# Patient Record
Sex: Female | Born: 1945 | Race: White | Hispanic: No | Marital: Married | Smoking: Never smoker
Health system: Southern US, Community
[De-identification: ages and names within clinical notes are randomized; demographics above are authoritative.]

## PROBLEM LIST (undated history)

## (undated) DIAGNOSIS — G96191 Perineural cyst: Secondary | ICD-10-CM

## (undated) DIAGNOSIS — I10 Essential (primary) hypertension: Secondary | ICD-10-CM

## (undated) DIAGNOSIS — M1990 Unspecified osteoarthritis, unspecified site: Secondary | ICD-10-CM

## (undated) DIAGNOSIS — E119 Type 2 diabetes mellitus without complications: Secondary | ICD-10-CM

## (undated) DIAGNOSIS — M659 Synovitis and tenosynovitis, unspecified: Secondary | ICD-10-CM

## (undated) DIAGNOSIS — K219 Gastro-esophageal reflux disease without esophagitis: Secondary | ICD-10-CM

## (undated) DIAGNOSIS — E0590 Thyrotoxicosis, unspecified without thyrotoxic crisis or storm: Secondary | ICD-10-CM

## (undated) DIAGNOSIS — Z96641 Presence of right artificial hip joint: Secondary | ICD-10-CM

## (undated) DIAGNOSIS — E538 Deficiency of other specified B group vitamins: Secondary | ICD-10-CM

## (undated) DIAGNOSIS — M25551 Pain in right hip: Secondary | ICD-10-CM

## (undated) DIAGNOSIS — M47816 Spondylosis without myelopathy or radiculopathy, lumbar region: Secondary | ICD-10-CM

## (undated) DIAGNOSIS — G548 Other nerve root and plexus disorders: Secondary | ICD-10-CM

## (undated) DIAGNOSIS — G8929 Other chronic pain: Secondary | ICD-10-CM

## (undated) DIAGNOSIS — M65959 Unspecified synovitis and tenosynovitis, unspecified thigh: Secondary | ICD-10-CM

## (undated) DIAGNOSIS — K5900 Constipation, unspecified: Secondary | ICD-10-CM

## (undated) HISTORY — PX: ELBOW SURGERY: SHX618

## (undated) HISTORY — PX: TOTAL HIP ARTHROPLASTY: SHX124

## (undated) HISTORY — PX: OTHER SURGICAL HISTORY: SHX169

## (undated) HISTORY — PX: VAGINAL HYSTERECTOMY: SUR661

## (undated) HISTORY — PX: HIP SURGERY: SHX245

---

## 1998-10-25 ENCOUNTER — Other Ambulatory Visit: Admission: RE | Admit: 1998-10-25 | Discharge: 1998-10-25 | Payer: Self-pay | Admitting: Obstetrics and Gynecology

## 1999-11-02 ENCOUNTER — Other Ambulatory Visit: Admission: RE | Admit: 1999-11-02 | Discharge: 1999-11-02 | Payer: Self-pay | Admitting: Obstetrics and Gynecology

## 2000-10-24 ENCOUNTER — Other Ambulatory Visit: Admission: RE | Admit: 2000-10-24 | Discharge: 2000-10-24 | Payer: Self-pay | Admitting: Obstetrics and Gynecology

## 2002-01-15 ENCOUNTER — Other Ambulatory Visit: Admission: RE | Admit: 2002-01-15 | Discharge: 2002-01-15 | Payer: Self-pay | Admitting: Obstetrics and Gynecology

## 2003-02-08 ENCOUNTER — Other Ambulatory Visit: Admission: RE | Admit: 2003-02-08 | Discharge: 2003-02-08 | Payer: Self-pay | Admitting: Obstetrics and Gynecology

## 2003-10-25 ENCOUNTER — Ambulatory Visit (HOSPITAL_BASED_OUTPATIENT_CLINIC_OR_DEPARTMENT_OTHER): Admission: RE | Admit: 2003-10-25 | Discharge: 2003-10-25 | Payer: Self-pay | Admitting: Orthopedic Surgery

## 2003-10-25 ENCOUNTER — Ambulatory Visit (HOSPITAL_COMMUNITY): Admission: RE | Admit: 2003-10-25 | Discharge: 2003-10-25 | Payer: Self-pay | Admitting: Orthopedic Surgery

## 2004-05-23 ENCOUNTER — Other Ambulatory Visit: Admission: RE | Admit: 2004-05-23 | Discharge: 2004-05-23 | Payer: Self-pay | Admitting: Obstetrics and Gynecology

## 2010-03-03 ENCOUNTER — Emergency Department (HOSPITAL_BASED_OUTPATIENT_CLINIC_OR_DEPARTMENT_OTHER): Admission: EM | Admit: 2010-03-03 | Discharge: 2010-03-04 | Payer: Self-pay | Admitting: Emergency Medicine

## 2010-03-03 ENCOUNTER — Ambulatory Visit: Payer: Self-pay | Admitting: Diagnostic Radiology

## 2011-05-04 NOTE — Op Note (Signed)
Shelby Robinson, Shelby Robinson                           ACCOUNT NO.:  [id number]   MEDICAL RECORD NO.:  [id number]                   PATIENT TYPE:  AMB   LOCATION:  DSC                                  FACILITY:  [facility]   PHYSICIAN:  Robert A. Thurston Hole, M.D.              DATE OF BIRTH:  01-20-1946   DATE OF ADMISSION:  10/25/2003  DATE OF DISCHARGE:                                 OPERATIVE REPORT   PREOPERATIVE DIAGNOSIS:  Right elbow lateral epicondylitis with partial tear  ECRV/ECRL tendon.   POSTOPERATIVE DIAGNOSIS:  Right elbow lateral epicondylitis with partial  tear ECRV/ECRL tendon.   PROCEDURE:  Right elbow EUA followed by lateral epicondylar release,  debridement, and repair with partial epicondylectomy.   SURGEON:  Elana Alm. Thurston Hole, M.D.   ASSISTANT:  Julien Girt, P.A.   ANESTHESIA:  General.   OPERATIVE TIME:  45 minutes.   COMPLICATIONS:  None.   INDICATIONS FOR PROCEDURE:  The patient is a 65 year old woman who has had  significant right elbow pain for the past six to eight months, increasing in  nature, with signs and symptoms and MRI documenting partial tear of the  lateral epicondylar tendons with tendonitis who has failed conservative care  and is now to undergo lateral epicondylar release, debridement, and repair.   DESCRIPTION OF PROCEDURE:  The patient is brought to the operating room on  October 25, 2003, and placed on the operating table in the supine position.  After an adequate level of general anesthesia was obtained, her right elbow  was examined under anesthesia.  She had full range of motion and her elbow  was stable to ligamentous examination.  Her right arm was prepped using  sterile Duraprep and draped using sterile technique.  Her arm was  exsanguinated and a tourniquet elevated to 250 mmHg.  Initially through a 3  cm curvilinear incision based over the lateral epicondyle, initial exposure  was made.  The underlying subcutaneous tissues were  incised in line with the  skin incision.  The fascia over the lateral epicondyle was incised and  retracted.  Lateral epicondylar tendons, ECRV and ECRL tendons were  carefully dissected off the lateral epicondyle with partial tearing noted as  the undersurface was visualized and this was thoroughly debrided back to  healthy tissue.  Radial nerve carefully protected.  The radial head  capitellum joint was not entered.  After debridement was carried out, there  was found to be a small spur along the lateral epicondyle and this was  resected.  Multiple drill holes were then placed in the lateral epicondyle  and then sequentially using 2-0 Fiberwire, the lateral epicondylar tendons  were reattached to the lateral epicondyle approximately 3 to 4 mm distal to  their initial attachment site through these drill holes and the bone and  tied down.  This significantly reduced the tension on the tendons and  performed a satisfactory repair.  After this was done, the wound was  irrigated and the fascia was closed over this with 2-0 Vicryl, subcutaneous  tissues closed with 2-0 Vicryl, subcuticular layer closed with 3-0 Prolene.  Steri-Strips were applied.  The wound injected with 0.25% Marcaine with  epinephrine.  Sterile dressings and a long arm splint applied.  The  tourniquet was released and the patient awakened and taken to the recovery  room in stable condition.   FOLLOW UP:  The patient will be followed as an outpatient on Percocet and  Naprosyn.  See her back in the office in a week for sutures out and follow-  up.                                                Robert A. Thurston Hole, M.D.    RAW/MEDQ  D:  10/25/2003  T:  10/25/2003  Job:  [id number]

## 2013-04-20 ENCOUNTER — Emergency Department (HOSPITAL_BASED_OUTPATIENT_CLINIC_OR_DEPARTMENT_OTHER)
Admission: EM | Admit: 2013-04-20 | Discharge: 2013-04-20 | Disposition: A | Discharge status: Home / Self Care | Payer: Medicare Other | Attending: Emergency Medicine | Admitting: Emergency Medicine

## 2013-04-20 ENCOUNTER — Encounter (HOSPITAL_BASED_OUTPATIENT_CLINIC_OR_DEPARTMENT_OTHER): Payer: Self-pay

## 2013-04-20 DIAGNOSIS — R350 Frequency of micturition: Secondary | ICD-10-CM | POA: Insufficient documentation

## 2013-04-20 DIAGNOSIS — N3090 Cystitis, unspecified without hematuria: Secondary | ICD-10-CM | POA: Insufficient documentation

## 2013-04-20 DIAGNOSIS — E119 Type 2 diabetes mellitus without complications: Secondary | ICD-10-CM | POA: Insufficient documentation

## 2013-04-20 DIAGNOSIS — Z79899 Other long term (current) drug therapy: Secondary | ICD-10-CM | POA: Insufficient documentation

## 2013-04-20 DIAGNOSIS — R3915 Urgency of urination: Secondary | ICD-10-CM | POA: Insufficient documentation

## 2013-04-20 DIAGNOSIS — N3091 Cystitis, unspecified with hematuria: Secondary | ICD-10-CM

## 2013-04-20 DIAGNOSIS — Z87440 Personal history of urinary (tract) infections: Secondary | ICD-10-CM | POA: Insufficient documentation

## 2013-04-20 DIAGNOSIS — I10 Essential (primary) hypertension: Secondary | ICD-10-CM | POA: Insufficient documentation

## 2013-04-20 HISTORY — DX: Essential (primary) hypertension: I10

## 2013-04-20 HISTORY — DX: Type 2 diabetes mellitus without complications: E11.9

## 2013-04-20 LAB — URINALYSIS, ROUTINE W REFLEX MICROSCOPIC
Ketones, ur: NEGATIVE mg/dL
Protein, ur: 30 mg/dL — AB
pH: 6.5 (ref 5.0–8.0)

## 2013-04-20 LAB — URINE MICROSCOPIC-ADD ON

## 2013-04-20 MED ORDER — CIPROFLOXACIN HCL 500 MG PO TABS: 500.0000 mg | ORAL_TABLET | Freq: Two times a day (BID) | ORAL | Status: DC

## 2013-04-20 MED ORDER — PHENAZOPYRIDINE HCL 100 MG PO TABS
200.0000 mg | ORAL_TABLET | Freq: Once | ORAL | Status: AC
  Administered 2013-04-20: 100 mg via ORAL
  Filled 2013-04-20: qty 1

## 2013-04-20 MED ORDER — PHENAZOPYRIDINE HCL 200 MG PO TABS: 200.0000 mg | ORAL_TABLET | Freq: Three times a day (TID) | ORAL | Status: DC

## 2013-04-20 MED ORDER — CIPROFLOXACIN HCL 500 MG PO TABS
500.0000 mg | ORAL_TABLET | Freq: Once | ORAL | Status: AC
  Administered 2013-04-20: 500 mg via ORAL
  Filled 2013-04-20: qty 1

## 2013-04-20 NOTE — ED Provider Notes (Signed)
History     CSN: [id number]  Arrival date & time 04/20/13  0325   First MD Initiated Contact with Patient 04/20/13 0401      Chief Complaint  Patient presents with  . Dysuria    (Consider location/radiation/quality/duration/timing/severity/associated sxs/prior treatment) HPI This is a 67 year old female with urinary tract infection symptoms that began this morning about midnight. Specifically she is having urinary urgency, urinary frequency and burning with urination. The symptoms are moderate in severity. She has not taken anything to treat the symptoms. She denies fever, chills, nausea or vomiting. She has had hematuria and back pain. She has had urinary tract infections in the past but never with this amount of hematuria.  Past Medical History  Diagnosis Date  . Diabetes mellitus without complication   . UTI (lower urinary tract infection)   . Hypertension     Past Surgical History  Procedure Laterality Date  . Hip surgery      No family history on file.  History  Substance Use Topics  . Smoking status: Never Smoker   . Smokeless tobacco: Not on file  . Alcohol Use: Not on file    OB History   Grav Para Term Preterm Abortions TAB SAB Ect Mult Living                  Review of Systems  All other systems reviewed and are negative.    Allergies  Flagyl; Codeine; and Other  Home Medications   Current Outpatient Rx  Name  Route  Sig  Dispense  Refill  . amLODipine (NORVASC) 2.5 MG tablet   Oral   Take 2.5 mg by mouth daily.         Marland Kitchen atorvastatin (LIPITOR) 10 MG tablet   Oral   Take 10 mg by mouth daily.         . famotidine (PEPCID) 40 MG tablet   Oral   Take 40 mg by mouth daily.         . hydrochlorothiazide (HYDRODIURIL) 25 MG tablet   Oral   Take 25 mg by mouth daily.         . lansoprazole (PREVACID) 30 MG capsule   Oral   Take 30 mg by mouth daily.         . metFORMIN (GLUMETZA) 500 MG (MOD) 24 hr tablet   Oral   Take 500 mg  by mouth 2 (two) times daily with a meal.         . metoprolol (TOPROL-XL) 200 MG 24 hr tablet   Oral   Take 200 mg by mouth daily.         Marland Kitchen rOPINIRole (REQUIP) 0.25 MG tablet   Oral   Take 0.25 mg by mouth 3 (three) times daily.         . traMADol (ULTRAM) 50 MG tablet   Oral   Take 50 mg by mouth every 6 (six) hours as needed for pain.           BP 121/86  Pulse 79  Temp(Src) 97.9 F (36.6 C) (Oral)  SpO2 95%  Physical Exam General: Well-developed, well-nourished female in no acute distress; appearance consistent with age of record HENT: normocephalic, atraumatic Eyes: pupils equal round and reactive to light; extraocular muscles intact Neck: supple Heart: regular rate and rhythm Lungs: clear to auscultation bilaterally Abdomen: soft; nondistended; mild suprapubic tenderness; bowel sounds present GU: Mild CVA tenderness bilaterally Extremities: No deformity; full range of motion; pulses normal  Neurologic: Awake, alert and oriented; motor function intact in all extremities and symmetric; no facial droop Skin: Warm and dry Psychiatric: Normal mood and affect    ED Course  Procedures (including critical care time)     MDM   Nursing notes and vitals signs, including pulse oximetry, reviewed.  Summary of this visit's results, reviewed by myself:  Labs:  Results for orders placed during the hospital encounter of 04/20/13 (from the past 24 hour(s))  URINALYSIS, ROUTINE W REFLEX MICROSCOPIC     Status: Abnormal   Collection Time    04/20/13  3:35 AM      Result Value Range   Color, Urine YELLOW  YELLOW   APPearance CLOUDY (*) CLEAR   Specific Gravity, Urine 1.004 (*) 1.005 - 1.030   pH 6.5  5.0 - 8.0   Glucose, UA NEGATIVE  NEGATIVE mg/dL   Hgb urine dipstick LARGE (*) NEGATIVE   Bilirubin Urine NEGATIVE  NEGATIVE   Ketones, ur NEGATIVE  NEGATIVE mg/dL   Protein, ur 30 (*) NEGATIVE mg/dL   Urobilinogen, UA 0.2  0.0 - 1.0 mg/dL   Nitrite NEGATIVE   NEGATIVE   Leukocytes, UA LARGE (*) NEGATIVE  URINE MICROSCOPIC-ADD ON     Status: Abnormal   Collection Time    04/20/13  3:35 AM      Result Value Range   Squamous Epithelial / LPF FEW (*) RARE   WBC, UA TOO NUMEROUS TO COUNT  <3 WBC/hpf   RBC / HPF TOO NUMEROUS TO COUNT  <3 RBC/hpf   Bacteria, UA MANY (*) RARE             Hanley Seamen, MD 04/20/13 [phone]

## 2013-04-20 NOTE — ED Notes (Signed)
Patient states that she developed urinary urgency, pressure and hematuria at midnight tonight. Has not felt well the past 2 days but bladder pain did not start until today, hx of same. Also complains of lower backpain

## 2013-04-20 NOTE — ED Notes (Signed)
MD at bedside. 

## 2013-04-22 LAB — URINE CULTURE: Colony Count: >=100000

## 2013-04-23 NOTE — ED Notes (Signed)
Post ED Visit - Positive Culture Follow-up  Culture report reviewed by antimicrobial stewardship pharmacist: []  Wes Dulaney, Pharm.D., BCPS [x]  Celedonio Miyamoto, Pharm.D., BCPS []  Georgina Pillion, Pharm.D., BCPS []  [place], [address].D., BCPS, AAHIVP []  Estella Husk, Pharm.D., BCPS, AAHIV  Positive urine culture Treated with cipro- organism sensitive to the same and no further patient follow-up is required at this time.  Larena Sox 04/23/2013, 6:36 PM

## 2015-02-16 ENCOUNTER — Encounter (HOSPITAL_BASED_OUTPATIENT_CLINIC_OR_DEPARTMENT_OTHER): Payer: Self-pay | Admitting: *Deleted

## 2015-02-16 ENCOUNTER — Emergency Department (HOSPITAL_BASED_OUTPATIENT_CLINIC_OR_DEPARTMENT_OTHER): Payer: Medicare Other

## 2015-02-16 ENCOUNTER — Emergency Department (HOSPITAL_BASED_OUTPATIENT_CLINIC_OR_DEPARTMENT_OTHER)
Admission: EM | Admit: 2015-02-16 | Discharge: 2015-02-16 | Disposition: A | Discharge status: Home / Self Care | Payer: Medicare Other | Attending: Emergency Medicine | Admitting: Emergency Medicine

## 2015-02-16 DIAGNOSIS — D72829 Elevated white blood cell count, unspecified: Secondary | ICD-10-CM | POA: Diagnosis not present

## 2015-02-16 DIAGNOSIS — E119 Type 2 diabetes mellitus without complications: Secondary | ICD-10-CM | POA: Diagnosis not present

## 2015-02-16 DIAGNOSIS — Z79899 Other long term (current) drug therapy: Secondary | ICD-10-CM | POA: Insufficient documentation

## 2015-02-16 DIAGNOSIS — K5900 Constipation, unspecified: Secondary | ICD-10-CM | POA: Diagnosis not present

## 2015-02-16 DIAGNOSIS — R1030 Lower abdominal pain, unspecified: Secondary | ICD-10-CM

## 2015-02-16 DIAGNOSIS — Z87440 Personal history of urinary (tract) infections: Secondary | ICD-10-CM | POA: Insufficient documentation

## 2015-02-16 DIAGNOSIS — I10 Essential (primary) hypertension: Secondary | ICD-10-CM | POA: Diagnosis not present

## 2015-02-16 DIAGNOSIS — R339 Retention of urine, unspecified: Secondary | ICD-10-CM | POA: Diagnosis present

## 2015-02-16 LAB — CBC WITH DIFFERENTIAL/PLATELET
BASOS ABS: 0.1 10*3/uL (ref 0.0–0.1)
BASOS PCT: 0 % (ref 0–1)
EOS ABS: 0 10*3/uL (ref 0.0–0.7)
EOS PCT: 0 % (ref 0–5)
HCT: 43.2 % (ref 36.0–46.0)
Hemoglobin: 14.4 g/dL (ref 12.0–15.0)
LYMPHS ABS: 2.9 10*3/uL (ref 0.7–4.0)
Lymphocytes Relative: 13 % (ref 12–46)
MCH: 29.4 pg (ref 26.0–34.0)
MCHC: 33.3 g/dL (ref 30.0–36.0)
MCV: 88.2 fL (ref 78.0–100.0)
Monocytes Absolute: 1.2 10*3/uL — ABNORMAL HIGH (ref 0.1–1.0)
Monocytes Relative: 6 % (ref 3–12)
NEUTROS PCT: 81 % — AB (ref 43–77)
Neutro Abs: 17.8 10*3/uL — ABNORMAL HIGH (ref 1.7–7.7)
PLATELETS: 374 10*3/uL (ref 150–400)
RBC: 4.9 MIL/uL (ref 3.87–5.11)
RDW: 13.6 % (ref 11.5–15.5)
WBC: 22 10*3/uL — ABNORMAL HIGH (ref 4.0–10.5)

## 2015-02-16 LAB — URINALYSIS, ROUTINE W REFLEX MICROSCOPIC
Bilirubin Urine: NEGATIVE
GLUCOSE, UA: NEGATIVE mg/dL
Hgb urine dipstick: NEGATIVE
KETONES UR: NEGATIVE mg/dL
LEUKOCYTES UA: NEGATIVE
Nitrite: NEGATIVE
PROTEIN: NEGATIVE mg/dL
Specific Gravity, Urine: 1.014 (ref 1.005–1.030)
Urobilinogen, UA: 1 mg/dL (ref 0.0–1.0)
pH: 6.5 (ref 5.0–8.0)

## 2015-02-16 LAB — COMPREHENSIVE METABOLIC PANEL
ALT: 27 U/L (ref 0–35)
AST: 35 U/L (ref 0–37)
Albumin: 5 g/dL (ref 3.5–5.2)
Alkaline Phosphatase: 103 U/L (ref 39–117)
Anion gap: 10 (ref 5–15)
BILIRUBIN TOTAL: 0.7 mg/dL (ref 0.3–1.2)
BUN: 13 mg/dL (ref 6–23)
CALCIUM: 9.4 mg/dL (ref 8.4–10.5)
CO2: 24 mmol/L (ref 19–32)
Chloride: 102 mmol/L (ref 96–112)
Creatinine, Ser: 0.78 mg/dL (ref 0.50–1.10)
GFR calc Af Amer: >90 mL/min (ref >90)
GFR calc non Af Amer: 83 mL/min — ABNORMAL LOW (ref >90)
GLUCOSE: 137 mg/dL — AB (ref 70–99)
Potassium: 2.9 mmol/L — ABNORMAL LOW (ref 3.5–5.1)
SODIUM: 136 mmol/L (ref 135–145)
TOTAL PROTEIN: 7.7 g/dL (ref 6.0–8.3)

## 2015-02-16 LAB — LIPASE, BLOOD: Lipase: 40 U/L (ref 11–59)

## 2015-02-16 MED ORDER — PHENAZOPYRIDINE HCL 100 MG PO TABS
95.0000 mg | ORAL_TABLET | Freq: Once | ORAL | Status: AC
  Administered 2015-02-16: 100 mg via ORAL
  Filled 2015-02-16: qty 1

## 2015-02-16 MED ORDER — IOHEXOL 300 MG/ML  SOLN
50.0000 mL | Freq: Once | INTRAMUSCULAR | Status: AC | PRN
  Administered 2015-02-16: 50 mL via ORAL

## 2015-02-16 MED ORDER — IOHEXOL 300 MG/ML  SOLN
100.0000 mL | Freq: Once | INTRAMUSCULAR | Status: AC | PRN
  Administered 2015-02-16: 100 mL via INTRAVENOUS

## 2015-02-16 MED ORDER — POTASSIUM CHLORIDE CRYS ER 20 MEQ PO TBCR: 20.0000 meq | EXTENDED_RELEASE_TABLET | Freq: Every day | ORAL | Status: DC

## 2015-02-16 MED ORDER — TRAMADOL HCL 50 MG PO TABS
100.0000 mg | ORAL_TABLET | Freq: Once | ORAL | Status: AC
  Administered 2015-02-16: 100 mg via ORAL
  Filled 2015-02-16: qty 2

## 2015-02-16 NOTE — ED Provider Notes (Signed)
CSN: [id number]     Arrival date & time 02/16/15  1230 History   First MD Initiated Contact with Patient 02/16/15 1240     Chief Complaint  Patient presents with  . unable to void      (Consider location/radiation/quality/duration/timing/severity/associated sxs/prior Treatment) HPI  The patient reports that at 7 AM she was only able to dribble a small amount of urine. She continued to have increased sensation of suprapubic pressure and urgency to urinate. She was unable to go anymore and the pressure and pain increased X potentially over the subsequent hours. Patient denies that she developed any vomiting or diarrhea. She did not have associated fever. She's had problems with constipation in the past. She tried an enema to see if that were relieved all of the pressure and pain that she was having however it did not help. She has had a problem of urinary retention a couple of times since having a mesh placed in 2006. She reports her urethra is slightly kinked and twice before she's had to have a catheter to relieve the urine. Past Medical History  Diagnosis Date  . Diabetes mellitus without complication   . UTI (lower urinary tract infection)   . Hypertension    Past Surgical History  Procedure Laterality Date  . Hip surgery     History reviewed. No pertinent family history. History  Substance Use Topics  . Smoking status: Never Smoker   . Smokeless tobacco: Not on file  . Alcohol Use: Not on file   OB History    No data available     Review of Systems 10 Systems reviewed and are negative for acute change except as noted in the HPI.    Allergies  Flagyl; Ace inhibitors; Angiotensin receptor blockers; Codeine; Nalbuphine hcl; Nickel; and Other  Home Medications   Prior to Admission medications   Medication Sig Start Date End Date Taking? Authorizing Provider  amLODipine (NORVASC) 2.5 MG tablet Take 2.5 mg by mouth daily.    Historical Provider, MD  atorvastatin (LIPITOR) 10  MG tablet Take 10 mg by mouth daily.    Historical Provider, MD  ciprofloxacin (CIPRO) 500 MG tablet Take 1 tablet (500 mg total) by mouth 2 (two) times daily. One po bid x 7 days 04/20/13   Carlisle Beers Molpus, MD  famotidine (PEPCID) 40 MG tablet Take 40 mg by mouth daily.    Historical Provider, MD  hydrochlorothiazide (HYDRODIURIL) 25 MG tablet Take 25 mg by mouth daily.    Historical Provider, MD  lansoprazole (PREVACID) 30 MG capsule Take 30 mg by mouth daily.    Historical Provider, MD  metFORMIN (GLUMETZA) 500 MG (MOD) 24 hr tablet Take 500 mg by mouth 2 (two) times daily with a meal.    Historical Provider, MD  metoprolol (TOPROL-XL) 200 MG 24 hr tablet Take 200 mg by mouth daily.    Historical Provider, MD  phenazopyridine (PYRIDIUM) 200 MG tablet Take 1 tablet (200 mg total) by mouth 3 (three) times daily. 04/20/13   John L Molpus, MD  rOPINIRole (REQUIP) 0.25 MG tablet Take 0.25 mg by mouth 3 (three) times daily.    Historical Provider, MD  traMADol (ULTRAM) 50 MG tablet Take 50 mg by mouth every 6 (six) hours as needed for pain.    Historical Provider, MD   BP 148/86 mmHg  Pulse 89  Temp(Src) 97.9 F (36.6 C) (Oral)  Resp 18  Ht  (1.626 m)  Wt 162 lb (73.483 kg)  BMI 27.79 kg/m2  SpO2 100% Physical Exam  Constitutional: She is oriented to person, place, and time. She appears well-developed and well-nourished.  The patient is alert and nontoxic but she appears to be in pain.  HENT:  Head: Normocephalic and atraumatic.  Eyes: EOM are normal. Pupils are equal, round, and reactive to light.  Neck: Neck supple.  Cardiovascular: Normal rate, regular rhythm, normal heart sounds and intact distal pulses.   Pulmonary/Chest: Effort normal and breath sounds normal.  Abdominal: Soft. Bowel sounds are normal. She exhibits no distension. There is tenderness.  Suprapubic pain to palpation. No guarding.  Musculoskeletal: Normal range of motion. She exhibits no edema.  Neurological: She is  alert and oriented to person, place, and time. She has normal strength. Coordination normal. GCS eye subscore is 4. GCS verbal subscore is 5. GCS motor subscore is 6.  Skin: Skin is warm, dry and intact.  Psychiatric: She has a normal mood and affect.    ED Course  Procedures (including critical care time) Labs Review Labs Reviewed  COMPREHENSIVE METABOLIC PANEL - Abnormal; Notable for the following:    Potassium 2.9 (*)    Glucose, Bld 137 (*)    GFR calc non Af Amer 83 (*)    All other components within normal limits  CBC WITH DIFFERENTIAL/PLATELET - Abnormal; Notable for the following:    WBC 22.0 (*)    Neutrophils Relative % 81 (*)    Neutro Abs 17.8 (*)    Monocytes Absolute 1.2 (*)    All other components within normal limits  URINALYSIS, ROUTINE W REFLEX MICROSCOPIC  LIPASE, BLOOD    Imaging Review No results found.   EKG Interpretation None     Foley catheter placement put out 250 mL of urine. MDM   Final diagnoses:  Lower abdominal pain  Leukocytosis  Constipation, unspecified constipation type   Patient does have elevated white blood cell count. She has put out a lot of stool. Reexamination shows her to feel improved after bowel movement however to palpation she does have some residual left lower quadrant pain. At this point CT scan will be obtained to further assess for other possible etiologies. Dr. Criss AlvineGoldston will follow-up on CT results and final disposition for patient depending upon results and patient condition.    Arby BarretteMarcy Jenaye Rickert, MD 02/16/15 [phone]

## 2015-02-16 NOTE — ED Notes (Signed)
Pt amb to room 4 with slow, steady gait. Pt reports last void was "a little dribble" at 7am, and has been unable to void since then. Pt reports chronic constipation, gave herself an enema this morning also, with no results per pt. Pt reports history of difficulty voiding since mesh placement in 2006. Denies any fevers, n/v or other c/o.

## 2015-02-16 NOTE — Discharge Instructions (Signed)
Abdominal Pain °Many things can cause abdominal pain. Usually, abdominal pain is not caused by a disease and will improve without treatment. It can often be observed and treated at home. Your health care provider will do a physical exam and possibly order blood tests and X-rays to help determine the seriousness of your pain. However, in many cases, more time must pass before a clear cause of the pain can be found. Before that point, your health care provider may not know if you need more testing or further treatment. °HOME CARE INSTRUCTIONS  °Monitor your abdominal pain for any changes. The following actions may help to alleviate any discomfort you are experiencing: °· Only take over-the-counter or prescription medicines as directed by your health care provider. °· Do not take laxatives unless directed to do so by your health care provider. °· Try a clear liquid diet (broth, tea, or water) as directed by your health care provider. Slowly move to a bland diet as tolerated. °SEEK MEDICAL CARE IF: °· You have unexplained abdominal pain. °· You have abdominal pain associated with nausea or diarrhea. °· You have pain when you urinate or have a bowel movement. °· You experience abdominal pain that wakes you in the night. °· You have abdominal pain that is worsened or improved by eating food. °· You have abdominal pain that is worsened with eating fatty foods. °· You have a fever. °SEEK IMMEDIATE MEDICAL CARE IF:  °· Your pain does not go away within 2 hours. °· You keep throwing up (vomiting). °· Your pain is felt only in portions of the abdomen, such as the right side or the left lower portion of the abdomen. °· You pass bloody or black tarry stools. °MAKE SURE YOU: °· Understand these instructions.   °· Will watch your condition.   °· Will get help right away if you are not doing well or get worse.   °Document Released: 09/12/2005 Document Revised: 12/08/2013 Document Reviewed: 08/12/2013 °Exit[employer]® Patient Information  ©2015 [employer]. This information is not intended to replace advice given to you by your health care provider. Make sure you discuss any questions you have with your health care provider. ° °Hypokalemia °Hypokalemia means that the amount of potassium in the blood is lower than normal. Potassium is a chemical, called an electrolyte, that helps regulate the amount of fluid in the body. It also stimulates muscle contraction and helps nerves function properly. Most of the body's potassium is inside of cells, and only a very small amount is in the blood. Because the amount in the blood is so small, minor changes can be life-threatening. °CAUSES °· Antibiotics. °· Diarrhea or vomiting. °· Using laxatives too much, which can cause diarrhea. °· Chronic kidney disease. °· Water pills (diuretics). °· Eating disorders (bulimia). °· Low magnesium level. °· Sweating a lot. °SIGNS AND SYMPTOMS °· Weakness. °· Constipation. °· Fatigue. °· Muscle cramps. °· Mental confusion. °· Skipped heartbeats or irregular heartbeat (palpitations). °· Tingling or numbness. °DIAGNOSIS  °Your health care provider can diagnose hypokalemia with blood tests. In addition to checking your potassium level, your health care provider may also check other lab tests. °TREATMENT °Hypokalemia can be treated with potassium supplements taken by mouth or adjustments in your current medicines. If your potassium level is very low, you may need to get potassium through a vein (IV) and be monitored in the hospital. A diet high in potassium is also helpful. Foods high in potassium are: °· Nuts, such as peanuts and pistachios. °· Seeds, such as sunflower   seeds and pumpkin seeds. °· Peas, lentils, and lima beans. °· Whole grain and bran cereals and breads. °· Fresh fruit and vegetables, such as apricots, avocado, bananas, cantaloupe, kiwi, oranges, tomatoes, asparagus, and potatoes. °· Orange and tomato juices. °· Red meats. °· Fruit yogurt. °HOME CARE  INSTRUCTIONS °· Take all medicines as prescribed by your health care provider. °· Maintain a healthy diet by including nutritious food, such as fruits, vegetables, nuts, whole grains, and lean meats. °· If you are taking a laxative, be sure to follow the directions on the label. °SEEK MEDICAL CARE IF: °· Your weakness gets worse. °· You feel your heart pounding or racing. °· You are vomiting or having diarrhea. °· You are diabetic and having trouble keeping your blood glucose in the normal range. °SEEK IMMEDIATE MEDICAL CARE IF: °· You have chest pain, shortness of breath, or dizziness. °· You are vomiting or having diarrhea for more than 2 days. °· You faint. °MAKE SURE YOU:  °· Understand these instructions. °· Will watch your condition. °· Will get help right away if you are not doing well or get worse. °Document Released: 12/03/2005 Document Revised: 09/23/2013 Document Reviewed: 06/05/2013 °ExitCare® Patient Information ©2015 [employer]. This information is not intended to replace advice given to you by your health care provider. Make sure you discuss any questions you have with your health care provider. ° °

## 2015-02-16 NOTE — ED Provider Notes (Signed)
Patient CT scan shows no acute abdomen is. Patient states she knew about the renal cyst seen on CT. Discussed treatment for hypokalemia as well as return precautions. This point there is no emergent indication for further testing or admission. Given she feels much better after having large amounts of bowel movements and is likely related to that. Will discharge home.  Audree CamelScott T Sejal Cofield, MD 02/16/15 Rickey [id number]

## 2016-08-30 ENCOUNTER — Telehealth: Payer: Self-pay | Admitting: Cardiology

## 2016-08-30 NOTE — Telephone Encounter (Signed)
[facility] appointment with Dr Antoine PocheHochrein on 10/01/16.  [facility] Dr Hochrein's schedule on 10/01/16. lp

## 2016-09-14 ENCOUNTER — Encounter: Payer: Self-pay | Admitting: Cardiology

## 2016-09-30 NOTE — Progress Notes (Signed)
Cardiology Office Note   Date:  10/01/2016   ID:  Shelby SimaGloria Delman, DOB 05/05/1946, MRN [id number]  PCP:  Angelica ChessmanAGUIAR,RAFAELA M., MD  Cardiologist:   Rollene RotundaJames Trinetta Alemu, MD  Referring:  Dr. Riley NearingAguiar  Chief Complaint  Patient presents with  . Aortic Regurgitation  . Mitral Regurgitation      History of Present Illness: Shelby Robinson is a 70 y.o. female who presents for evaluation of valve disease. She reports mitral regurgitation and aortic insufficiency on echo couple of years ago prior to hip surgery although I cannot find these results even looking through the chart in Care Everywhere.  She also reports having a previous stress test. She denies any cardiovascular symptoms. Since having both hips replaced she's been able to be more active although she can't really exercise very much but she does get some dyspnea when she tries to use that she is at the gym. However, she can do yard work. With this she denies any cardiovascular symptoms. The patient denies any new symptoms such as chest discomfort, neck or arm discomfort. There has been no new shortness of breath, PND or orthopnea. There have been no reported palpitations, presyncope or syncope.   Past Medical History:  Diagnosis Date  . Diabetes mellitus without complication (HCC)   . Hypertension     Past Surgical History:  Procedure Laterality Date  . cataract    . ELBOW SURGERY Right   . HIP SURGERY     Bilateral THR  . VAGINAL HYSTERECTOMY       Current Outpatient Prescriptions  Medication Sig Dispense Refill  . 5-Hydroxytryptophan (5-HTP) 100 MG CAPS Take 1 capsule by mouth daily.    Marland Kitchen. aspirin 81 MG chewable tablet Chew 1 tablet by mouth daily.    Marland Kitchen. atorvastatin (LIPITOR) 10 MG tablet Take 10 mg by mouth daily.    . Biotin 2500 MCG CAPS Take 1 capsule by mouth daily.    . Cholecalciferol (D3-1000) 1000 units capsule Take 1,000 Units by mouth daily.    . Coenzyme Q10 (CO Q 10) 100 MG CAPS Take 1 capsule by mouth daily.    .  Cyanocobalamin (B-12) 2500 MCG TABS Take 1 tablet by mouth daily.    . cyclobenzaprine (FLEXERIL) 10 MG tablet Take 1 tablet by mouth daily as needed.    . famotidine (PEPCID) 40 MG tablet Take 40 mg by mouth daily.    . folic acid (FOLVITE) 400 MCG tablet Take 400 mcg by mouth daily.    . Magnesium 250 MG TABS Take 1 tablet by mouth daily.    . meloxicam (MOBIC) 7.5 MG tablet Take 1 tablet by mouth daily as needed.    . metFORMIN (GLUMETZA) 500 MG (MOD) 24 hr tablet Take 500 mg by mouth 2 (two) times daily with a meal.    . metoprolol (TOPROL-XL) 200 MG 24 hr tablet Take 200 mg by mouth daily.    . ranitidine (ZANTAC) 300 MG tablet Take 1 tablet by mouth daily.    Marland Kitchen. rOPINIRole (REQUIP) 0.25 MG tablet Take 0.25 mg by mouth 3 (three) times daily.    Marland Kitchen. amLODipine (NORVASC) 2.5 MG tablet Take 1 tablet (2.5 mg total) by mouth daily. 90 tablet 3   No current facility-administered medications for this visit.     Allergies:   Flagyl [metronidazole]; Ace inhibitors; Angiotensin receptor blockers; Codeine; Nalbuphine hcl; Nickel; and Other    Social History:  The patient  reports that she has never smoked. She has never  used smokeless tobacco. She reports that she does not drink alcohol or use drugs.   Family History:  The patient's family history includes Alzheimer's disease in her mother; CAD (age of onset: 73) in her father; Coarctation of the aorta in her daughter; Stroke in her father.    ROS:  Please see the history of present illness.   Otherwise, review of systems are positive for none.   All other systems are reviewed and negative.    PHYSICAL EXAM: VS:  BP (!) 152/98   Pulse 70   Ht 5\' 4"  (1.626 m)   Wt 160 lb (72.6 kg)   BMI 27.46 kg/m  , BMI Body mass index is 27.46 kg/m. GENERAL:  Well appearing HEENT:  Pupils equal round and reactive, fundi not visualized, oral mucosa unremarkable NECK:  No jugular venous distention, waveform within normal limits, carotid upstroke brisk and  symmetric, no bruits, no thyromegaly LYMPHATICS:  No cervical, inguinal adenopathy LUNGS:  Clear to auscultation bilaterally BACK:  No CVA tenderness CHEST:  Unremarkable HEART:  PMI not displaced or sustained,S1 and S2 within normal limits, no S3, no S4, no clicks, no rubs, no murmurs ABD:  Flat, positive bowel sounds normal in frequency in pitch, no bruits, no rebound, no guarding, no midline pulsatile mass, no hepatomegaly, no splenomegaly EXT:  2 plus pulses throughout, no edema, no cyanosis no clubbing SKIN:  No rashes no nodules NEURO:  Cranial nerves II through XII grossly intact, motor grossly intact throughout PSYCH:  Cognitively intact, oriented to person place and time    EKG:  EKG is ordered today. The ekg ordered today demonstrates sinus rhythm, rate 70, axis within normal limits, intervals within normal limits, poor anterior R wave progression, nonspecific T-wave flattening.   Recent Labs: No results found for requested labs within last 8760 hours.    Lipid Panel No results found for: CHOL, TRIG, HDL, CHOLHDL, VLDL, LDLCALC, LDLDIRECT    Wt Readings from Last 3 Encounters:  10/01/16 160 lb (72.6 kg)  02/16/15 162 lb (73.5 kg)      Other studies Reviewed: Additional studies/ records that were reviewed today include: Office records. Review of the above records demonstrates:  Please see elsewhere in the note.     ASSESSMENT AND PLAN:  MR/AI:  I suspect these are mild. I don't appreciate any murmurs. I will check an echocardiogram. Further evaluation will be based on this study.  HTN:  Her blood pressure is elevated. She was on Norvasc in the past and not sure why this was stopped. She reports having systolic readings in the 170s at home. She's going to keep her blood pressure diary but immediately start amlodipine 2.5 mg daily.  RESTLESS LEGS:   She does report being bothered by this and giving her the name for possible referral.   Current medicines are  reviewed at length with the patient today.  The patient does not have concerns regarding medicines.  The following changes have been made:  no change  Labs/ tests ordered today include:   Orders Placed This Encounter  Procedures  . ECHOCARDIOGRAM COMPLETE     Disposition:   FU with 4 months.     Signed, Rollene Rotunda, MD  10/01/2016 12:52 PM    [facility]

## 2016-10-01 ENCOUNTER — Ambulatory Visit (INDEPENDENT_AMBULATORY_CARE_PROVIDER_SITE_OTHER): Payer: Medicare Other | Admitting: Cardiology

## 2016-10-01 ENCOUNTER — Encounter: Payer: Self-pay | Admitting: Cardiology

## 2016-10-01 VITALS — BP 152/98 | HR 70 | Ht 64.0 in | Wt 160.0 lb

## 2016-10-01 DIAGNOSIS — I351 Nonrheumatic aortic (valve) insufficiency: Secondary | ICD-10-CM | POA: Insufficient documentation

## 2016-10-01 DIAGNOSIS — I340 Nonrheumatic mitral (valve) insufficiency: Secondary | ICD-10-CM | POA: Diagnosis not present

## 2016-10-01 MED ORDER — AMLODIPINE BESYLATE 2.5 MG PO TABS: 2.5000 mg | ORAL_TABLET | Freq: Every day | ORAL | 3 refills | Status: DC

## 2016-10-01 NOTE — Patient Instructions (Addendum)
Medication Instructions:  START- Amlodipine 2.5 mg daily   Labwork: None Ordered  Testing/Procedures: Your physician has requested that you have an echocardiogram this is done at [facility] [address] [facility]. Echocardiography is a painless test that uses sound waves to create images of your heart. It provides your doctor with information about the size and shape of your heart and how well your heart's chambers and valves are working. This procedure takes approximately one hour. There are no restrictions for this procedure.  Follow-Up: Your physician recommends that you schedule a follow-up appointment in: 4 Months   Any Other Special Instructions Will Be Listed Below (If Applicable).  Dohmeier Porfirio Mylararmen MD  Address: [address], [place], [place] [address]  Hours:  Open today  7AM-5PM                       Phone: [phone]  If you need a refill on your cardiac medications before your next appointment, please call your pharmacy.

## 2016-10-09 NOTE — Addendum Note (Signed)
Addended by: Lorain ChildesPRESSLEY, Thais Silberstein A on: 10/09/2016 05:06 PM   Modules accepted: Orders

## 2016-10-18 ENCOUNTER — Ambulatory Visit (HOSPITAL_COMMUNITY): Payer: Medicare Other | Attending: Cardiology

## 2016-10-18 ENCOUNTER — Other Ambulatory Visit: Payer: Self-pay

## 2016-10-18 DIAGNOSIS — I340 Nonrheumatic mitral (valve) insufficiency: Secondary | ICD-10-CM

## 2016-10-18 DIAGNOSIS — E119 Type 2 diabetes mellitus without complications: Secondary | ICD-10-CM | POA: Insufficient documentation

## 2016-10-18 DIAGNOSIS — I351 Nonrheumatic aortic (valve) insufficiency: Secondary | ICD-10-CM

## 2016-10-18 DIAGNOSIS — I10 Essential (primary) hypertension: Secondary | ICD-10-CM | POA: Insufficient documentation

## 2016-11-05 ENCOUNTER — Other Ambulatory Visit (HOSPITAL_COMMUNITY): Payer: Self-pay | Admitting: Orthopedic Surgery

## 2016-11-05 DIAGNOSIS — M25551 Pain in right hip: Secondary | ICD-10-CM

## 2016-11-15 ENCOUNTER — Encounter: Payer: Self-pay | Admitting: Cardiology

## 2016-11-21 ENCOUNTER — Encounter: Payer: Self-pay | Admitting: Cardiology

## 2016-11-21 ENCOUNTER — Encounter (HOSPITAL_COMMUNITY)
Admission: RE | Admit: 2016-11-21 | Discharge: 2016-11-21 | Disposition: A | Discharge status: Home / Self Care | Payer: Medicare Other | Source: Ambulatory Visit | Attending: Orthopedic Surgery | Admitting: Orthopedic Surgery

## 2016-11-21 DIAGNOSIS — M25551 Pain in right hip: Secondary | ICD-10-CM | POA: Diagnosis not present

## 2016-11-21 MED ORDER — TECHNETIUM TC 99M MEDRONATE IV KIT
20.6000 | PACK | Freq: Once | INTRAVENOUS | Status: AC | PRN
  Administered 2016-11-21: 20.6 via INTRAVENOUS

## 2017-01-31 NOTE — Progress Notes (Signed)
Cardiology Office Note   Date:  02/03/2017   ID:  Shelby SimaGloria Harland, DOB 1946/12/02, MRN [id number]  PCP:  Angelica ChessmanAGUIAR,RAFAELA M., MD  Cardiologist:   Rollene RotundaJames Ayonna Speranza, MD  Referring:  Dr. Riley NearingAguiar  Chief Complaint  Patient presents with  . Aortic Regurgitation      History of Present Illness: Shelby Robinson is a 71 y.o. female who presents for evaluation of valve disease. She had a reported history of mitral regurgitation and aortic insufficiency on echo prior to my first appt with her.  I ordered an echo in Nov which showed mild AI and NL LV EF.  She returns for follow up. She has done well from a cardiac standpoint.  However, she has hip and back pain.  She is somewhat limited by this.  The patient denies any new symptoms such as chest discomfort, neck or arm discomfort. There has been no new shortness of breath, PND or orthopnea. There have been no reported palpitations, presyncope or syncope.   Past Medical History:  Diagnosis Date  . Diabetes mellitus without complication (HCC)   . Hypertension     Past Surgical History:  Procedure Laterality Date  . cataract    . ELBOW SURGERY Right   . HIP SURGERY     Bilateral THR  . VAGINAL HYSTERECTOMY       Current Outpatient Prescriptions  Medication Sig Dispense Refill  . 5-Hydroxytryptophan (5-HTP) 100 MG CAPS Take 1 capsule by mouth daily.    Marland Kitchen. amLODipine (NORVASC) 2.5 MG tablet Take 2.5 mg by mouth daily.    Marland Kitchen. aspirin 81 MG chewable tablet Chew 1 tablet by mouth daily.    Marland Kitchen. atorvastatin (LIPITOR) 10 MG tablet Take 10 mg by mouth daily.    . Biotin 2500 MCG CAPS Take 1 capsule by mouth daily.    . Coenzyme Q10 (CO Q 10) 100 MG CAPS Take 1 capsule by mouth daily.    . Cyanocobalamin (B-12) 2500 MCG TABS Take 1 tablet by mouth daily.    . cyclobenzaprine (FLEXERIL) 10 MG tablet Take 1 tablet by mouth daily as needed.    . folic acid (FOLVITE) 400 MCG tablet Take 400 mcg by mouth daily.    . Magnesium 250 MG TABS Take 1 tablet by  mouth daily.    . meloxicam (MOBIC) 7.5 MG tablet Take 1 tablet by mouth daily as needed.    . metFORMIN (GLUMETZA) 500 MG (MOD) 24 hr tablet Take by mouth. Take 2 tabs in the morning and 1 tab in the evening    . metoprolol (TOPROL-XL) 200 MG 24 hr tablet Take 200 mg by mouth daily.    . ranitidine (ZANTAC) 300 MG tablet Take 1 tablet by mouth daily.    Marland Kitchen. rOPINIRole (REQUIP) 0.25 MG tablet Take 0.25 mg by mouth 3 (three) times daily.     No current facility-administered medications for this visit.     Allergies:   Angiotensin i, human  [angiotensin]; Flagyl [metronidazole]; Ace inhibitors; Angiotensin receptor blockers; Cottonseed oil; Nalbuphine; Nalbuphine hcl; Nickel; Other; and Codeine   ROS:  Please see the history of present illness.   Otherwise, review of systems are positive for headache, hip pain.   All other systems are reviewed and negative.    PHYSICAL EXAM: VS:  BP (!) 145/90   Pulse 80   Ht 5\' 4"  (1.626 m)   Wt 154 lb (69.9 kg)   BMI 26.43 kg/m  , BMI Body mass index is 26.43 kg/m.  GENERAL:  Well appearing NECK:  No jugular venous distention, waveform within normal limits, carotid upstroke brisk and symmetric, no bruits, no thyromegaly LUNGS:  Clear to auscultation bilaterally BACK:  No CVA tenderness CHEST:  Unremarkable HEART:  PMI not displaced or sustained,S1 and S2 within normal limits, no S3, no S4, no clicks, no rubs, no murmurs ABD:  Flat, positive bowel sounds normal in frequency in pitch, positive midline bruits, no rebound, no guarding, no midline pulsatile mass, no hepatomegaly, no splenomegaly EXT:  2 plus pulses throughout, no edema, no cyanosis no clubbing SKIN:  No rashes no nodules   EKG:  EKG is ordered today. Sinus rhythm, rate 80, axis within normal limits, intervals within normal limits, poor anterior R wave progression. This is unchanged from previous EKGs.   Lipid Panel No results found for: CHOL, TRIG, HDL, CHOLHDL, VLDL, LDLCALC,  LDLDIRECT    Wt Readings from Last 3 Encounters:  02/01/17 154 lb (69.9 kg)  10/01/16 160 lb (72.6 kg)  02/16/15 162 lb (73.5 kg)      Other studies Reviewed: Additional studies/ records that were reviewed today include: None Review of the above records demonstrates:     ASSESSMENT AND PLAN:  AI:  This was mild on echo.  No further imaging or change in therapy is indicated.   HTN:  At the last visit I started Norvasc.  Her BP is improved and OK at home.  No change in therapy is indicated.   RESTLESS LEGS:   At the last visit I gave her an MD name for referral.    BRUIT:  She has an abdominal bruit and will get an abdominal ultrasound.     Current medicines are reviewed at length with the patient today.  The patient does not have concerns regarding medicines.  The following changes have been made:  None  Labs/ tests ordered today include:    Abdominal ultrasound.     Disposition:   FU with as needed.    Signed, Rollene Rotunda, MD  02/03/2017 8:21 PM    [facility]

## 2017-02-01 ENCOUNTER — Ambulatory Visit (INDEPENDENT_AMBULATORY_CARE_PROVIDER_SITE_OTHER): Payer: Medicare Other | Admitting: Cardiology

## 2017-02-01 ENCOUNTER — Encounter: Payer: Self-pay | Admitting: Cardiology

## 2017-02-01 VITALS — BP 145/90 | HR 80 | Ht 64.0 in | Wt 154.0 lb

## 2017-02-01 DIAGNOSIS — R0989 Other specified symptoms and signs involving the circulatory and respiratory systems: Secondary | ICD-10-CM | POA: Diagnosis not present

## 2017-02-01 DIAGNOSIS — I714 Abdominal aortic aneurysm, without rupture, unspecified: Secondary | ICD-10-CM

## 2017-02-01 DIAGNOSIS — I351 Nonrheumatic aortic (valve) insufficiency: Secondary | ICD-10-CM

## 2017-02-01 NOTE — Patient Instructions (Addendum)
Medication Instructions:  Continue current medications  Labwork: None Ordered  Testing/Procedures: Your physician has requested that you have an abdominal aorta duplex. During this test, an ultrasound is used to evaluate the aorta. Allow 30 minutes for this exam. Do not eat after midnight the day before and avoid carbonated beverages   Follow-Up: Your physician recommends that you schedule a follow-up appointment in: As Needed    Any Other Special Instructions Will Be Listed Below (If Applicable).  Dohmeier Porfirio Mylararmen MD  Address: [address], [place], [place] [address] Phone: [phone]   If you need a refill on your cardiac medications before your next appointment, please call your pharmacy.

## 2017-02-03 ENCOUNTER — Encounter: Payer: Self-pay | Admitting: Cardiology

## 2017-02-25 ENCOUNTER — Ambulatory Visit (HOSPITAL_COMMUNITY)
Admission: RE | Admit: 2017-02-25 | Discharge: 2017-02-25 | Disposition: A | Discharge status: Home / Self Care | Payer: Medicare Other | Source: Ambulatory Visit | Attending: Cardiology | Admitting: Cardiology

## 2017-02-25 DIAGNOSIS — R0989 Other specified symptoms and signs involving the circulatory and respiratory systems: Secondary | ICD-10-CM | POA: Insufficient documentation

## 2017-02-25 DIAGNOSIS — E119 Type 2 diabetes mellitus without complications: Secondary | ICD-10-CM | POA: Diagnosis not present

## 2017-02-25 DIAGNOSIS — I10 Essential (primary) hypertension: Secondary | ICD-10-CM | POA: Diagnosis not present

## 2017-02-25 DIAGNOSIS — I700 Atherosclerosis of aorta: Secondary | ICD-10-CM | POA: Insufficient documentation

## 2017-02-25 DIAGNOSIS — R109 Unspecified abdominal pain: Secondary | ICD-10-CM | POA: Diagnosis not present

## 2017-02-26 ENCOUNTER — Encounter: Payer: Self-pay | Admitting: Cardiology

## 2017-02-27 ENCOUNTER — Encounter: Payer: Self-pay | Admitting: Cardiology

## 2017-11-04 ENCOUNTER — Other Ambulatory Visit: Payer: Self-pay | Admitting: Orthopedic Surgery

## 2017-11-04 DIAGNOSIS — M7061 Trochanteric bursitis, right hip: Secondary | ICD-10-CM

## 2017-11-04 DIAGNOSIS — M7062 Trochanteric bursitis, left hip: Principal | ICD-10-CM

## 2017-11-13 ENCOUNTER — Ambulatory Visit
Admission: RE | Admit: 2017-11-13 | Discharge: 2017-11-13 | Disposition: A | Discharge status: Home / Self Care | Payer: Medicare Other | Source: Ambulatory Visit | Attending: Orthopedic Surgery | Admitting: Orthopedic Surgery

## 2017-11-13 DIAGNOSIS — M7062 Trochanteric bursitis, left hip: Principal | ICD-10-CM

## 2017-11-13 DIAGNOSIS — M7061 Trochanteric bursitis, right hip: Secondary | ICD-10-CM

## 2017-11-13 NOTE — Discharge Instructions (Signed)

## 2017-11-14 LAB — TIQ-NTM

## 2017-11-18 LAB — SYNOVIAL CELL COUNT + DIFF, W/ CRYSTALS
Basophils, %: 0 %
EOSINOPHILS-SYNOVIAL: 0 % (ref 0–2)
LYMPHOCYTES-SYNOVIAL FLD: 56 % (ref 0–74)
MONOCYTE/MACROPHAGE: 22 % (ref 0–69)
NEUTROPHIL, SYNOVIAL: 10 % (ref 0–24)
Synoviocytes, %: 12 % (ref 0–15)
WBC, SYNOVIAL: 4690 {cells}/uL — AB (ref <150)

## 2017-11-18 LAB — ANAEROBIC AND AEROBIC CULTURE
AER RESULT:: NO GROWTH
MICRO NUMBER:: 81336250
MICRO NUMBER:: 81336251
SPECIMEN QUALITY: ADEQUATE
SPECIMEN QUALITY:: ADEQUATE

## 2018-02-03 ENCOUNTER — Observation Stay (HOSPITAL_BASED_OUTPATIENT_CLINIC_OR_DEPARTMENT_OTHER)
Admission: EM | Admit: 2018-02-03 | Discharge: 2018-02-05 | Disposition: A | Discharge status: Home / Self Care | Payer: Medicare Other | Attending: Internal Medicine | Admitting: Internal Medicine

## 2018-02-03 ENCOUNTER — Emergency Department (HOSPITAL_BASED_OUTPATIENT_CLINIC_OR_DEPARTMENT_OTHER): Payer: Medicare Other

## 2018-02-03 ENCOUNTER — Other Ambulatory Visit: Payer: Self-pay

## 2018-02-03 ENCOUNTER — Encounter (HOSPITAL_BASED_OUTPATIENT_CLINIC_OR_DEPARTMENT_OTHER): Payer: Self-pay | Admitting: Emergency Medicine

## 2018-02-03 DIAGNOSIS — Z96649 Presence of unspecified artificial hip joint: Secondary | ICD-10-CM | POA: Insufficient documentation

## 2018-02-03 DIAGNOSIS — I10 Essential (primary) hypertension: Secondary | ICD-10-CM | POA: Insufficient documentation

## 2018-02-03 DIAGNOSIS — R413 Other amnesia: Principal | ICD-10-CM | POA: Insufficient documentation

## 2018-02-03 DIAGNOSIS — Z7984 Long term (current) use of oral hypoglycemic drugs: Secondary | ICD-10-CM | POA: Insufficient documentation

## 2018-02-03 DIAGNOSIS — R4182 Altered mental status, unspecified: Secondary | ICD-10-CM

## 2018-02-03 DIAGNOSIS — R410 Disorientation, unspecified: Secondary | ICD-10-CM | POA: Diagnosis not present

## 2018-02-03 DIAGNOSIS — M25551 Pain in right hip: Secondary | ICD-10-CM | POA: Diagnosis not present

## 2018-02-03 DIAGNOSIS — Z79899 Other long term (current) drug therapy: Secondary | ICD-10-CM | POA: Insufficient documentation

## 2018-02-03 DIAGNOSIS — Z7982 Long term (current) use of aspirin: Secondary | ICD-10-CM | POA: Diagnosis not present

## 2018-02-03 DIAGNOSIS — E119 Type 2 diabetes mellitus without complications: Secondary | ICD-10-CM | POA: Diagnosis not present

## 2018-02-03 HISTORY — DX: Unspecified synovitis and tenosynovitis, unspecified thigh: M65.959

## 2018-02-03 HISTORY — DX: Synovitis and tenosynovitis, unspecified: M65.9

## 2018-02-03 HISTORY — DX: Deficiency of other specified B group vitamins: E53.8

## 2018-02-03 HISTORY — DX: Pain in right hip: M25.551

## 2018-02-03 HISTORY — DX: Other chronic pain: G89.29

## 2018-02-03 HISTORY — DX: Other nerve root and plexus disorders: G54.8

## 2018-02-03 HISTORY — DX: Perineural cyst: G96.191

## 2018-02-03 HISTORY — DX: Gastro-esophageal reflux disease without esophagitis: K21.9

## 2018-02-03 HISTORY — DX: Spondylosis without myelopathy or radiculopathy, lumbar region: M47.816

## 2018-02-03 HISTORY — DX: Constipation, unspecified: K59.00

## 2018-02-03 HISTORY — DX: Thyrotoxicosis, unspecified without thyrotoxic crisis or storm: E05.90

## 2018-02-03 HISTORY — DX: Unspecified osteoarthritis, unspecified site: M19.90

## 2018-02-03 HISTORY — DX: Presence of right artificial hip joint: Z96.641

## 2018-02-03 LAB — CBC WITH DIFFERENTIAL/PLATELET
BASOS ABS: 0 10*3/uL (ref 0.0–0.1)
BASOS PCT: 0 %
Eosinophils Absolute: 0.1 10*3/uL (ref 0.0–0.7)
Eosinophils Relative: 1 %
HCT: 42.6 % (ref 36.0–46.0)
HEMOGLOBIN: 14.4 g/dL (ref 12.0–15.0)
Lymphocytes Relative: 30 %
Lymphs Abs: 2.9 10*3/uL (ref 0.7–4.0)
MCH: 29.3 pg (ref 26.0–34.0)
MCHC: 33.8 g/dL (ref 30.0–36.0)
MCV: 86.8 fL (ref 78.0–100.0)
Monocytes Absolute: 0.8 10*3/uL (ref 0.1–1.0)
Monocytes Relative: 8 %
NEUTROS PCT: 61 %
Neutro Abs: 5.7 10*3/uL (ref 1.7–7.7)
Platelets: 221 10*3/uL (ref 150–400)
RBC: 4.91 MIL/uL (ref 3.87–5.11)
RDW: 13 % (ref 11.5–15.5)
WBC: 9.5 10*3/uL (ref 4.0–10.5)

## 2018-02-03 LAB — COMPREHENSIVE METABOLIC PANEL
ALBUMIN: 4.3 g/dL (ref 3.5–5.0)
ALK PHOS: 80 U/L (ref 38–126)
ALT: 17 U/L (ref 14–54)
AST: 23 U/L (ref 15–41)
Anion gap: 12 (ref 5–15)
BILIRUBIN TOTAL: 0.8 mg/dL (ref 0.3–1.2)
BUN: 14 mg/dL (ref 6–20)
CO2: 22 mmol/L (ref 22–32)
Calcium: 9.3 mg/dL (ref 8.9–10.3)
Chloride: 104 mmol/L (ref 101–111)
Creatinine, Ser: 0.74 mg/dL (ref 0.44–1.00)
GFR calc Af Amer: >60 mL/min (ref >60)
GFR calc non Af Amer: >60 mL/min (ref >60)
GLUCOSE: 105 mg/dL — AB (ref 65–99)
POTASSIUM: 3.4 mmol/L — AB (ref 3.5–5.1)
SODIUM: 138 mmol/L (ref 135–145)
TOTAL PROTEIN: 7.1 g/dL (ref 6.5–8.1)

## 2018-02-03 LAB — URINALYSIS, ROUTINE W REFLEX MICROSCOPIC
Bilirubin Urine: NEGATIVE
Glucose, UA: NEGATIVE mg/dL
Hgb urine dipstick: NEGATIVE
KETONES UR: NEGATIVE mg/dL
LEUKOCYTES UA: NEGATIVE
NITRITE: NEGATIVE
PROTEIN: NEGATIVE mg/dL
Specific Gravity, Urine: <1.005 — ABNORMAL LOW (ref 1.005–1.030)
pH: 6 (ref 5.0–8.0)

## 2018-02-03 LAB — TSH: TSH: 1.355 u[IU]/mL (ref 0.350–4.500)

## 2018-02-03 LAB — AMMONIA: Ammonia: 14 umol/L (ref 9–35)

## 2018-02-03 MED ORDER — IBUPROFEN 400 MG PO TABS
400.0000 mg | ORAL_TABLET | Freq: Once | ORAL | Status: AC | PRN
  Administered 2018-02-03: 400 mg via ORAL
  Filled 2018-02-03: qty 1

## 2018-02-03 NOTE — ED Triage Notes (Signed)
Pt having a flare up of right hip pain.  Pt states she has been having issues with her hip since her hip replacement three years ago.  Pt was seen by orthopedic in December and had some fluid removed from her hip.  Pt states she is having severe pain, increased pain, difficulty with ambulation.

## 2018-02-03 NOTE — ED Notes (Signed)
ED Provider at bedside. 

## 2018-02-03 NOTE — ED Notes (Addendum)
Friend to the nurse first desk-stating patient is confused again and "that this wasn't like her".  Explained to friend that I had talked with her earlier about this.  Assessed again by this RN.  Patient able to answer all questions except for month without difficulty.  Patient able to tell me that I recently took her BP and gave her medication.  States no relief from pain.

## 2018-02-03 NOTE — ED Notes (Signed)
In and out cath done.  Assisted by Glena NorfolkNando, EMT.  Patient tolerated procedure well.

## 2018-02-03 NOTE — ED Provider Notes (Signed)
[facility] EMERGENCY DEPARTMENT Provider Note   CSN: [id number] Arrival date & time: 02/03/18  1334     History   Chief Complaint Chief Complaint  Patient presents with  . Hip Pain    Chronic    HPI Shelby Robinson is a 72 y.o. female.  The history is provided by the patient, a relative, medical records and a friend. No language interpreter was used.  Altered Mental Status   This is a new problem. The current episode started 12 to 24 hours ago. The problem has not changed since onset.Associated symptoms include confusion. Pertinent negatives include no somnolence, no seizures, no unresponsiveness, no weakness, no agitation and no hallucinations. Her past medical history does not include seizures, CVA, COPD, depression, dementia or head trauma.  Cough  This is a new problem. The current episode started more than 1 week ago. The problem occurs constantly. The problem has not changed since onset.The cough is non-productive. There has been no fever. Pertinent negatives include no chest pain, no chills, no headaches, no rhinorrhea, no shortness of breath and no wheezing. Her past medical history does not include COPD.    Past Medical History:  Diagnosis Date  . B12 deficiency   . 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 Constipation   . Diabetes mellitus without complication (HCC)   . GERD (gastroesophageal reflux disease)   . Hypertension   . Hyperthyroidism   . Lumbar spondylosis   . Osteoarthritis   . Osteoarthritis of lumbar spine   . Synovitis of hip   . Tarlov cyst     Patient Active Problem List   Diagnosis Date Noted  . Aortic insufficiency 10/01/2016  . Mitral regurgitation 10/01/2016    Past Surgical History:  Procedure Laterality Date  . cataract    . ELBOW SURGERY Right   . HIP SURGERY     Bilateral THR  . TOTAL HIP ARTHROPLASTY    . VAGINAL HYSTERECTOMY      OB History    No data available       Home Medications     Prior to Admission medications   Medication Sig Start Date End Date Taking? Authorizing Provider  aspirin 81 MG chewable tablet Chew 1 tablet by mouth daily.   Yes [provider]  Biotin 2500 MCG CAPS Take 1 capsule by mouth daily.   Yes [provider]  Coenzyme Q10 (CO Q 10) 100 MG CAPS Take 1 capsule by mouth daily.   Yes [provider]  Cyanocobalamin (B-12) 2500 MCG TABS Take 1 tablet by mouth daily.   Yes [provider]  meloxicam (MOBIC) 7.5 MG tablet Take 1 tablet by mouth daily as needed.   Yes [provider]  metFORMIN (GLUMETZA) 500 MG (MOD) 24 hr tablet Take by mouth. Take 2 tabs in the morning and 1 tab in the evening   Yes [provider]  metoprolol (TOPROL-XL) 200 MG 24 hr tablet Take 200 mg by mouth daily.   Yes [provider]  ranitidine (ZANTAC) 300 MG tablet Take 1 tablet by mouth daily. 01/15/16  Yes [provider]  amLODipine (NORVASC) 2.5 MG tablet Take 2.5 mg by mouth daily.    [provider]    Family History Family History  Problem Relation Age of Onset  . Alzheimer's disease Mother   . Stroke Father   . CAD Father 37       CABG  . Coarctation  of the aorta Daughter     Social History Social History   Tobacco Use  . Smoking status: Never Smoker  . Smokeless tobacco: Never Used  Substance Use Topics  . Alcohol use: No  . Drug use: No     Allergies   Angiotensin i, human [angiotensin]; Flagyl [metronidazole]; Codeine; Nalbuphine; Ace inhibitors; Angiotensin receptor blockers; Other; Cottonseed oil; and Nickel   Review of Systems Review of Systems  Constitutional: Negative for chills, diaphoresis, fatigue and fever.  HENT: Negative for congestion and rhinorrhea.   Eyes: Negative for photophobia and visual disturbance.  Respiratory: Negative for cough, chest tightness, shortness of breath, wheezing and stridor.   Cardiovascular: Negative for chest pain and leg  swelling.  Gastrointestinal: Negative for abdominal pain, constipation, diarrhea, nausea and vomiting.  Genitourinary: Positive for decreased urine volume (and foul smelling). Negative for difficulty urinating, dysuria, flank pain and frequency.  Musculoskeletal: Negative for back pain, neck pain and neck stiffness.  Skin: Negative for rash and wound.  Neurological: Negative for seizures, weakness, light-headedness and headaches.  Psychiatric/Behavioral: Positive for confusion. Negative for agitation, behavioral problems and hallucinations.  All other systems reviewed and are negative.    Physical Exam Updated Vital Signs BP (!) 185/98 (BP Location: Right Arm)   Pulse 64   Temp 98.5 F (36.9 C) (Oral)   Resp 18   Ht 5\' 4"  (1.626 m)   Wt 69.9 kg (154 lb)   SpO2 99%   BMI 26.43 kg/m   Physical Exam  Constitutional: She appears well-developed and well-nourished. No distress.  HENT:  Head: Normocephalic and atraumatic.  Mouth/Throat: Oropharynx is clear and moist.  Eyes: Conjunctivae and EOM are normal. Pupils are equal, round, and reactive to light.  Neck: Normal range of motion.  Cardiovascular: Normal rate and intact distal pulses.  No murmur heard. Pulmonary/Chest: Effort normal. No stridor. No respiratory distress. She has no wheezes. She exhibits no tenderness.  Abdominal: She exhibits no distension. There is no tenderness.  Musculoskeletal: She exhibits edema. She exhibits no tenderness.  Neurological: She is alert. She is disoriented. She displays no tremor and normal reflexes. No cranial nerve deficit or sensory deficit. She exhibits normal muscle tone. Coordination normal. GCS eye subscore is 4. GCS verbal subscore is 5. GCS motor subscore is 6.  Patient is disoriented to place and time.  Patient had to think about her name.  Patient kept repeating the same questions concerning for amnesia.  Skin: Capillary refill takes less than 2 seconds. No rash noted. She is not  diaphoretic. No erythema.  Nursing note and vitals reviewed.    ED Treatments / Results  Labs (all labs ordered are listed, but only abnormal results are displayed) Labs Reviewed  URINALYSIS, ROUTINE W REFLEX MICROSCOPIC - Abnormal; Notable for the following components:      Result Value   Specific Gravity, Urine <1.005 (*)    All other components within normal limits  COMPREHENSIVE METABOLIC PANEL - Abnormal; Notable for the following components:   Potassium 3.4 (*)    Glucose, Bld 105 (*)    All other components within normal limits  URINE CULTURE  CBC WITH DIFFERENTIAL/PLATELET  AMMONIA  TSH    EKG  EKG Interpretation None       Radiology Dg Chest 2 View  Result Date: 02/03/2018 CLINICAL DATA:  Cough and left-sided chest pain with weakness since Friday. EXAM: CHEST  2 VIEW COMPARISON:  04/02/2016 FINDINGS: The heart size and mediastinal contours are within normal limits.  Both lungs are clear. The visualized skeletal structures are unremarkable. IMPRESSION: No active cardiopulmonary disease. Electronically Signed   By: Tollie Eth M.D.   On: 02/03/2018 20:30   Ct Head Wo Contrast  Result Date: 02/03/2018 CLINICAL DATA:  Altered level of consciousness EXAM: CT HEAD WITHOUT CONTRAST TECHNIQUE: Contiguous axial images were obtained from the base of the skull through the vertex without intravenous contrast. COMPARISON:  Report from 01/01/2009 FINDINGS: BRAIN: There is sulcal and ventricular prominence consistent with superficial and central atrophy. No intraparenchymal hemorrhage, mass effect nor midline shift. Periventricular and subcortical white matter hypodensities consistent with chronic small vessel ischemic disease are identified. No acute large vascular territory infarcts. No abnormal extra-axial fluid collections. Basal cisterns are not effaced and midline. VASCULAR: Moderate calcific atherosclerosis of the carotid siphons. SKULL: No skull fracture. No significant scalp  soft tissue swelling. SINUSES/ORBITS: The mastoid air-cells are clear. The included paranasal sinuses are well-aerated.The included ocular globes and orbital contents are non-suspicious. Bilateral cataract extractions. OTHER: None. IMPRESSION: Atrophy with chronic small vessel ischemic disease. No acute intracranial abnormality. Electronically Signed   By: Tollie Eth M.D.   On: 02/03/2018 20:29   Dg Hip Unilat W Or Wo Pelvis 2-3 Views Right  Result Date: 02/03/2018 CLINICAL DATA:  Right hip pain. Remote history of total hip arthroplasty. EXAM: DG HIP (WITH OR WITHOUT PELVIS) 2-3V RIGHT COMPARISON:  CT scan 02/16/2015 FINDINGS: There are bilateral total hip arthroplasties which appears stable. No complicating features are identified. The pubic symphysis and SI joints are intact. No pelvic fractures or bone lesions. IMPRESSION: Bilateral hip prosthesis without complicating features. Intact bony pelvis. Electronically Signed   By: Rudie Meyer M.D.   On: 02/03/2018 21:23    Procedures Procedures (including critical care time)  Medications Ordered in ED Medications  ibuprofen (ADVIL,MOTRIN) 400 MG tablet (not administered)  ibuprofen (ADVIL,MOTRIN) tablet 400 mg (400 mg Oral Given 02/03/18 1554)  ibuprofen (ADVIL,MOTRIN) tablet 400 mg (400 mg Oral Given 02/04/18 0050)     Initial Impression / Assessment and Plan / ED Course  I have reviewed the triage vital signs and the nursing notes.  Pertinent labs & imaging results that were available during my care of the patient were reviewed by me and considered in my medical decision making (see chart for details).     Shelby Robinson is a 72 y.o. female with a past medical history significant for hypertension, diabetes, chronic right hip pain status post hip replacement and thyroid disease who presents with continued right hip pain and altered mental status.  Family reports that patient has had multiple falls and has had right hip pain most recently 3  days ago.  Patient reports that she was feeling better this morning but then when she spoke with her family was acting confused.  They say that patient has been repeating herself and is disoriented to location and time.  This is brand-new for the patient.  She has no history of dementia or other intracranial abnormality.  They report that patient has a strong family history of Cadasil (Cerebral autosomal dominant arteriopathy with subcortical infarcts and leukoencephalopathy), but has never been diagnosed herself with it.  With the report that the patient has had some foul-smelling urine but she denies any dysuria.  Patient has had a dry cough that has been ongoing for weeks.  No significant chest pain, shortness of breath, or other symptoms.  No conservation or diarrhea.  No fevers or chills.  On exam, patient was disoriented.  Patient had no other focal neurologic deficits.  Patient was repeating herself frequently concerning for a transient amnesia.  Laboratory testing was performed to look for right hip injury or other sources of symptoms.  Chest x-ray shows no pneumonia.  CT head showed no significant cranial abnormalities.  Hip x-ray showed no new fracture.  Laboratory testing was overall reassuring.  Given the continued altered mental status compared to baseline, neurology was called.  They are concerned about transient global amnesia versus a seizure.  They recommended patient be admitted to [facility] where they can see the patient and she can get both MRI and EEG.  Patient and family agreed with plan of admission and patient was admitted in stable condition.     Final Clinical Impressions(s) / ED Diagnoses   Final diagnoses:  Transient amnesia  Altered mental status, unspecified altered mental status type  Disorientation  Right hip pain    ED Discharge Orders    None      Clinical Impression: 1. Transient amnesia   2. Altered mental status, unspecified altered mental status  type   3. Disorientation   4. Right hip pain     Disposition: Admit  This note was prepared with assistance of Dragon voice recognition software. Occasional wrong-word or sound-a-like substitutions may have occurred due to the inherent limitations of voice recognition software.      Tegeler, Canary Brim, MD 02/04/18 [phone]

## 2018-02-03 NOTE — ED Notes (Signed)
Per family patient is acutely confused.  Patient assessed by this RN.  Alert and oriented to person, place, situation.  Unable to answer the month but knows the day of the week.  Patient states she believes her "forgetfullness" is related to anxiety.  Spouse states that same thing.  Daughters verbalized understanding.

## 2018-02-04 ENCOUNTER — Encounter (HOSPITAL_BASED_OUTPATIENT_CLINIC_OR_DEPARTMENT_OTHER): Payer: Self-pay | Admitting: *Deleted

## 2018-02-04 ENCOUNTER — Observation Stay (HOSPITAL_COMMUNITY): Payer: Medicare Other

## 2018-02-04 DIAGNOSIS — R4182 Altered mental status, unspecified: Secondary | ICD-10-CM

## 2018-02-04 DIAGNOSIS — I10 Essential (primary) hypertension: Secondary | ICD-10-CM

## 2018-02-04 DIAGNOSIS — R410 Disorientation, unspecified: Secondary | ICD-10-CM

## 2018-02-04 DIAGNOSIS — M25551 Pain in right hip: Secondary | ICD-10-CM

## 2018-02-04 DIAGNOSIS — G9340 Encephalopathy, unspecified: Secondary | ICD-10-CM

## 2018-02-04 DIAGNOSIS — R413 Other amnesia: Secondary | ICD-10-CM | POA: Diagnosis not present

## 2018-02-04 LAB — GLUCOSE, CAPILLARY
GLUCOSE-CAPILLARY: 113 mg/dL — AB (ref 65–99)
GLUCOSE-CAPILLARY: 107 mg/dL — AB (ref 65–99)
GLUCOSE-CAPILLARY: 91 mg/dL (ref 65–99)
Glucose-Capillary: 169 mg/dL — ABNORMAL HIGH (ref 65–99)

## 2018-02-04 LAB — CBC
HEMATOCRIT: 40.8 % (ref 36.0–46.0)
Hemoglobin: 13.5 g/dL (ref 12.0–15.0)
MCH: 29.5 pg (ref 26.0–34.0)
MCHC: 33.1 g/dL (ref 30.0–36.0)
MCV: 89.3 fL (ref 78.0–100.0)
Platelets: 196 10*3/uL (ref 150–400)
RBC: 4.57 MIL/uL (ref 3.87–5.11)
RDW: 13.7 % (ref 11.5–15.5)
WBC: 7.5 10*3/uL (ref 4.0–10.5)

## 2018-02-04 LAB — BASIC METABOLIC PANEL
ANION GAP: 14 (ref 5–15)
BUN: 11 mg/dL (ref 6–20)
CALCIUM: 9.2 mg/dL (ref 8.9–10.3)
CO2: 22 mmol/L (ref 22–32)
Chloride: 106 mmol/L (ref 101–111)
Creatinine, Ser: 0.93 mg/dL (ref 0.44–1.00)
GFR calc Af Amer: >60 mL/min (ref >60)
GFR calc non Af Amer: 60 mL/min — ABNORMAL LOW (ref >60)
GLUCOSE: 116 mg/dL — AB (ref 65–99)
Potassium: 3.7 mmol/L (ref 3.5–5.1)
Sodium: 142 mmol/L (ref 135–145)

## 2018-02-04 LAB — VITAMIN B12: VITAMIN B 12: 891 pg/mL (ref 180–914)

## 2018-02-04 MED ORDER — ONDANSETRON HCL 4 MG PO TABS: 4.0000 mg | ORAL_TABLET | Freq: Four times a day (QID) | ORAL | Status: DC | PRN

## 2018-02-04 MED ORDER — BIOTIN 2500 MCG PO CAPS: 1.0000 | ORAL_CAPSULE | Freq: Every day | ORAL | Status: DC

## 2018-02-04 MED ORDER — VITAMIN B-12 1000 MCG PO TABS
1000.0000 ug | ORAL_TABLET | Freq: Every day | ORAL | Status: DC
  Administered 2018-02-04 – 2018-02-05 (×2): 1000 ug via ORAL
  Filled 2018-02-04 (×2): qty 1

## 2018-02-04 MED ORDER — ACETAMINOPHEN 325 MG PO TABS
650.0000 mg | ORAL_TABLET | Freq: Four times a day (QID) | ORAL | Status: DC | PRN
  Administered 2018-02-04 – 2018-02-05 (×3): 650 mg via ORAL
  Filled 2018-02-04 (×3): qty 2

## 2018-02-04 MED ORDER — AMLODIPINE BESYLATE 2.5 MG PO TABS
2.5000 mg | ORAL_TABLET | Freq: Every day | ORAL | Status: DC
  Administered 2018-02-04 – 2018-02-05 (×2): 2.5 mg via ORAL
  Filled 2018-02-04 (×2): qty 1

## 2018-02-04 MED ORDER — IBUPROFEN 400 MG PO TABS
ORAL_TABLET | ORAL | Status: AC
  Filled 2018-02-04: qty 1

## 2018-02-04 MED ORDER — LORAZEPAM 2 MG/ML IJ SOLN
1.0000 mg | Freq: Once | INTRAMUSCULAR | Status: DC
  Filled 2018-02-04: qty 1

## 2018-02-04 MED ORDER — ACETAMINOPHEN 650 MG RE SUPP: 650.0000 mg | Freq: Four times a day (QID) | RECTAL | Status: DC | PRN

## 2018-02-04 MED ORDER — METOPROLOL SUCCINATE ER 100 MG PO TB24
200.0000 mg | ORAL_TABLET | Freq: Every day | ORAL | Status: DC
  Administered 2018-02-04 – 2018-02-05 (×2): 200 mg via ORAL
  Filled 2018-02-04 (×2): qty 2

## 2018-02-04 MED ORDER — FAMOTIDINE 20 MG PO TABS
20.0000 mg | ORAL_TABLET | Freq: Two times a day (BID) | ORAL | Status: DC
Start: 2018-02-04 — End: 2018-02-05
  Administered 2018-02-04 – 2018-02-05 (×3): 20 mg via ORAL
  Filled 2018-02-04 (×3): qty 1

## 2018-02-04 MED ORDER — ASPIRIN 81 MG PO CHEW
81.0000 mg | CHEWABLE_TABLET | Freq: Every day | ORAL | Status: DC
  Administered 2018-02-04 – 2018-02-05 (×2): 81 mg via ORAL
  Filled 2018-02-04 (×2): qty 1

## 2018-02-04 MED ORDER — ONDANSETRON HCL 4 MG/2ML IJ SOLN: 4.0000 mg | Freq: Four times a day (QID) | INTRAMUSCULAR | Status: DC | PRN

## 2018-02-04 MED ORDER — INSULIN ASPART 100 UNIT/ML ~~LOC~~ SOLN
0.0000 [IU] | Freq: Three times a day (TID) | SUBCUTANEOUS | Status: DC
  Administered 2018-02-04: 2 [IU] via SUBCUTANEOUS

## 2018-02-04 MED ORDER — IBUPROFEN 400 MG PO TABS
400.0000 mg | ORAL_TABLET | Freq: Once | ORAL | Status: AC | PRN
  Administered 2018-02-04: 400 mg via ORAL

## 2018-02-04 NOTE — Progress Notes (Signed)
Received report from [employer] RN.  Pt has already eaten dinner with no difficulties.  In/out cath performed.  Pt resting comfortably awaiting carelink.

## 2018-02-04 NOTE — Progress Notes (Signed)
EEG complete - results pending 

## 2018-02-04 NOTE — Progress Notes (Signed)
Patient seen and examined, admitted by Dr. Toniann FailKakrakandy this morning  Briefly 72 year old female with hypertension, diabetes, B12 deficiency, presented with confusion and right hip pain, difficult to place weight on the right hip, chronic, worsening  BP (!) 149/94 (BP Location: Right Arm)   Pulse 76   Temp 97.7 F (36.5 C) (Oral)   Resp 16   Ht 5\' 4"  (1.626 m)   Wt 73.2 kg (161 lb 6 oz)   SpO2 97%   BMI 27.70 kg/m   A/p Agree with Dr. Katherene PontoKakrakandy's assessment and plan - Follow MRI of the brain, EEG as recommended by neurology, if positive for stroke, official consult to neurology  Right hip pain/low back pain - Previously has followed with Dr. 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 No prior imaging available. - Follow MRI of the right hip, lumbar spine to assess for any nerve root compression due to cyst or acute hip pathology.   Discussed in detail with the patient, daughter and son-in-law at the bedside  Massie Cogliano M.D. [facility] 02/04/2018, 12:02 PM  Pager: [phone]

## 2018-02-04 NOTE — Consult Note (Signed)
Neurology Consultation Reason for Consult: Memory difficulty Referring Physician: Toniann Fail, A  CC: Memory difficulty, hip pain  History is obtained from: Patient, husband  HPI: Shelby Robinson is a 72 y.o. female with a history of chronic hip pain was in her normal state of health on awakening this morning. Then she developed acute right hip pain and stiffness. There was no jerking no spasming, but she states that it wasn't seeming to work as it should. It was very painful, causing her to cry out and concern her husband.  It was for this that they sought care in the emergency department, however once there became clear that patient was having some difficulty with cognition as well. She repeatedly  Had difficulty answering simple questions such as month. She also was repeating the same story over and over again. She was not, however, asking why she was there.  The patient states that she feels that she remembers the bulk of the day  Her husband describes her leg dysfunction as being able to support her, however she would not bend it and he had to use his leg to kick her leg forward at which point should be able to support herself on it to take a small step.  LKW: This morning, unclear exact time tpa given?: no, outside of window  ROS: A 14 point ROS was performed and is negative except as noted in the HPI.   Past Medical History:  Diagnosis Date  . B12 deficiency   . 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 Constipation   . Diabetes mellitus without complication (HCC)   . GERD (gastroesophageal reflux disease)   . Hypertension   . Hyperthyroidism   . Lumbar spondylosis   . Osteoarthritis   . Osteoarthritis of lumbar spine   . Synovitis of hip   . Tarlov cyst      Family History  Problem Relation Age of Onset  . Alzheimer's disease Mother   . Stroke Father   . CAD Father 30       CABG  . Coarctation of the aorta Daughter   CADASIL   Social  History:  reports that  has never smoked. she has never used smokeless tobacco. She reports that she does not drink alcohol or use drugs.   Exam: Current vital signs: BP (!) (P) 149/78 (BP Location: Right Arm) Comment: [id number]  Pulse (P) 68   Temp (P) 97.6 F (36.4 C) (Oral)   Resp (P) 18   Ht 5\' 4"  (1.626 m)   Wt 73.2 kg (161 lb 6 oz)   SpO2 (P) 99%   BMI 27.70 kg/m  Vital signs in last 24 hours: Temp:  [97.6 F (36.4 C)-98.6 F (37 C)] (P) 97.6 F (36.4 C) (02/19 0224) Pulse Rate:  [63-73] (P) 68 (02/19 0224) Resp:  [16-18] (P) 18 (02/19 0224) BP: (144-202)/(71-103) (P) 149/78 (02/19 0224) SpO2:  [93 %-100 %] (P) 99 % (02/19 0224) Weight:  [69.9 kg (154 lb)-73.2 kg (161 lb 6 oz)] 73.2 kg (161 lb 6 oz) (02/19 0100)   Physical Exam  Constitutional: Appears well-developed and well-nourished.  Psych: Affect appropriate to situation Eyes: No scleral injection HENT: No OP obstrucion Head: Normocephalic.  Cardiovascular: Normal rate and regular rhythm.  Respiratory: Effort normal, non-labored breathing GI: Soft.  No distension. There is no tenderness.  Skin: WDI Extremities: Dorsalis pedis pulses are strong bilaterally.  Neuro: Mental Status: Patient is awake, alert gives the year as 2015,  does not give the month correctly [facility]. Cranial Nerves: II: Visual Fields are full. Pupils are equal, round, and reactive to light.   III,IV, VI: EOMI without ptosis or diploplia.  V: Facial sensation is symmetric to temperature VII: Facial movement is symmetric.  VIII: hearing is intact to voice X: Uvula elevates symmetrically XI: Shoulder shrug is symmetric. XII: tongue is midline without atrophy or fasciculations.  Motor: Tone is normal. Bulk is normal. 5/5 strength was present in all four extremities.  Sensory: Sensation is symmetric to light touch and temperature in the arms and legs. Cerebellar: FNF and HKS are intact bilaterally   I have reviewed  labs in epic and the results pertinent to this consultation are: CMP-unremarkable  I have reviewed the images obtained: CT head-negative  Impression: 72 year old female with a one-day history of confusion, repetition, memory difficulty and right leg problems. The pain that is described in conjunction with her right leg dysfunction would be very unusual for stroke, and I wonder if she has 2 separate issues a musculoskeletal problem due to long-standing hip issues precipitating stress and triggering transient global amnesia.  Some of her description does make me wonder if mild aphasia that up and playing a role in which case she did have an ACA territory infarct she might have some leg dysfunction and difficulty speaking, but pain would be very uncommon.  Her improvement is reassuring, but she does not feel back to baseline.  Focal seizures can cause pain, however the duration would be extraordinarily unusual and the semiology without any type of jerking makes me think that this is relatively unlikely.  Recommendations: 1) MRI brain 2) EEG 3) if pain continues to be a prominent component, may need orthopedic consult 4) neurology will continue to follow  Ritta SlotMcNeill Valyn Latchford, MD [facility] [phone]  If 7pm- 7am, please page neurology on call as listed in AMION.

## 2018-02-04 NOTE — H&P (Signed)
History and Physical    Xavier Fournier ZOX:[id number] DOB: January 19, 1946 DOA: 02/03/2018  PCP: [name], MD  Patient coming from: Home.  Chief Complaint: Right hip pain confusion.  HPI: Shelby Robinson is a 72 y.o. female with history of hypertension diabetes mellitus type 2, B12 deficiency was brought to the ER at [facility] with complaints of right hip pain.  Patient has been having chronic right hip pain which is acutely worsening.  Patient found it difficult to place weight on the right hip.  Denies any fall or trauma.  Patient states Dr. Charlann Boxer orthopedic surgeon had drained fluid from the right hip in December 2018.  Patient's husband and patient does not recall the results of the fluid.  ED Course: In the ER patient in addition was found to be confused.  Patient was oriented to the place but not the month and was unable to recall certain things.  CT head was unremarkable.  Neurologist on-call was consulted and patient is being admitted for possible transient global amnesia.  Review of Systems: As per HPI, rest all negative.   Past Medical History:  Diagnosis Date  . B12 deficiency   . 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 Constipation   . Diabetes mellitus without complication (HCC)   . GERD (gastroesophageal reflux disease)   . Hypertension   . Hyperthyroidism   . Lumbar spondylosis   . Osteoarthritis   . Osteoarthritis of lumbar spine   . Synovitis of hip   . Tarlov cyst     Past Surgical History:  Procedure Laterality Date  . cataract    . ELBOW SURGERY Right   . HIP SURGERY     Bilateral THR  . TOTAL HIP ARTHROPLASTY    . VAGINAL HYSTERECTOMY       reports that  has never smoked. she has never used smokeless tobacco. She reports that she does not drink alcohol or use drugs.  Allergies  Allergen Reactions  . Angiotensin I, Human [Angiotensin] Swelling    Suspect angioedema  . Flagyl [Metronidazole] Anaphylaxis and Hives   . Codeine Nausea Only, Palpitations and Other (See Comments)    headache  . Nalbuphine Other (See Comments)     Mental Status Changes   . Ace Inhibitors   . Angiotensin Receptor Blockers   . Other     To BP meds-unable to remember; tree nuts   . Cottonseed Oil Hives  . Nickel Hives    Family History  Problem Relation Age of Onset  . Alzheimer's disease Mother   . Stroke Father   . CAD Father 5       CABG  . Coarctation of the aorta Daughter     Prior to Admission medications   Medication Sig Start Date End Date Taking? Authorizing Provider  aspirin 81 MG chewable tablet Chew 1 tablet by mouth daily.   Yes [provider]  Biotin 2500 MCG CAPS Take 1 capsule by mouth daily.   Yes [provider]  Coenzyme Q10 (CO Q 10) 100 MG CAPS Take 1 capsule by mouth daily.   Yes [provider]  Cyanocobalamin (B-12) 2500 MCG TABS Take 1 tablet by mouth daily.   Yes [provider]  meloxicam (MOBIC) 7.5 MG tablet Take 1 tablet by mouth daily as needed.   Yes [provider]  metFORMIN (GLUMETZA) 500 MG (MOD) 24 hr tablet Take by mouth. Take 2 tabs in the  morning and 1 tab in the evening   Yes [provider]  metoprolol (TOPROL-XL) 200 MG 24 hr tablet Take 200 mg by mouth daily.   Yes [provider]  ranitidine (ZANTAC) 300 MG tablet Take 1 tablet by mouth daily. 01/15/16  Yes [provider]  amLODipine (NORVASC) 2.5 MG tablet Take 2.5 mg by mouth daily.    [provider]    Physical Exam: Vitals:   02/04/18 0037 02/04/18 0040 02/04/18 0100 02/04/18 0224  BP: (!) 153/80   (!) 149/78  Pulse: 73   68  Resp:    18  Temp:  97.7 F (36.5 C)  97.6 F (36.4 C)  TempSrc:  Oral  Oral  SpO2: 100%   99%  Weight:   73.2 kg (161 lb 6 oz)   Height:   5\' 4"  (1.626 m)       Constitutional: Moderately built and nourished. Vitals:   02/04/18 0037 02/04/18 0040 02/04/18 0100 02/04/18 0224  BP: (!) 153/80    (!) 149/78  Pulse: 73   68  Resp:    18  Temp:  97.7 F (36.5 C)  97.6 F (36.4 C)  TempSrc:  Oral  Oral  SpO2: 100%   99%  Weight:   73.2 kg (161 lb 6 oz)   Height:   5\' 4"  (1.626 m)    Eyes: Anicteric no pallor. ENMT: No discharge from the ears eyes nose or mouth. Neck: No mass felt.  No neck rigidity.  No JVD appreciated. Respiratory: No rhonchi or crepitations. Cardiovascular: S1-S2 heard no murmurs appreciated. Abdomen: Soft nontender bowel sounds present.  No guarding or rigidity. Musculoskeletal: Pain on moving right hip. Skin: No rash. Neurologic: Alert awake oriented to place and person.  Has some difficulty to remember the month of the ER.  Moves all extremities 5 x 5.  Limited by right hip pain.  Pupils are equal and reacting to light.  No facial asymmetry.  Tongue is midline. Psychiatric: Mildly confused.   Labs on Admission: I have personally reviewed following labs and imaging studies  CBC: Recent Labs  Lab 02/03/18 1925  WBC 9.5  NEUTROABS 5.7  HGB 14.4  HCT 42.6  MCV 86.8  PLT 221   Basic Metabolic Panel: Recent Labs  Lab 02/03/18 1925  NA 138  K 3.4*  CL 104  CO2 22  GLUCOSE 105*  BUN 14  CREATININE 0.74  CALCIUM 9.3   GFR: Estimated Creatinine Clearance: 61.4 mL/min (by C-G formula based on SCr of 0.74 mg/dL). Liver Function Tests: Recent Labs  Lab 02/03/18 1925  AST 23  ALT 17  ALKPHOS 80  BILITOT 0.8  PROT 7.1  ALBUMIN 4.3   No results for input(s): LIPASE, AMYLASE in the last 168 hours. Recent Labs  Lab 02/03/18 2038  AMMONIA 14   Coagulation Profile: No results for input(s): INR, PROTIME in the last 168 hours. Cardiac Enzymes: No results for input(s): CKTOTAL, CKMB, CKMBINDEX, TROPONINI in the last 168 hours. BNP (last 3 results) No results for input(s): PROBNP in the last 8760 hours. HbA1C: No results for input(s): HGBA1C in the last 72 hours. CBG: No results for input(s): GLUCAP in the last 168 hours. Lipid  Profile: No results for input(s): CHOL, HDL, LDLCALC, TRIG, CHOLHDL, LDLDIRECT in the last 72 hours. Thyroid Function Tests: Recent Labs    02/03/18 2038  TSH 1.355   Anemia Panel: No results for input(s): VITAMINB12, FOLATE, FERRITIN, TIBC, IRON, RETICCTPCT in the last  72 hours. Urine analysis:    Component Value Date/Time   COLORURINE YELLOW 02/03/2018 1415   APPEARANCEUR CLEAR 02/03/2018 1415   LABSPEC <1.005 (L) 02/03/2018 1415   PHURINE 6.0 02/03/2018 1415   GLUCOSEU NEGATIVE 02/03/2018 1415   HGBUR NEGATIVE 02/03/2018 1415   BILIRUBINUR NEGATIVE 02/03/2018 1415   KETONESUR NEGATIVE 02/03/2018 1415   PROTEINUR NEGATIVE 02/03/2018 1415   UROBILINOGEN 1.0 02/16/2015 1248   NITRITE NEGATIVE 02/03/2018 1415   LEUKOCYTESUR NEGATIVE 02/03/2018 1415   Sepsis Labs: @LABRCNTIP (procalcitonin:4,lacticidven:4) )No results found for this or any previous visit (from the past 240 hour(s)).   Radiological Exams on Admission: Dg Chest 2 View  Result Date: 02/03/2018 CLINICAL DATA:  Cough and left-sided chest pain with weakness since Friday. EXAM: CHEST  2 VIEW COMPARISON:  04/02/2016 FINDINGS: The heart size and mediastinal contours are within normal limits. Both lungs are clear. The visualized skeletal structures are unremarkable. IMPRESSION: No active cardiopulmonary disease. Electronically Signed   By: Tollie Eth M.D.   On: 02/03/2018 20:30   Ct Head Wo Contrast  Result Date: 02/03/2018 CLINICAL DATA:  Altered level of consciousness EXAM: CT HEAD WITHOUT CONTRAST TECHNIQUE: Contiguous axial images were obtained from the base of the skull through the vertex without intravenous contrast. COMPARISON:  Report from 01/01/2009 FINDINGS: BRAIN: There is sulcal and ventricular prominence consistent with superficial and central atrophy. No intraparenchymal hemorrhage, mass effect nor midline shift. Periventricular and subcortical white matter hypodensities consistent with chronic small vessel  ischemic disease are identified. No acute large vascular territory infarcts. No abnormal extra-axial fluid collections. Basal cisterns are not effaced and midline. VASCULAR: Moderate calcific atherosclerosis of the carotid siphons. SKULL: No skull fracture. No significant scalp soft tissue swelling. SINUSES/ORBITS: The mastoid air-cells are clear. The included paranasal sinuses are well-aerated.The included ocular globes and orbital contents are non-suspicious. Bilateral cataract extractions. OTHER: None. IMPRESSION: Atrophy with chronic small vessel ischemic disease. No acute intracranial abnormality. Electronically Signed   By: Tollie Eth M.D.   On: 02/03/2018 20:29   Dg Hip Unilat W Or Wo Pelvis 2-3 Views Right  Result Date: 02/03/2018 CLINICAL DATA:  Right hip pain. Remote history of total hip arthroplasty. EXAM: DG HIP (WITH OR WITHOUT PELVIS) 2-3V RIGHT COMPARISON:  CT scan 02/16/2015 FINDINGS: There are bilateral total hip arthroplasties which appears stable. No complicating features are identified. The pubic symphysis and SI joints are intact. No pelvic fractures or bone lesions. IMPRESSION: Bilateral hip prosthesis without complicating features. Intact bony pelvis. Electronically Signed   By: Rudie Meyer M.D.   On: 02/03/2018 21:23     Assessment/Plan Principal Problem:   Transient amnesia Active Problems:   Right hip pain   Hypertension   Amnesia    1. Transient global amnesia -appreciate neurology consult.  MRI brain and EEG has been ordered.  Will check B12 levels.  TSH and ammonia levels were within acceptable levels. 2. Right hip pain -I have ordered an MRI of the right hip.  May discuss with Dr. Charlann Boxer patient orthopedic surgeon in a.m. 3. Hypertension-on amlodipine and metoprolol which will be continued. 4. Diabetes mellitus type 2 we will hold metformin while inpatient.  On sliding scale insulin.   DVT prophylaxis: SCDs in anticipation of possible procedure. Code Status:  Full code. Family Communication: Discussed with patient's husband. Disposition Plan: Home. Consults called: Neurology. Admission status: Observation.   Eduard Clos MD [facility] Pager [phone].  If 7PM-7AM, please contact night-coverage [email or web address] Password [facility]  02/04/2018, 4:34 AM

## 2018-02-04 NOTE — Progress Notes (Signed)

## 2018-02-04 NOTE — ED Notes (Signed)
Patient is able to eat without difficulty and tolerated it well.  [facility].  Still complaining of right hip pain.  Family at bedside and per family, this is new to the patient.

## 2018-02-04 NOTE — Procedures (Signed)
HPI:  72 y/o with MS change  TECHNICAL SUMMARY:  A multichannel referential and bipolar montage EEG using the standard international 10-20 system was performed on the patient described as awake and drowsy.  The dominant background activity consists of 9 hertz activity seen most prominantly over the posterior head region.  The backgound activity is reactive to eye opening and closing procedures.  Low voltage fast (beta) activity is distributed symmetrically and maximally over the anterior head regions.  ACTIVATION:  Stepwise photic stimulation at 4-20 flashes per second was performed and did not elicit any abnormal waveforms.  Hyperventilation was not performed.  EPILEPTIFORM ACTIVITY:  There were no spikes, sharp waves or paroxysmal activity.  SLEEP: Both stage I and stage II sleep architecture are identified.  CARDIAC:  The EKG lead revealed a regular sinus rhythm.  IMPRESSION:  This is a normal EEG for the patients stated age.  There were no focal, hemispheric or lateralizing features.  No epileptiform activity was recorded.  A normal EEG does not exclude the diagnosis of a seizure disorder and if seizure remains high on the list of differential diagnosis, an ambulatory EEG may be of value.  Clinical correlation is required.

## 2018-02-05 ENCOUNTER — Observation Stay (HOSPITAL_COMMUNITY): Payer: Medicare Other

## 2018-02-05 DIAGNOSIS — R413 Other amnesia: Secondary | ICD-10-CM | POA: Diagnosis not present

## 2018-02-05 DIAGNOSIS — R4182 Altered mental status, unspecified: Secondary | ICD-10-CM | POA: Diagnosis not present

## 2018-02-05 DIAGNOSIS — M25551 Pain in right hip: Secondary | ICD-10-CM | POA: Diagnosis not present

## 2018-02-05 DIAGNOSIS — R410 Disorientation, unspecified: Secondary | ICD-10-CM | POA: Diagnosis not present

## 2018-02-05 DIAGNOSIS — I10 Essential (primary) hypertension: Secondary | ICD-10-CM | POA: Diagnosis not present

## 2018-02-05 LAB — BASIC METABOLIC PANEL
Anion gap: 12 (ref 5–15)
BUN: 15 mg/dL (ref 6–20)
CALCIUM: 8.9 mg/dL (ref 8.9–10.3)
CO2: 21 mmol/L — ABNORMAL LOW (ref 22–32)
CREATININE: 0.93 mg/dL (ref 0.44–1.00)
Chloride: 110 mmol/L (ref 101–111)
GFR, EST NON AFRICAN AMERICAN: 60 mL/min — AB (ref >60)
GLUCOSE: 110 mg/dL — AB (ref 65–99)
Potassium: 3.5 mmol/L (ref 3.5–5.1)
Sodium: 143 mmol/L (ref 135–145)

## 2018-02-05 LAB — CBC
HCT: 41.2 % (ref 36.0–46.0)
Hemoglobin: 13.4 g/dL (ref 12.0–15.0)
MCH: 29.1 pg (ref 26.0–34.0)
MCHC: 32.5 g/dL (ref 30.0–36.0)
MCV: 89.6 fL (ref 78.0–100.0)
PLATELETS: 187 10*3/uL (ref 150–400)
RBC: 4.6 MIL/uL (ref 3.87–5.11)
RDW: 13.4 % (ref 11.5–15.5)
WBC: 7.3 10*3/uL (ref 4.0–10.5)

## 2018-02-05 LAB — URINE CULTURE

## 2018-02-05 LAB — GLUCOSE, CAPILLARY
Glucose-Capillary: 112 mg/dL — ABNORMAL HIGH (ref 65–99)
Glucose-Capillary: 92 mg/dL (ref 65–99)
Glucose-Capillary: 107 mg/dL — ABNORMAL HIGH (ref 65–99)

## 2018-02-05 MED ORDER — LORAZEPAM 2 MG/ML IJ SOLN
0.5000 mg | Freq: Once | INTRAMUSCULAR | Status: AC
  Administered 2018-02-05: 0.5 mg via INTRAVENOUS

## 2018-02-05 NOTE — Consult Note (Signed)
Reason for Consult:Right hip pain Referring Physician: C Shelby Robinson is an 72 y.o. female.  HPI: Shelby Robinson has had chronic right hip pain ever since a hip arthroplasty about 4 years ago by Dr. Baron Robinson in [facility]  She describes daily chronic pain with intermittent flares that cause severe pain.  From history it sounds like Dr. Baron Robinson did not feel he had anything else to offer.  She went to see Dr. Alvan Robinson late last year where it sounds like she had an MRI that showed a large fluid collection in the area.  This was aspirated by interventional radiology at [facility] radiology where they drew 2 vials of bloody fluid and told her that it was "infection".  By history it sounds like Dr. Alvan Robinson did not have anything else to offer except surgery but was vague as to what he would do and the visit ended antagonistically.  She was having 1 of her flares last week and struck herself in the area of her trochanteric bursa very hard and the pain went away.  She was essentially symptom-free for 3 or 4 days when, while doing laundry, it flared up worse than it ever has and she was unable to move her leg.  She was brought to the emergency department by her husband and was not coherent.  She underwent workup for stroke which was negative.  An MRI of the lumbar spine done here showed some extension of L4-L5 disc herniation compressing the right nerve root.  MRI of the pelvis showed a mild right hip synovitis and mild right trochanteric bursitis with no significant joint effusion.  She describes the pain as mild to severe and usually concentrated in the groin or in the lateral aspect of her thigh.  She denies any numbness or tingling.  She has had a left hip arthroplasty but has never caused her problems.  Past Medical History:  Diagnosis Date  . B12 deficiency   . 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 Constipation   . Diabetes mellitus without complication ([place])   . GERD (gastroesophageal  reflux disease)   . Hypertension   . Hyperthyroidism   . Lumbar spondylosis   . Osteoarthritis   . Osteoarthritis of lumbar spine   . Synovitis of hip   . Tarlov cyst     Past Surgical History:  Procedure Laterality Date  . cataract    . ELBOW SURGERY Right   . HIP SURGERY     Bilateral THR  . TOTAL HIP ARTHROPLASTY    . VAGINAL HYSTERECTOMY      Family History  Problem Relation Age of Onset  . Alzheimer's disease Mother   . Stroke Father   . CAD Father 77       CABG  . Coarctation of the aorta Daughter     Social History:  reports that  has never smoked. she has never used smokeless tobacco. She reports that she does not drink alcohol or use drugs.  Allergies:  Allergies  Allergen Reactions  . Angiotensin I, Human [Angiotensin] Swelling    Suspect angioedema  . Flagyl [Metronidazole] Anaphylaxis and Hives  . Codeine Nausea Only, Palpitations and Other (See Comments)    headache  . Nalbuphine Other (See Comments)     Mental Status Changes   . Ace Inhibitors   . Angiotensin Receptor Blockers   . Other     To BP meds-unable to remember; tree nuts   . Cottonseed Oil  Hives  . Nickel Hives    Medications: I have reviewed the patient's current medications.  Results for orders placed or performed during the hospital encounter of 02/03/18 (from the past 48 hour(s))  CBC with Differential     Status: None   Collection Time: 02/03/18  7:25 PM  Result Value Ref Range   WBC 9.5 4.0 - 10.5 K/uL   RBC 4.91 3.87 - 5.11 MIL/uL   Hemoglobin 14.4 12.0 - 15.0 g/dL   HCT 42.6 36.0 - 46.0 %   MCV 86.8 78.0 - 100.0 fL   MCH 29.3 26.0 - 34.0 pg   MCHC 33.8 30.0 - 36.0 g/dL   RDW 13.0 11.5 - 15.5 %   Platelets 221 150 - 400 K/uL   Neutrophils Relative % 61 %   Neutro Abs 5.7 1.7 - 7.7 K/uL   Lymphocytes Relative 30 %   Lymphs Abs 2.9 0.7 - 4.0 K/uL   Monocytes Relative 8 %   Monocytes Absolute 0.8 0.1 - 1.0 K/uL   Eosinophils Relative 1 %   Eosinophils Absolute 0.1 0.0  - 0.7 K/uL   Basophils Relative 0 %   Basophils Absolute 0.0 0.0 - 0.1 K/uL    Comment: Performed at [facility], [address] [place], [place] [address]  Comprehensive metabolic panel     Status: Abnormal   Collection Time: 02/03/18  7:25 PM  Result Value Ref Range   Sodium 138 135 - 145 mmol/L   Potassium 3.4 (L) 3.5 - 5.1 mmol/L   Chloride 104 101 - 111 mmol/L   CO2 22 22 - 32 mmol/L   Glucose, Bld 105 (H) 65 - 99 mg/dL   BUN 14 6 - 20 mg/dL   Creatinine, Ser 0.74 0.44 - 1.00 mg/dL   Calcium 9.3 8.9 - 10.3 mg/dL   Total Protein 7.1 6.5 - 8.1 g/dL   Albumin 4.3 3.5 - 5.0 g/dL   AST 23 15 - 41 U/L   ALT 17 14 - 54 U/L   Alkaline Phosphatase 80 38 - 126 U/L   Total Bilirubin 0.8 0.3 - 1.2 mg/dL   GFR calc non Af Amer >60 >60 mL/min   GFR calc Af Amer >60 >60 mL/min    Comment: (NOTE) The eGFR has been calculated using the CKD EPI equation. This calculation has not been validated in all clinical situations. eGFR's persistently <60 mL/min signify possible Chronic Kidney Disease.    Anion gap 12 5 - 15    Comment: Performed at [facility], [address] [place], [place] [address]  Ammonia     Status: None   Collection Time: 02/03/18  8:38 PM  Result Value Ref Range   Ammonia 14 9 - 35 umol/L    Comment: Performed at [facility], [address] [place], [place] [address]  TSH     Status: None   Collection Time: 02/03/18  8:38 PM  Result Value Ref Range   TSH 1.355 0.350 - 4.500 uIU/mL    Comment: Performed by a 3rd Generation assay with a functional sensitivity of <=0.01 uIU/mL. Performed at [facility], [place] [address] [place], [place] [address]   Basic metabolic panel     Status: Abnormal   Collection Time: 02/04/18  4:33 AM  Result Value Ref Range   Sodium 142 135 - 145 mmol/L   Potassium 3.7 3.5 - 5.1 mmol/L   Chloride 106 101 - 111 mmol/L  CO2 22 22 - 32 mmol/L   Glucose, Bld 116 (H) 65 - 99 mg/dL   BUN 11 6 - 20  mg/dL   Creatinine, Ser 0.93 0.44 - 1.00 mg/dL   Calcium 9.2 8.9 - 10.3 mg/dL   GFR calc non Af Amer 60 (L) >60 mL/min   GFR calc Af Amer >60 >60 mL/min    Comment: (NOTE) The eGFR has been calculated using the CKD EPI equation. This calculation has not been validated in all clinical situations. eGFR's persistently <60 mL/min signify possible Chronic Kidney Disease.    Anion gap 14 5 - 15    Comment: Performed at [facility] [address] [place] [address]  CBC     Status: None   Collection Time: 02/04/18  4:33 AM  Result Value Ref Range   WBC 7.5 4.0 - 10.5 K/uL   RBC 4.57 3.87 - 5.11 MIL/uL   Hemoglobin 13.5 12.0 - 15.0 g/dL   HCT 40.8 36.0 - 46.0 %   MCV 89.3 78.0 - 100.0 fL   MCH 29.5 26.0 - 34.0 pg   MCHC 33.1 30.0 - 36.0 g/dL   RDW 13.7 11.5 - 15.5 %   Platelets 196 150 - 400 K/uL    Comment: Performed at [facility], [place] [address] [place], [place] [address]  Vitamin B12     Status: None   Collection Time: 02/04/18  6:14 AM  Result Value Ref Range   Vitamin B-12 891 180 - 914 pg/mL    Comment: (NOTE) This assay is not validated for testing neonatal or myeloproliferative syndrome specimens for Vitamin B12 levels. Performed at [facility], [place] [address] [place], [place] [address]   Glucose, capillary     Status: Abnormal   Collection Time: 02/04/18  6:51 AM  Result Value Ref Range   Glucose-Capillary 113 (H) 65 - 99 mg/dL   Comment 1 Notify RN    Comment 2 Document in Chart   Glucose, capillary     Status: Abnormal   Collection Time: 02/04/18 11:40 AM  Result Value Ref Range   Glucose-Capillary 107 (H) 65 - 99 mg/dL  Glucose, capillary     Status: Abnormal   Collection Time: 02/04/18  3:49 PM  Result Value Ref Range   Glucose-Capillary 169 (H) 65 - 99 mg/dL   Comment 1 Notify RN    Comment 2 Document in Chart   Glucose, capillary     Status: None   Collection Time: 02/04/18  9:30 PM  Result Value Ref Range    Glucose-Capillary 91 65 - 99 mg/dL   Comment 1 Notify RN    Comment 2 Document in Chart   CBC     Status: None   Collection Time: 02/05/18  5:28 AM  Result Value Ref Range   WBC 7.3 4.0 - 10.5 K/uL   RBC 4.60 3.87 - 5.11 MIL/uL   Hemoglobin 13.4 12.0 - 15.0 g/dL   HCT 41.2 36.0 - 46.0 %   MCV 89.6 78.0 - 100.0 fL   MCH 29.1 26.0 - 34.0 pg   MCHC 32.5 30.0 - 36.0 g/dL   RDW 13.4 11.5 - 15.5 %   Platelets 187 150 - 400 K/uL    Comment: Performed at [facility], [place]. [address] [place], [place] [address]  Basic metabolic panel     Status: Abnormal   Collection Time: 02/05/18  5:28 AM  Result Value Ref Range  Sodium 143 135 - 145 mmol/L   Potassium 3.5 3.5 - 5.1 mmol/L   Chloride 110 101 - 111 mmol/L   CO2 21 (L) 22 - 32 mmol/L   Glucose, Bld 110 (H) 65 - 99 mg/dL   BUN 15 6 - 20 mg/dL   Creatinine, Ser 0.93 0.44 - 1.00 mg/dL   Calcium 8.9 8.9 - 10.3 mg/dL   GFR calc non Af Amer 60 (L) >60 mL/min   GFR calc Af Amer >60 >60 mL/min    Comment: (NOTE) The eGFR has been calculated using the CKD EPI equation. This calculation has not been validated in all clinical situations. eGFR's persistently <60 mL/min signify possible Chronic Kidney Disease.    Anion gap 12 5 - 15    Comment: Performed at [facility] [address] [place], [place] [address]  Glucose, capillary     Status: Abnormal   Collection Time: 02/05/18  6:34 AM  Result Value Ref Range   Glucose-Capillary 112 (H) 65 - 99 mg/dL  Glucose, capillary     Status: None   Collection Time: 02/05/18 11:21 AM  Result Value Ref Range   Glucose-Capillary 92 65 - 99 mg/dL   Comment 1 Notify RN    Comment 2 Document in Chart     Dg Chest 2 View  Result Date: 02/03/2018 CLINICAL DATA:  Cough and left-sided chest pain with weakness since Friday. EXAM: CHEST  2 VIEW COMPARISON:  04/02/2016 FINDINGS: The heart size and mediastinal contours are within normal limits. Both lungs are clear. The visualized skeletal  structures are unremarkable. IMPRESSION: No active cardiopulmonary disease. Electronically Signed   By: Ashley Royalty M.D.   On: 02/03/2018 20:30   Ct Head Wo Contrast  Result Date: 02/03/2018 CLINICAL DATA:  Altered level of consciousness EXAM: CT HEAD WITHOUT CONTRAST TECHNIQUE: Contiguous axial images were obtained from the base of the skull through the vertex without intravenous contrast. COMPARISON:  Report from 01/01/2009 FINDINGS: BRAIN: There is sulcal and ventricular prominence consistent with superficial and central atrophy. No intraparenchymal hemorrhage, mass effect nor midline shift. Periventricular and subcortical white matter hypodensities consistent with chronic small vessel ischemic disease are identified. No acute large vascular territory infarcts. No abnormal extra-axial fluid collections. Basal cisterns are not effaced and midline. VASCULAR: Moderate calcific atherosclerosis of the carotid siphons. SKULL: No skull fracture. No significant scalp soft tissue swelling. SINUSES/ORBITS: The mastoid air-cells are clear. The included paranasal sinuses are well-aerated.The included ocular globes and orbital contents are non-suspicious. Bilateral cataract extractions. OTHER: None. IMPRESSION: Atrophy with chronic small vessel ischemic disease. No acute intracranial abnormality. Electronically Signed   By: Ashley Royalty M.D.   On: 02/03/2018 20:29   Mr Brain Wo Contrast  Result Date: 02/05/2018 CLINICAL DATA:  72 year old female with confusion, unexplained altered mental status. EXAM: MRI HEAD WITHOUT CONTRAST TECHNIQUE: Multiplanar, multiecho pulse sequences of the brain and surrounding structures were obtained without intravenous contrast. COMPARISON:  Head CT without contrast 02/03/2018. Report of [facility] brain MRI 06/08/2004 (no images available). FINDINGS: Brain: No restricted diffusion to suggest acute infarction. No midline shift, mass effect, evidence of mass lesion,  ventriculomegaly, extra-axial collection or acute intracranial hemorrhage. Cervicomedullary junction and pituitary are within normal limits. Pearline Cables and white matter signal is within normal limits for age throughout the brain. No cortical encephalomalacia. No definite chronic cerebral blood products. Bilateral temporal lobe for age. Structures appear within normal limits Vascular: Major intracranial vascular flow voids are preserved. Mild generalized  intracranial artery tortuosity. Skull and upper cervical spine: Normal visible cervical spine. Normal bone marrow signal. Sinuses/Orbits: Postoperative changes to both globes. Otherwise normal orbits soft tissues. Scattered mostly mild paranasal sinus mucosal thickening. Small right maxillary alveolar recess mucous retention cysts. Other: Mastoid air cells are clear. Visible internal auditory structures appear normal. Scalp soft tissues appear negative. There is a small nonspecific 15 millimeter asymmetric soft tissue nodule along the anterior aspect of the left parotid space (series 5, image 23 and series 10, image 18). Otherwise the visible face soft tissues appear normal. IMPRESSION: 1. No acute intracranial abnormality and normal for age noncontrast MRI appearance of the brain. 2. Small nonspecific 15 mm soft tissue nodule along the anterior left parotid space. 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 Recommend outpatient ENT follow-up. Electronically Signed   By: Genevie Ann M.D.   On: 02/05/2018 11:03   Mr Lumbar Spine Wo Contrast  Result Date: 02/05/2018 CLINICAL DATA:  Chronic low back and right hip pain. EXAM: MRI LUMBAR SPINE WITHOUT CONTRAST TECHNIQUE: Multiplanar, multisequence MR imaging of the lumbar spine was performed. No intravenous contrast was administered. COMPARISON:  MRI lumbar spine 10/28/2012. FINDINGS: Segmentation:  Standard. Alignment: Trace anterolisthesis L3 on L4 is unchanged. Chronic bilateral L5 pars interarticularis  defects without anterolisthesis L5 on S1 are again seen. Vertebrae: No fracture or worrisome lesion. Scattered degenerative endplate signal change is worst at L2-3 and L3-4. Conus medullaris and cauda equina: Conus extends to the L1 level. Conus and cauda equina appear normal. Paraspinal and other soft tissues: T2 hyperintense lesions in the kidneys compatible with cysts are unchanged. Disc levels: T11-12 is imaged in the sagittal plane only and negative. T12-L1: Negative. L1-2: There is a shallow disc bulge without central canal or foraminal stenosis, unchanged. L2-3: Loss of disc space height and a disc bulge with endplate spur eccentric to the left are identified. Endplate spur is more prominent than on the prior examination. There is facet arthropathy and some ligamentum flavum thickening. Moderate central canal stenosis and narrowing in the subarticular recesses appear unchanged. The right foramen is open. Mild to moderate left foraminal narrowing is unchanged. L3-4: Facet degenerative disease, disc bulge and superimposed small central protrusion are unchanged. Moderate central canal stenosis and narrowing in the subarticular recesses is again seen. The foramina are open. L4-5: There is a disc bulge with a superimposed down turning right paracentral protrusion. The protrusion has increased in size since the prior examination and impinges on the descending L5 root. Mild to moderate central canal stenosis is present overall at this level. Foramina are open. L5-S1: Perineural cyst on the right is unchanged. There is a shallow disc bulge without stenosis. IMPRESSION: Some increase in the size of a down turning right paracentral protrusion at L4-5. The disc impinges on the descending right L5 root. There is mild to moderate central canal stenosis overall at L4-5. No change in a shallow disc bulge and small down turning right paracentral protrusion at L3-4. Moderate central canal stenosis and narrowing in the  subarticular recesses appear unchanged. No change in moderate central canal stenosis and narrowing in the subarticular recesses at L2-3 where there is a disc bulge and endplate spur. Chronic bilateral L5 pars interarticularis defects without anterolisthesis L5 on S1, unchanged Electronically Signed   By: Inge Rise M.D.   On: 02/05/2018 11:15   Mr Hip Right Wo Contrast  Result Date: 02/05/2018 CLINICAL DATA:  Chronic right hip pain which has acutely worsened over the  past several days. The patient has a history of right hip replacement prior to 02/16/2015. EXAM: MR OF THE RIGHT HIP WITHOUT CONTRAST TECHNIQUE: Multiplanar, multisequence MR imaging was performed. No intravenous contrast was administered. COMPARISON:  Plain films right hip 02/03/2018. CT abdomen and pelvis 02/16/2015. FINDINGS: Artifact reduction techniques were used in this patient with bilateral hip replacements. 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 Bone marrow signal is normal throughout without evidence of infectious process, fracture, stress change or focal lesion. The symphysis pubis and SI joints appear normal. Articular cartilage and labrum Articular cartilage:  Not applicable. Labrum:  Not applicable. Joint or bursal effusion Joint effusion: None. There is mild synovial thickening about the neck of the femoral prosthesis on the right. No bony resorption is identified. Bursae: A trace amount of fluid is present in the right trochanteric bursa. Otherwise negative. Muscles and tendons Muscles and tendons: There is edema in the right adductor brevis and magnus muscles. Adductor musculature demonstrates atrophy bilaterally, worse on the left. Other findings Miscellaneous: Imaged intrapelvic contents demonstrate sigmoid diverticulosis. The patient is status post hysterectomy. IMPRESSION: Findings compatible with mild synovitis about the patient's right hip arthroplasty. Atrophy of the adductor brevis and magnus muscles  bilaterally is worse on the left. Mild edema in the right adductor muscles is likely related to atrophy. No muscle tear is identified Trace amount of fluid in the right trochanteric bursa compatible with bursitis. Electronically Signed   By: Inge Rise M.D.   On: 02/05/2018 11:32   Dg Hip Unilat W Or Wo Pelvis 2-3 Views Right  Result Date: 02/03/2018 CLINICAL DATA:  Right hip pain. Remote history of total hip arthroplasty. EXAM: DG HIP (WITH OR WITHOUT PELVIS) 2-3V RIGHT COMPARISON:  CT scan 02/16/2015 FINDINGS: There are bilateral total hip arthroplasties which appears stable. No complicating features are identified. The pubic symphysis and SI joints are intact. No pelvic fractures or bone lesions. IMPRESSION: Bilateral hip prosthesis without complicating features. Intact bony pelvis. Electronically Signed   By: Marijo Sanes M.D.   On: 02/03/2018 21:23    Review of Systems  Constitutional: Negative for weight loss.  HENT: Negative for ear discharge, ear pain, hearing loss and tinnitus.   Eyes: Negative for blurred vision, double vision, photophobia and pain.  Respiratory: Negative for cough, sputum production and shortness of breath.   Cardiovascular: Negative for chest pain.  Gastrointestinal: Negative for abdominal pain, nausea and vomiting.  Genitourinary: Negative for dysuria, flank pain, frequency and urgency.  Musculoskeletal: Positive for joint pain (Right hip). Negative for back pain, falls, myalgias and neck pain.  Neurological: Negative for dizziness, tingling, sensory change, focal weakness, loss of consciousness and headaches.  Endo/Heme/Allergies: Does not bruise/bleed easily.  Psychiatric/Behavioral: Positive for hallucinations and memory loss. Negative for depression and substance abuse. The patient is not nervous/anxious.    Blood pressure 138/73, pulse 77, temperature 98.3 F (36.8 C), temperature source Oral, resp. rate 16, height 5' 4"  (1.626 m), weight 73.2 kg (161 lb  6 oz), SpO2 97 %. Physical Exam  Constitutional: She appears well-developed and well-nourished. No distress.  HENT:  Head: Normocephalic and atraumatic.  Eyes: Conjunctivae are normal. Right eye exhibits no discharge. Left eye exhibits no discharge. No scleral icterus.  Neck: Normal range of motion.  Cardiovascular: Normal rate and regular rhythm.  Respiratory: Effort normal. No respiratory distress.  Musculoskeletal:  RLE No traumatic wounds, ecchymosis, or rash  Mod TTP over bursa  SLR limited to about 6 inches, NL contralateral, pain with hip internal rotation>external rotation, flexion only mild pain  No knee or ankle effusion  Knee stable to varus/ valgus and anterior/posterior stress  Sens DPN, SPN, TN intact  Motor EHL, ext, flex, evers 5/5  DP 2+, PT 1+, No significant edema  LLE No traumatic wounds, ecchymosis, or rash  Nontender  No knee or ankle effusion  Knee stable to varus/ valgus and anterior/posterior stress  Sens DPN, SPN, TN intact  Motor EHL, ext, flex, evers 5/5  DP 2+, PT 1+, No significant edema  Neurological: She is alert.  Skin: Skin is warm and dry. She is not diaphoretic.  Psychiatric: She has a normal mood and affect. Her behavior is normal.    Assessment/Plan: Right hip pain --it seems clear that there is something going on with her right hip but nothing that needs acute inpatient intervention.  The differential is broad and includes implant reactivity, mechanical issues, reactive synovitis, referred pain from lumbar radiculopathy, and others.  This will be best worked up as an outpatient.  She may see Dr. Percell Miller or Dr. Noemi Chapel (who she is seen in the past) in the office.  She may be discharged from an orthopedic standpoint.  In the short-term I would recommend continuation of NSAIDs and ambulation with a rolling walker as needed.  If the attending physician feels her diabetes and mental status could tolerate it a short burst of prednisone (e.g.  40-60 mg/day x 5 days) may be beneficial.  Should she be discharged tonight please communicate the above to her, otherwise I will see her in the morning and let her know what the plan is.    Shelby Abu, PA-C Orthopedic Surgery [phone] 02/05/2018, 4:06 PM

## 2018-02-05 NOTE — Progress Notes (Signed)
Neurology Progress Note   S:// Seen and examined.  Reports no new events.  Reports still being a little confused but much better than when she presented. Additional history obtained from patient and her husband: Her brother has been diagnosed with CADASIL recently.  He is in his 5550s. No other family member currently with similar diagnosis. She continues to complain of some mild headache, which is different than her migraines that she is to get in the past and has not had for 10 years.   O:// Current vital signs: BP 138/73 (BP Location: Right Arm)   Pulse 77   Temp 98.3 F (36.8 C) (Oral)   Resp 16   Ht 5\' 4"  (1.626 m)   Wt 73.2 kg (161 lb 6 oz)   SpO2 97%   BMI 27.70 kg/m   Vital signs in last 24 hours: Temp:  [97.4 F (36.3 C)-98.5 F (36.9 C)] 98.3 F (36.8 C) (02/20 16100833) Pulse Rate:  [66-77] 77 (02/20 0833) Resp:  [16-18] 16 (02/20 0833) BP: (126-155)/(68-94) 138/73 (02/20 0833) SpO2:  [95 %-98 %] 97 % (02/20 0833) GENERAL: Awake, alert in NAD HEENT: - Normocephalic and atraumatic, dry mm, no LN++, no Thyromegally LUNGS - Clear to auscultation bilaterally with no wheezes CV - S1S2 RRR, no m/r/g, equal pulses bilaterally. ABDOMEN - Soft, nontender, nondistended with normoactive BS Ext: warm, well perfused, intact peripheral pulses, no edema NEURO:  Mental Status: AA&Ox3  Language: speech is not dysarthric.  Naming, repetition, fluency, and comprehension intact. Mildly reduced attention concentration Cranial Nerves: PERRL EOMI, visual fields full, no facial asymmetry,facial sensation intact, hearing intact, tongue/uvula/soft palate midline, normal sternocleidomastoid and trapezius muscle strength. No evidence of tongue atrophy or fibrillations Motor: 5/5 in both upper extremities.  Some limitation in examination due to pain in her lower extremities but no focal weakness. Tone: is normal and bulk is normal Sensation- Intact to light touch bilaterally Coordination: FTN  intact bilaterally, no ataxia in BLE. Gait- deferred   Medications  Current Facility-Administered Medications:  .  acetaminophen (TYLENOL) tablet 650 mg, 650 mg, Oral, Q6H PRN, 650 mg at 02/05/18 0255 **OR** acetaminophen (TYLENOL) suppository 650 mg, 650 mg, Rectal, Q6H PRN, Eduard ClosKakrakandy, Arshad N, MD .  amLODipine (NORVASC) tablet 2.5 mg, 2.5 mg, Oral, Daily, Eduard ClosKakrakandy, Arshad N, MD, 2.5 mg at 02/04/18 1033 .  aspirin chewable tablet 81 mg, 81 mg, Oral, Daily, Eduard ClosKakrakandy, Arshad N, MD, 81 mg at 02/04/18 1032 .  famotidine (PEPCID) tablet 20 mg, 20 mg, Oral, BID, Eduard ClosKakrakandy, Arshad N, MD, 20 mg at 02/04/18 2033 .  insulin aspart (novoLOG) injection 0-9 Units, 0-9 Units, Subcutaneous, TID WC, Eduard ClosKakrakandy, Arshad N, MD, 2 Units at 02/04/18 1737 .  LORazepam (ATIVAN) injection 0.5 mg, 0.5 mg, Intravenous, Once, [place], [place] N, DO .  LORazepam (ATIVAN) injection 1 mg, 1 mg, Intravenous, Once, Rai, Ripudeep K, MD .  metoprolol succinate (TOPROL-XL) 24 hr tablet 200 mg, 200 mg, Oral, Daily, Eduard ClosKakrakandy, Arshad N, MD, 200 mg at 02/04/18 1033 .  ondansetron (ZOFRAN) tablet 4 mg, 4 mg, Oral, Q6H PRN **OR** ondansetron (ZOFRAN) injection 4 mg, 4 mg, Intravenous, Q6H PRN, Eduard ClosKakrakandy, Arshad N, MD .  vitamin B-12 (CYANOCOBALAMIN) tablet 1,000 mcg, 1,000 mcg, Oral, Daily, Eduard ClosKakrakandy, Arshad N, MD, 1,000 mcg at 02/04/18 1032 Labs CBC    Component Value Date/Time   WBC 7.3 02/05/2018 0528   RBC 4.60 02/05/2018 0528   HGB 13.4 02/05/2018 0528   HCT 41.2 02/05/2018 0528   PLT 187 02/05/2018 0528  MCV 89.6 02/05/2018 0528   MCH 29.1 02/05/2018 0528   MCHC 32.5 02/05/2018 0528   RDW 13.4 02/05/2018 0528   LYMPHSABS 2.9 02/03/2018 1925   MONOABS 0.8 02/03/2018 1925   EOSABS 0.1 02/03/2018 1925   BASOSABS 0.0 02/03/2018 1925    CMP     Component Value Date/Time   NA 143 02/05/2018 0528   K 3.5 02/05/2018 0528   CL 110 02/05/2018 0528   CO2 21 (L) 02/05/2018 0528   GLUCOSE 110 (H) 02/05/2018 0528    BUN 15 02/05/2018 0528   CREATININE 0.93 02/05/2018 0528   CALCIUM 8.9 02/05/2018 0528   PROT 7.1 02/03/2018 1925   ALBUMIN 4.3 02/03/2018 1925   AST 23 02/03/2018 1925   ALT 17 02/03/2018 1925   ALKPHOS 80 02/03/2018 1925   BILITOT 0.8 02/03/2018 1925   GFRNONAA 60 (L) 02/05/2018 0528   GFRAA >60 02/05/2018 0528    Imaging I have reviewed images in epic and the results pertinent to this consultation are: CT-scan of the brain no acute changes. MRI examination of the brain-pending at this time.  EEG-normal with no focal hemispheric or lateralizing features.  Assessment:  71 year old woman presented to the hospital with 1 day history of confusion, repetition memory difficulty along with problems with using her right leg. Unusual to have pain with strokes and would pursue evaluation of her hip from a musculoskeletal perspective per primary team. From a neurological standpoint, initial recommendation to pursue workup for TGA.  EEG negative.  MRI pending.  Another thought was that this could be an ACA stroke affecting the right leg and causing confusion-I think it is worth exploring. She does have a family history of CADASIL, and herself has not been tested.  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  This should give [place] more information into whether her symptoms could be related to CADASIL vs stroke. She will need genetic testing with NOTCH3 gene as outpatient. Symptomatic treatment of headache with Tylenol per primary team Management of the hip pain per primary team.  -- Milon Dikes, MD [facility] Pager: [phone] If 7pm to 7am, please call on call as listed on AMION.

## 2018-02-05 NOTE — Discharge Instructions (Signed)

## 2018-02-05 NOTE — Care Management Obs Status (Signed)
MEDICARE OBSERVATION STATUS NOTIFICATION   Patient Details  Name: Shelby SimaGloria Chiara MRN: [id number] Date of Birth: Jul 12, 1946   Medicare Observation Status Notification Given:  Yes    Lawerance Sabalebbie Wyat Infinger, RN 02/05/2018, 9:55 AM

## 2018-02-05 NOTE — Progress Notes (Signed)
Same day progress note  MRI of the brain reviewed.  No acute changes.  No infarct. No bleed. Also, appearance not suggestive of CADASIL either (patient has family history of first-degree relative with CADASIL and was concerned). Most likely symptoms of memory problems- multifactorial toxic metabolic encephalopathy.  MRI of the L-spine and hip were also done. Reveal multiple abnormalities including progression of the paracentral protrusion at L4-5. Multilevel degenerative disc disease.  Mild synovitis of the right hip arthroplasty.  Mild edema of the right adductor muscles.  Right trochanteric bursitis.   Additional recommendations post imaging: -Patient would benefit from an orthopedic consultation regarding these findings on the hip MRI and L-spine MRI to ascertain if there is any surgical recourse and explanation for rigth leg pain. -She should be evaluated by outpatient neurology for formal neuropsych testing for memory impairment eval if her symptoms persist. Also follow up with genetic testing for NOTCH3 gene due to family h/o CADASIL -I discussed the MRI findings of the brain in detail with the family and answered their questions.  I have also spoken with the primary hospitalist Dr. Margo AyeHall regarding the brain MRI findings and also appraise her on the abnormalities on the MRI of the L-spine and hip.  Further management of the abnormalities of the L-spine and hip per the primary team.  Neurology will be available as needed  -- Milon DikesAshish Demarrion Meiklejohn, MD [facility] Pager: [phone] If 7pm to 7am, please call on call as listed on AMION.

## 2018-02-05 NOTE — Discharge Summary (Signed)
Discharge Summary  Shelby Robinson ZOX:[id number] DOB: Jul 19, 1946  PCP: [name], MD  Admit date: 02/03/2018 Discharge date: 02/05/2018  Time spent: 25 minutes  Recommendations for Outpatient Follow-up:  1. Follow up with your orthopedic surgeon 2. Follow up with neurology, may be set up by your PCP 3. Follow up with your PCP within a week   Discharge Diagnoses:  Active Hospital Problems   Diagnosis Date Noted  . Transient amnesia 02/03/2018  . Right hip pain 02/04/2018  . Hypertension 02/04/2018  . Amnesia 02/04/2018    Resolved Hospital Problems  No resolved problems to display.    Discharge Condition: stable  Diet recommendation: resume previous diet  Vitals:   02/05/18 0833 02/05/18 1615  BP: 138/73 (!) 153/74  Pulse: 77 70  Resp: 16 18  Temp: 98.3 F (36.8 C) 98.2 F (36.8 C)  SpO2: 97% 97%    History of present illness:  72 year old female with hypertension, diabetes, B12 deficiency, presented with confusion and right hip pain, difficult to place weight on the right hip, chronic, worsening. MRI did not reveal any acute findings. Neurology consulted and signed off. Orthopedic surgery consulted for hip pain however her symptoms resolved.  Right hip MRI 02/05/18 revealed: Findings compatible with mild synovitis about the patient's right hip arthroplasty.  Atrophy of the adductor brevis and magnus muscles bilaterally is worse on the left. Mild edema in the right adductor muscles is likely related to atrophy. No muscle tear is identified  Trace amount of fluid in the right trochanteric bursa compatible with bursitis.   Patient advised to follow up with her orthopedic surgeon, PCP, and neurology post hospitalization.  On the day of discharge, the patient was able to ambulate without any difficulty as supervised and reported by her RN Onyeje. She was hemodynamically stable and had no complaints.  Hospital Course:  Principal Problem:   Transient  amnesia Active Problems:   Right hip pain   Hypertension   Amnesia  1. Transient global amnesia -appreciate neurology consult.  MRI brain unremarkable for any acute findings.  2. Right hip pain -MRI of the right hip with findings as stated above. 3. Hypertension-on amlodipine and metoprolol  4. Diabetes mellitus type 2 we will hold metformin while inpatient.  On sliding scale insulin. Resume metformin after discharge.   Procedures:  none  Consultations:  Neurology  Orthopedic surgery   Discharge Exam: BP (!) 153/74 (BP Location: Left Arm)   Pulse 70   Temp 98.2 F (36.8 C) (Oral)   Resp 18   Ht 5\' 4"  (1.626 m)   Wt 73.2 kg (161 lb 6 oz)   SpO2 97%   BMI 27.70 kg/m   General: 72 yo CF WD WN NAD A&O x 3  Cardiovascular: RRR no rubs or gallops  Respiratory: CTA no wheezes or rales   Discharge Instructions You were cared for by a hospitalist during your hospital stay. If you have any questions about your discharge medications or the care you received while you were in the hospital after you are discharged, you can call the unit and asked to speak with the hospitalist on call if the hospitalist that took care of you is not available. Once you are discharged, your primary care physician will handle any further medical issues. Please note that NO REFILLS for any discharge medications will be authorized once you are discharged, as it is imperative that you return to your primary care physician (or establish a relationship with a primary care physician  if you do not have one) for your aftercare needs so that they can reassess your need for medications and monitor your lab values.   Allergies as of 02/05/2018      Reactions   Angiotensin I, Human [angiotensin] Swelling   Suspect angioedema   Flagyl [metronidazole] Anaphylaxis, Hives   Codeine Nausea Only, Palpitations, Other (See Comments)   headache   Nalbuphine Other (See Comments)    Mental Status Changes    Ace Inhibitors     Angiotensin Receptor Blockers    Other    To BP meds-unable to remember; tree nuts    Cottonseed Oil Hives   Nickel Hives      Medication List    TAKE these medications   aspirin 81 MG chewable tablet Chew 1 tablet by mouth daily.   B-12 2500 MCG Tabs Take 2,500 mcg by mouth daily.   Biotin 2500 MCG Caps Take 1 capsule by mouth daily.   Co Q 10 100 MG Caps Take 1 capsule by mouth daily.   fluticasone 50 MCG/ACT nasal spray Commonly known as:  FLONASE Place 1 spray into both nostrils daily as needed for allergies or rhinitis.   meloxicam 7.5 MG tablet Commonly known as:  MOBIC Take 7.5 mg by mouth daily as needed for pain.   metFORMIN 500 MG (MOD) 24 hr tablet Commonly known as:  GLUMETZA Take 500-1,000 mg by mouth See admin instructions. Take 2 tabs in the morning and 1 tab in the evening   metoprolol 200 MG 24 hr tablet Commonly known as:  TOPROL-XL Take 200 mg by mouth daily.   NORVASC 2.5 MG tablet Generic drug:  amLODipine Take 2.5 mg by mouth daily.   OCUVITE EYE HEALTH FORMULA PO Take 1 tablet by mouth every evening.   Probiotic 250 MG Caps Take 1 capsule by mouth every morning.      Allergies  Allergen Reactions  . Angiotensin I, Human [Angiotensin] Swelling    Suspect angioedema  . Flagyl [Metronidazole] Anaphylaxis and Hives  . Codeine Nausea Only, Palpitations and Other (See Comments)    headache  . Nalbuphine Other (See Comments)     Mental Status Changes   . Ace Inhibitors   . Angiotensin Receptor Blockers   . Other     To BP meds-unable to remember; tree nuts   . Cottonseed Oil Hives  . Nickel Hives   Follow-up Information    [name], MD Follow up.   Specialty:  Family Medicine Contact information: [address] [place] [address] [phone]            The results of significant diagnostics from this hospitalization (including imaging, microbiology, ancillary and laboratory) are listed below for  reference.    Significant Diagnostic Studies: Dg Chest 2 View  Result Date: 02/03/2018 CLINICAL DATA:  Cough and left-sided chest pain with weakness since Friday. EXAM: CHEST  2 VIEW COMPARISON:  04/02/2016 FINDINGS: The heart size and mediastinal contours are within normal limits. Both lungs are clear. The visualized skeletal structures are unremarkable. IMPRESSION: No active cardiopulmonary disease. Electronically Signed   By: Tollie Eth M.D.   On: 02/03/2018 20:30   Ct Head Wo Contrast  Result Date: 02/03/2018 CLINICAL DATA:  Altered level of consciousness EXAM: CT HEAD WITHOUT CONTRAST TECHNIQUE: Contiguous axial images were obtained from the base of the skull through the vertex without intravenous contrast. COMPARISON:  Report from 01/01/2009 FINDINGS: BRAIN: There is sulcal and ventricular prominence consistent  with superficial and central atrophy. No intraparenchymal hemorrhage, mass effect nor midline shift. Periventricular and subcortical white matter hypodensities consistent with chronic small vessel ischemic disease are identified. No acute large vascular territory infarcts. No abnormal extra-axial fluid collections. Basal cisterns are not effaced and midline. VASCULAR: Moderate calcific atherosclerosis of the carotid siphons. SKULL: No skull fracture. No significant scalp soft tissue swelling. SINUSES/ORBITS: The mastoid air-cells are clear. The included paranasal sinuses are well-aerated.The included ocular globes and orbital contents are non-suspicious. Bilateral cataract extractions. OTHER: None. IMPRESSION: Atrophy with chronic small vessel ischemic disease. No acute intracranial abnormality. Electronically Signed   By: Tollie Ethavid  Kwon M.D.   On: 02/03/2018 20:29   Mr Brain Wo Contrast  Result Date: 02/05/2018 CLINICAL DATA:  72 year old female with confusion, unexplained altered mental status. EXAM: MRI HEAD WITHOUT CONTRAST TECHNIQUE: Multiplanar, multiecho pulse sequences of the brain  and surrounding structures were obtained without intravenous contrast. COMPARISON:  Head CT without contrast 02/03/2018. Report of [facility] 06/08/2004 (no images available). FINDINGS: Brain: No restricted diffusion to suggest acute infarction. No midline shift, mass effect, evidence of mass lesion, ventriculomegaly, extra-axial collection or acute intracranial hemorrhage. Cervicomedullary junction and pituitary are within normal limits. Wallace CullensGray and white matter signal is within normal limits for age throughout the brain. No cortical encephalomalacia. No definite chronic cerebral blood products. Bilateral temporal lobe for age. Structures appear within normal limits Vascular: Major intracranial vascular flow voids are preserved. Mild generalized intracranial artery tortuosity. Skull and upper cervical spine: Normal visible cervical spine. Normal bone marrow signal. Sinuses/Orbits: Postoperative changes to both globes. Otherwise normal orbits soft tissues. Scattered mostly mild paranasal sinus mucosal thickening. Small right maxillary alveolar recess mucous retention cysts. Other: Mastoid air cells are clear. Visible internal auditory structures appear normal. Scalp soft tissues appear negative. There is a small nonspecific 15 millimeter asymmetric soft tissue nodule along the anterior aspect of the left parotid space (series 5, image 23 and series 10, image 18). Otherwise the visible face soft tissues appear normal. IMPRESSION: 1. No acute intracranial abnormality and normal for age noncontrast MRI appearance of the brain. 2. Small nonspecific 15 mm soft tissue nodule along the anterior left parotid space. 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 Recommend outpatient ENT follow-up. Electronically Signed   By: Odessa FlemingH  Annabella Elford M.D.   On: 02/05/2018 11:03   Mr Lumbar Spine Wo Contrast  Result Date: 02/05/2018 CLINICAL DATA:  Chronic low back and right hip pain. EXAM: MRI LUMBAR  SPINE WITHOUT CONTRAST TECHNIQUE: Multiplanar, multisequence MR imaging of the lumbar spine was performed. No intravenous contrast was administered. COMPARISON:  MRI lumbar spine 10/28/2012. FINDINGS: Segmentation:  Standard. Alignment: Trace anterolisthesis L3 on L4 is unchanged. Chronic bilateral L5 pars interarticularis defects without anterolisthesis L5 on S1 are again seen. Vertebrae: No fracture or worrisome lesion. Scattered degenerative endplate signal change is worst at L2-3 and L3-4. Conus medullaris and cauda equina: Conus extends to the L1 level. Conus and cauda equina appear normal. Paraspinal and other soft tissues: T2 hyperintense lesions in the kidneys compatible with cysts are unchanged. Disc levels: T11-12 is imaged in the sagittal plane only and negative. T12-L1: Negative. L1-2: There is a shallow disc bulge without central canal or foraminal stenosis, unchanged. L2-3: Loss of disc space height and a disc bulge with endplate spur eccentric to the left are identified. Endplate spur is more prominent than on the prior examination. There is facet arthropathy and some ligamentum flavum thickening. Moderate central  canal stenosis and narrowing in the subarticular recesses appear unchanged. The right foramen is open. Mild to moderate left foraminal narrowing is unchanged. L3-4: Facet degenerative disease, disc bulge and superimposed small central protrusion are unchanged. Moderate central canal stenosis and narrowing in the subarticular recesses is again seen. The foramina are open. L4-5: There is a disc bulge with a superimposed down turning right paracentral protrusion. The protrusion has increased in size since the prior examination and impinges on the descending L5 root. Mild to moderate central canal stenosis is present overall at this level. Foramina are open. L5-S1: Perineural cyst on the right is unchanged. There is a shallow disc bulge without stenosis. IMPRESSION: Some increase in the size of  a down turning right paracentral protrusion at L4-5. The disc impinges on the descending right L5 root. There is mild to moderate central canal stenosis overall at L4-5. No change in a shallow disc bulge and small down turning right paracentral protrusion at L3-4. Moderate central canal stenosis and narrowing in the subarticular recesses appear unchanged. No change in moderate central canal stenosis and narrowing in the subarticular recesses at L2-3 where there is a disc bulge and endplate spur. 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   On: 02/05/2018 11:15   Mr Hip Right Wo Contrast  Result Date: 02/05/2018 CLINICAL DATA:  Chronic right hip pain which has acutely worsened over the past several days. The patient has a history of right hip replacement prior to 02/16/2015. EXAM: MR OF THE RIGHT HIP WITHOUT CONTRAST TECHNIQUE: Multiplanar, multisequence MR imaging was performed. No intravenous contrast was administered. COMPARISON:  Plain films right hip 02/03/2018. CT abdomen and pelvis 02/16/2015. FINDINGS: Artifact reduction techniques were used in this patient with bilateral hip replacements. 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 Bone marrow signal is normal throughout without evidence of infectious process, fracture, stress change or focal lesion. The symphysis pubis and SI joints appear normal. Articular cartilage and labrum Articular cartilage:  Not applicable. Labrum:  Not applicable. Joint or bursal effusion Joint effusion: None. There is mild synovial thickening about the neck of the femoral prosthesis on the right. No bony resorption is identified. Bursae: A trace amount of fluid is present in the right trochanteric bursa. Otherwise negative. Muscles and tendons Muscles and tendons: There is edema in the right adductor brevis and magnus muscles. Adductor musculature demonstrates  atrophy bilaterally, worse on the left. Other findings Miscellaneous: Imaged intrapelvic contents demonstrate sigmoid diverticulosis. The patient is status post hysterectomy. IMPRESSION: Findings compatible with mild synovitis about the patient's right hip arthroplasty. Atrophy of the adductor brevis and magnus muscles bilaterally is worse on the left. Mild edema in the right adductor muscles is likely related to atrophy. No muscle tear is identified Trace amount of fluid in the right trochanteric bursa compatible with bursitis. Electronically Signed   By: Drusilla Kanner M.D.   On: 02/05/2018 11:32   Dg Hip Unilat W Or Wo Pelvis 2-3 Views Right  Result Date: 02/03/2018 CLINICAL DATA:  Right hip pain. Remote history of total hip arthroplasty. EXAM: DG HIP (WITH OR WITHOUT PELVIS) 2-3V RIGHT COMPARISON:  CT scan 02/16/2015 FINDINGS: There are bilateral total hip arthroplasties which appears stable. No complicating features are identified. The pubic symphysis and SI joints are intact. No pelvic fractures or bone lesions. IMPRESSION: Bilateral hip prosthesis without complicating features. Intact bony pelvis. Electronically Signed   By: Demetrius Charity.  Gallerani M.D.   On: 02/03/2018 21:23    Microbiology: Recent Results (from the past 240 hour(s))  Urine culture     Status: Abnormal   Collection Time: 02/03/18  2:15 PM  Result Value Ref Range Status   Specimen Description   Final    URINE, RANDOM Performed at [facility], [address] [place], [place] [address]    Special Requests   Final    NONE Performed at [facility], [address] [place], [place] [address]    Culture MULTIPLE SPECIES PRESENT, SUGGEST RECOLLECTION (A)  Final   Report Status 02/05/2018 FINAL  Final     Labs: Basic Metabolic Panel: Recent Labs  Lab 02/03/18 1925 02/04/18 0433 02/05/18 0528  NA 138 142 143  K 3.4* 3.7 3.5  CL 104 106 110  CO2 22 22 21*  GLUCOSE 105* 116* 110*  BUN 14 11 15     CREATININE 0.74 0.93 0.93  CALCIUM 9.3 9.2 8.9   Liver Function Tests: Recent Labs  Lab 02/03/18 1925  AST 23  ALT 17  ALKPHOS 80  BILITOT 0.8  PROT 7.1  ALBUMIN 4.3   No results for input(s): LIPASE, AMYLASE in the last 168 hours. Recent Labs  Lab 02/03/18 2038  AMMONIA 14   CBC: Recent Labs  Lab 02/03/18 1925 02/04/18 0433 02/05/18 0528  WBC 9.5 7.5 7.3  NEUTROABS 5.7  --   --   HGB 14.4 13.5 13.4  HCT 42.6 40.8 41.2  MCV 86.8 89.3 89.6  PLT 221 196 187   Cardiac Enzymes: No results for input(s): CKTOTAL, CKMB, CKMBINDEX, TROPONINI in the last 168 hours. BNP: BNP (last 3 results) No results for input(s): BNP in the last 8760 hours.  ProBNP (last 3 results) No results for input(s): PROBNP in the last 8760 hours.  CBG: Recent Labs  Lab 02/04/18 1549 02/04/18 2130 02/05/18 0634 02/05/18 1121 02/05/18 1621  GLUCAP 169* 91 112* 92 107*       Signed:  Darlin Drop, MD [facility] 02/05/2018, 11:47 PM

## 2018-02-05 NOTE — Progress Notes (Addendum)
Pt d/c home, no new concerns, pt is stable, walked 200 ft without falling or c/o pain . D/c instructions with teach back done. Pt verbalize understanding.  Pt is transported out of the hospital by family.

## 2018-04-18 ENCOUNTER — Ambulatory Visit (INDEPENDENT_AMBULATORY_CARE_PROVIDER_SITE_OTHER): Payer: Medicare Other | Admitting: Neurology

## 2018-04-18 ENCOUNTER — Encounter: Payer: Self-pay | Admitting: Neurology

## 2018-04-18 VITALS — BP 147/74 | HR 67 | Ht 64.0 in | Wt 159.0 lb

## 2018-04-18 DIAGNOSIS — G454 Transient global amnesia: Secondary | ICD-10-CM

## 2018-04-18 DIAGNOSIS — R413 Other amnesia: Secondary | ICD-10-CM | POA: Diagnosis not present

## 2018-04-18 DIAGNOSIS — G309 Alzheimer's disease, unspecified: Secondary | ICD-10-CM | POA: Diagnosis not present

## 2018-04-18 NOTE — Progress Notes (Signed)
[facility]    Provider:  Dr Lucia Gaskins Referring Provider: [name], MD Primary Care Physician:  [name], MD  CC:  Loss of memory  HPI:  Shelby Robinson is a 72 y.o. female here as a referral from Dr. Riley Nearing for memory loss.  Past medical history diabetes, high blood pressure, right and left hip replacement, right elbow surgery, pelvic mesh, osteoarthritis, hypothyroidism, hypertension, B12 deficiency. She was in her normal state of health, she couldn;t move, thought it was her hip. Went to the Ed, she started forgetting, her blood pressure was "sky high." then everything gets fuzzy after that, she didn't know why she was there, she started repeating things. She doesn't remember most of time in the hospital, she had hallucinations, daughter and husband are here and provide much information. She never recovered most of her memories. She is forgetful, forgets an appointment, repeats the same stories in the same day, mother had dementia started exhibiting in her 57s. No accidents in the home, she is a terrible driver, she gets lost, she sometimes doesn't know which way to go home. She is missing bills at home. Been going on for several years, progressively and slowly getting worse. No other focal neurologic deficits, associated symptoms, inciting events or modifiable factors.  Reviewed notes, labs and imaging from outside physicians, which showed:   Reviewed hospitalization notes.  She was in her normal state of health on waking and she developed acute right hip pain and stiffness there was no jerking or spasming but she states that it was not seeming to work as it should.  It was very painful causing her to cry out and concern her husband.  When patient was in the emergency room she was having some difficulty with cognition she repeatedly had difficulty answering simple questions such as month she was repeating the same story over and over again.  The patient states she  feels that she remembers the bulk of the day.  She was diagnosed with transient global amnesia.  Reviewed EEG report which was normal  MRI of the brain not suggestive of CADASIL, personally reviewed images, no strokes   Review of Systems: Patient complains of symptoms per HPI as well as the following symptoms: Eye pain, fatigue, joint pain, ringing in ears, allergies, memory loss, confusion, headache, dizziness, sleepiness. Pertinent negatives and positives per HPI. All others negative.   Social History   Socioeconomic History  . Marital status: Married    Spouse name: Not on file  . Number of children: 3  . Years of education: Not on file  . Highest education level: Not on file  Occupational History  . Not on file  Social Needs  . Financial resource strain: Not on file  . Food insecurity:    Worry: Not on file    Inability: Not on file  . Transportation needs:    Medical: Not on file    Non-medical: Not on file  Tobacco Use  . Smoking status: Never Smoker  . Smokeless tobacco: Never Used  Substance and Sexual Activity  . Alcohol use: No  . Drug use: No  . Sexual activity: Not on file  Lifestyle  . Physical activity:    Days per week: Not on file    Minutes per session: Not on file  . Stress: Not on file  Relationships  . Social connections:    Talks on phone: Not on file    Gets together: Not on file  Attends religious service: Not on file    Active member of club or organization: Not on file    Attends meetings of clubs or organizations: Not on file    Relationship status: Not on file  . Intimate partner violence:    Fear of current or ex partner: Not on file    Emotionally abused: Not on file    Physically abused: Not on file    Forced sexual activity: Not on file  Other Topics Concern  . Not on file  Social History Narrative   Lives at home with husband.  Two grandsons.     Family History  Problem Relation Age of Onset  . Alzheimer's disease Mother    . Stroke Father   . CAD Father 71       CABG  . Coarctation of the aorta Daughter     Past Medical History:  Diagnosis Date  . B12 deficiency   . 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 Constipation   . Diabetes mellitus without complication (HCC)   . GERD (gastroesophageal reflux disease)   . Hypertension   . Hyperthyroidism   . Lumbar spondylosis   . Osteoarthritis   . Osteoarthritis of lumbar spine   . Synovitis of hip   . Tarlov cyst     Past Surgical History:  Procedure Laterality Date  . cataract    . ELBOW SURGERY Right   . HIP SURGERY     Bilateral THR  . TOTAL HIP ARTHROPLASTY    . VAGINAL HYSTERECTOMY      Current Outpatient Medications  Medication Sig Dispense Refill  . amLODipine (NORVASC) 2.5 MG tablet Take 2.5 mg by mouth daily.    Marland Kitchen aspirin 81 MG chewable tablet Chew 1 tablet by mouth daily.    Marland Kitchen atorvastatin (LIPITOR) 10 MG tablet Take 10 mg by mouth daily.    . Biotin 2500 MCG CAPS Take 1 capsule by mouth daily.    . cholecalciferol (VITAMIN D) 1000 units tablet Take 1,000 Units by mouth daily.    . Coenzyme Q10 (CO Q 10) 100 MG CAPS Take 1 capsule by mouth daily.    . Cyanocobalamin (B-12) 2500 MCG TABS Take 2,500 mcg by mouth daily.     . cyclobenzaprine (FLEXERIL) 10 MG tablet Take 10 mg by mouth 3 (three) times daily as needed for muscle spasms.    . fluticasone (FLONASE) 50 MCG/ACT nasal spray Place 1 spray into both nostrils daily as needed for allergies or rhinitis.    . folic acid (FOLVITE) 400 MCG tablet Take 400 mcg by mouth daily.    . meloxicam (MOBIC) 7.5 MG tablet Take 7.5 mg by mouth daily as needed for pain.     . metFORMIN (GLUMETZA) 500 MG (MOD) 24 hr tablet Take 500 mg by mouth 4 (four) times daily.     . metoprolol (TOPROL-XL) 200 MG 24 hr tablet Take 200 mg by mouth daily.     No current facility-administered medications for this visit.     Allergies as of 04/18/2018 - Review Complete 04/18/2018    Allergen Reaction Noted  . Angiotensin i, human [angiotensin] Swelling 06/15/2014  . Flagyl [metronidazole] Anaphylaxis and Hives 04/20/2013  . Codeine Nausea Only, Palpitations, and Other (See Comments) 04/20/2013  . Nalbuphine Other (See Comments) 06/15/2014  . Ace inhibitors  02/16/2015  . Angiotensin receptor blockers  02/16/2015  . Other  04/20/2013  . Cottonseed oil Hives 06/15/2014  .  Nickel Hives 02/16/2015    Vitals: BP (!) 147/74   Pulse 67   Ht  (1.626 m)   Wt 159 lb (72.1 kg)   BMI 27.29 kg/m  Last Weight:  Wt Readings from Last 1 Encounters:  04/18/18 159 lb (72.1 kg)   Last Height:   Ht Readings from Last 1 Encounters:  04/18/18  (1.626 m)   Physical exam: Exam: Gen: NAD, conversant, well nourised, well groomed                     CV: RRR, no MRG. No Carotid Bruits. No peripheral edema, warm, nontender Eyes: Conjunctivae clear without exudates or hemorrhage  Neuro: Detailed Neurologic Exam  Speech:    Speech is normal; fluent and spontaneous with normal comprehension.  Cognition:    The patient is oriented to person, place, and time;     recent and remote memory intact;     language fluent;     normal attention, concentration,     fund of knowledge Cranial Nerves:    The pupils are equal, round, and reactive to light. The fundi are normal and spontaneous venous pulsations are present. Visual fields are full to finger confrontation. Extraocular movements are intact. Trigeminal sensation is intact and the muscles of mastication are normal. The face is symmetric. The palate elevates in the midline. Hearing intact. Voice is normal. Shoulder shrug is normal. The tongue has normal motion without fasciculations.   Coordination:    Normal finger to nose and heel to shin. Normal rapid alternating movements.   Gait:    Heel-toe and tandem gait are normal.   Motor Observation:    No asymmetry, no atrophy, and no involuntary movements  noted. Tone:    Normal muscle tone.    Posture:    Posture is normal. normal erect    Strength:    Strength is V/V in the upper and lower limbs.      Sensation: intact to LT     Reflex Exam:  DTR's:    Deep tendon reflexes in the upper and lower extremities are symmetrical bilaterally.   Toes:    The toes are downgoing bilaterally.   Clonus:    Clonus is absent.        Assessment/Plan:   72 y.o. female here as a referral from Dr. Riley Nearing for memory loss.  Past medical history diabetes, high blood pressure, right and left hip replacement, right elbow surgery, pelvic mesh, osteoarthritis, hypothyroidism, hypertension, B12 deficiency.  Patient with Transient Global Amnesia. Work up unremarkable She has progressive memory loss and a FHx of dementia: Formal neurocognitive testing, FDG PET Scan. 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 NM PET Metabolic Brain  . Ambulatory referral to Neuropsychology   Cc: [name], MD   Naomie Dean, MD  [facility] [address] [place], [place] [address]  Phone [phone] Fax [phone]

## 2018-04-18 NOTE — Patient Instructions (Signed)
FDG Pet Scan Formal Neurocognitive testing with Dr. Alinda Dooms

## 2018-04-20 ENCOUNTER — Telehealth: Payer: Self-pay | Admitting: Neurology

## 2018-04-20 ENCOUNTER — Encounter: Payer: Self-pay | Admitting: Neurology

## 2018-04-20 DIAGNOSIS — G454 Transient global amnesia: Secondary | ICD-10-CM | POA: Insufficient documentation

## 2018-04-20 NOTE — Telephone Encounter (Signed)
I ordered an FDG PET Scan for this patient to evaluate for Alzheimer's dementia, fyi thanks

## 2018-04-21 ENCOUNTER — Encounter: Payer: Self-pay | Admitting: Psychology

## 2018-04-21 ENCOUNTER — Telehealth: Payer: Self-pay | Admitting: Neurology

## 2018-04-21 NOTE — Telephone Encounter (Signed)
Patient is scheduled For May 21 st at [facility] .  PET Scan

## 2018-04-21 NOTE — Telephone Encounter (Signed)
Patient scheduled for PET Scan at [facility] May 21 st  At  9:30 am  For 10:00 apt .  Check in Radiology 1 st Floor  . Patient needs to be NPO after midnight   [phone]

## 2018-04-24 ENCOUNTER — Ambulatory Visit: Payer: Medicare Other | Admitting: Neurology

## 2018-05-06 ENCOUNTER — Ambulatory Visit (HOSPITAL_COMMUNITY)
Admission: RE | Admit: 2018-05-06 | Discharge: 2018-05-06 | Disposition: A | Discharge status: Home / Self Care | Payer: Medicare Other | Source: Ambulatory Visit | Attending: Neurology | Admitting: Neurology

## 2018-05-06 DIAGNOSIS — G454 Transient global amnesia: Secondary | ICD-10-CM | POA: Insufficient documentation

## 2018-05-06 DIAGNOSIS — R413 Other amnesia: Secondary | ICD-10-CM

## 2018-05-06 DIAGNOSIS — G309 Alzheimer's disease, unspecified: Secondary | ICD-10-CM

## 2018-05-06 LAB — GLUCOSE, CAPILLARY: Glucose-Capillary: 98 mg/dL (ref 65–99)

## 2018-05-06 MED ORDER — FLUDEOXYGLUCOSE F - 18 (FDG) INJECTION
9.9300 | Freq: Once | INTRAVENOUS | Status: AC | PRN
  Administered 2018-05-06: 9.93 via INTRAVENOUS

## 2018-05-08 ENCOUNTER — Telehealth: Payer: Self-pay | Admitting: *Deleted

## 2018-05-08 NOTE — Telephone Encounter (Addendum)
Spoke with patient and informed her that her pet metabolic brain scan does not suggest frontotemporal dementia. Report is on her mychart for her to review if she would like. Results per Dr. Lucia Gaskins says: No decreased relative cortical metabolism to suggest dementia. Patient verbalized understanding and appreciation. RN reviewed with her that she has upcoming appointments for formal neurocognitive testing with Dr. Daneen Schick in October and f/u appt with [facility] in November. Pt had no questions.   ----- Message from Anson Fret, MD sent at 05/07/2018 12:21 PM EDT ----- No decreased relative cortical metabolism to suggest dementia

## 2018-07-31 NOTE — Progress Notes (Signed)
Cardiology Office Note   Date:  08/01/2018   ID:  Shelby SimaGloria Petrovich, DOB 20-Dec-1945, MRN [id number]  PCP:  Angelica ChessmanAguiar, Rafaela M, MD  Cardiologist:   Rollene RotundaJames Aleysia Oltmann, MD  Referring:  Dr. Riley NearingAguiar  Chief Complaint  Patient presents with  . Aortic Regurgitation      History of Present Illness: Shelby Robinson is a 72 y.o. female who presents for evaluation of valve disease. She had a reported history of mitral regurgitation and aortic insufficiency on echo prior to my first appt with her.  I ordered an echo in Nov 2017 which showed mild AI and NL LV EF.  She returns for follow up.  She is going to have hip surgery.    Since I last saw her she has done well.  The patient denies any new symptoms such as chest discomfort, neck or arm discomfort. There has been no new shortness of breath, PND or orthopnea. There have been no reported palpitations, presyncope or syncope.  She is limited by hip pain.  She can do housework and yard work.     Past Medical History:  Diagnosis Date  . B12 deficiency   . 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 Constipation   . Diabetes mellitus without complication (HCC)   . GERD (gastroesophageal reflux disease)   . Hypertension   . Hyperthyroidism   . Lumbar spondylosis   . Osteoarthritis   . Osteoarthritis of lumbar spine   . Synovitis of hip   . Tarlov cyst     Past Surgical History:  Procedure Laterality Date  . cataract    . ELBOW SURGERY Right   . HIP SURGERY     Bilateral THR  . TOTAL HIP ARTHROPLASTY    . VAGINAL HYSTERECTOMY       Current Outpatient Medications  Medication Sig Dispense Refill  . amLODipine (NORVASC) 5 MG tablet Take 5 mg by mouth daily.    Marland Kitchen. aspirin 81 MG chewable tablet Chew 1 tablet by mouth daily.    Marland Kitchen. atorvastatin (LIPITOR) 20 MG tablet Take 20 mg by mouth daily.    . Biotin 2500 MCG CAPS Take 1 capsule by mouth daily.    . cholecalciferol (VITAMIN D) 1000 units tablet Take 1,000 Units by mouth  daily.    . Coenzyme Q10 (CO Q 10) 100 MG CAPS Take 1 capsule by mouth daily.    . Cyanocobalamin (B-12) 2500 MCG TABS Take 2,500 mcg by mouth daily.     . fluticasone (FLONASE) 50 MCG/ACT nasal spray Place 1 spray into both nostrils daily as needed for allergies or rhinitis.    . folic acid (FOLVITE) 400 MCG tablet Take 400 mcg by mouth daily.    . meloxicam (MOBIC) 7.5 MG tablet Take 7.5 mg by mouth daily as needed for pain.     . metFORMIN (GLUMETZA) 500 MG (MOD) 24 hr tablet Take 500 mg by mouth 4 (four) times daily.     . metoprolol (TOPROL-XL) 200 MG 24 hr tablet Take 200 mg by mouth daily.     No current facility-administered medications for this visit.     Allergies:   Angiotensin i, human [angiotensin]; Flagyl [metronidazole]; Codeine; Nalbuphine; Ace inhibitors; Angiotensin receptor blockers; Other; Cottonseed oil; and Nickel   ROS:  Please see the history of present illness.   Otherwise, review of systems are positive for none.   All other systems are reviewed and negative.    PHYSICAL EXAM:  VS:  BP 122/76 (BP Location: Left Arm, Patient Position: Sitting, Cuff Size: Normal)   Pulse 66   Ht 5\' 4"  (1.626 m)   Wt 159 lb (72.1 kg)   BMI 27.29 kg/m  , BMI Body mass index is 27.29 kg/m. GENERAL:  Well appearing NECK:  No jugular venous distention, waveform within normal limits, carotid upstroke brisk and symmetric, no bruits, no thyromegaly LUNGS:  Clear to auscultation bilaterally CHEST:  Unremarkable HEART:  PMI not displaced or sustained,S1 and S2 within normal limits, no S3, no S4, no clicks, no rubs, no murmurs ABD:  Flat, positive bowel sounds normal in frequency in pitch, positive bruits, no rebound, no guarding, no midline pulsatile mass, no hepatomegaly, no splenomegaly EXT:  2 plus pulses throughout, no edema, no cyanosis no clubbing   EKG:  EKG is  ordered today. Sinus rhythm, rate 66, axis within normal limits, intervals within normal limits, poor anterior R wave  progression. This is unchanged from previous EKGs.   Lipid Panel No results found for: CHOL, TRIG, HDL, CHOLHDL, VLDL, LDLCALC, LDLDIRECT    Wt Readings from Last 3 Encounters:  08/01/18 159 lb (72.1 kg)  04/18/18 159 lb (72.1 kg)  02/04/18 161 lb 6 oz (73.2 kg)      Other studies Reviewed: Additional studies/ records that were reviewed today include: None Review of the above records demonstrates:     ASSESSMENT AND PLAN:  AI:  This was mild on echo.  Do not suspect this is changed by exam.  No further imaging is suggested.  HTN:   Her blood pressure was recently elevated and her Norvasc was increased.  She had some nonpitting lower extremity edema and we talked about this.  She chooses to continue the higher dose of Norvasc as it is working on her blood pressure.  She will wear compression stockings.   RESTLESS LEGS:   She no longer has this problem and is off medications.  BRUIT: I noticed an abdominal bruit in the past but there was no aneurysm on ultrasound.  No change in therapy.  PREOP:    Patient is not going for high risk procedure.  She has no high risk findings.  She has a good functional level.  The patient is low risk from cardiovascular standpoint for the planned surgery.  According to ACC/AHA guidelines no further testing is indicated.   Current medicines are reviewed at length with the patient today.  The patient does not have concerns regarding medicines.  The following changes have been made:  None  Labs/ tests ordered today include:   None Abdominal ultrasound.     Disposition:   FU with me 18 months.   Signed, Rollene RotundaJames Alizabeth Antonio, MD  08/01/2018 9:09 AM    [facility]

## 2018-08-01 ENCOUNTER — Encounter: Payer: Self-pay | Admitting: Cardiology

## 2018-08-01 ENCOUNTER — Ambulatory Visit (INDEPENDENT_AMBULATORY_CARE_PROVIDER_SITE_OTHER): Payer: Medicare Other | Admitting: Cardiology

## 2018-08-01 VITALS — BP 122/76 | HR 66 | Ht 64.0 in | Wt 159.0 lb

## 2018-08-01 DIAGNOSIS — I351 Nonrheumatic aortic (valve) insufficiency: Secondary | ICD-10-CM

## 2018-08-01 DIAGNOSIS — Z01810 Encounter for preprocedural cardiovascular examination: Secondary | ICD-10-CM

## 2018-08-01 DIAGNOSIS — I10 Essential (primary) hypertension: Secondary | ICD-10-CM

## 2018-08-01 NOTE — Patient Instructions (Signed)
Medication Instructions:  Continue current medications  If you need a refill on your cardiac medications before your next appointment, please call your pharmacy.  Labwork: None Ordered   Testing/Procedures: None Ordered   Follow-Up: Your physician wants you to follow-up in: 18 Months. You should receive a reminder letter in the mail two months in advance. If you do not receive a letter, please call our office in [phone] to schedule your follow-up appointment.      Thank you for choosing [facility]!!       

## 2018-09-22 ENCOUNTER — Encounter: Payer: Medicare Other | Admitting: Psychology

## 2018-10-16 ENCOUNTER — Encounter: Payer: Medicare Other | Admitting: Psychology

## 2018-10-22 ENCOUNTER — Ambulatory Visit: Payer: Medicare Other | Admitting: Adult Health

## 2019-02-06 ENCOUNTER — Other Ambulatory Visit (HOSPITAL_COMMUNITY): Payer: Self-pay | Admitting: Orthopedic Surgery

## 2019-02-06 ENCOUNTER — Other Ambulatory Visit: Payer: Self-pay | Admitting: Orthopedic Surgery

## 2019-02-06 DIAGNOSIS — Z96641 Presence of right artificial hip joint: Secondary | ICD-10-CM

## 2019-02-11 ENCOUNTER — Encounter (HOSPITAL_COMMUNITY)
Admission: RE | Admit: 2019-02-11 | Discharge: 2019-02-11 | Disposition: A | Discharge status: Home / Self Care | Payer: Medicare Other | Source: Ambulatory Visit | Attending: Orthopedic Surgery | Admitting: Orthopedic Surgery

## 2019-02-11 DIAGNOSIS — Z96641 Presence of right artificial hip joint: Secondary | ICD-10-CM

## 2019-02-11 MED ORDER — TECHNETIUM TC 99M MEDRONATE IV KIT
20.0000 | PACK | Freq: Once | INTRAVENOUS | Status: AC | PRN
  Administered 2019-02-11: 20 via INTRAVENOUS

## 2019-08-26 ENCOUNTER — Telehealth: Payer: Self-pay | Admitting: Cardiology

## 2019-08-26 NOTE — Telephone Encounter (Signed)
We can reduce Norvasc back down to 2.5 mg but I think it would be unlikely that this is causing a rash now.  I would  Be happy to see her in the office or her her come for an APP visit.  Could also be virtual if the issue is rash and she has video capabilities.

## 2019-08-26 NOTE — Telephone Encounter (Signed)
New Message  Pt c/o medication issue:  1. Name of Medication: amLODipine (NORVASC) 5 MG tablet  2. How are you currently taking this medication (dosage and times per day)? Take 5 mg by mouth daily.  3. Are you having a reaction (difficulty breathing--STAT)? No, leg swelling and rash on the top of the foot.   4. What is your medication issue? For the last 2 months, patient has had swelling in both legs and a rash on both feet.   Appointment scheduled for 09/21/19 at 9:00 am

## 2019-08-26 NOTE — Telephone Encounter (Signed)
Spoke with patient. States Dr Percival Spanish increased Amlodipine to 5 mg daily last year (apprears to be around Aug 2019) when patient was last seen. Since then she's experienced periodic swelling of her feet and lower extremities.  Over the past 2 months the swelling has increased in frequency and she's developed a rash. She describes the rash as very red all over; looks like petechiae at times. Sometimes is goes away and looks alright. Saw PCP, Dr Pricilla Holm, in August 2020, who advised her to contact Dr Percival Spanish for further advice. BP runs 120-130/80-90's per patient.  Patient has appointment with Dr Percival Spanish on 09/21/2019 and I advised her to keep. Routing to Dr Percival Spanish for any further advice at this time.

## 2019-08-26 NOTE — Telephone Encounter (Signed)
Advised patient and she will call back in about a week if no improvement. Has appointment in October with Dr Percival Spanish

## 2019-09-20 NOTE — Progress Notes (Signed)
Cardiology Office Note   Date:  09/21/2019   ID:  Shelby Robinson, DOB 10-16-1946, MRN [id number]  PCP:  [name], MD  Cardiologist:   Rollene Rotunda, MD  Referring:  Dr. Riley Nearing  Chief Complaint  Patient presents with  . Edema      History of Present Illness: Shelby Robinson is a 73 y.o. female who presents for evaluation of valve disease. She had a reported history of mitral regurgitation and aortic insufficiency on echo prior to my first appt with her.  I ordered an echo in Nov 2017 which showed mild AI and NL LV EF.  She returns for follow up.  At the last visit her BP was increased so I increased her Norvasc. However, she had rash that she blamed on the increased dose so we went back down.   She says that she has been doing okay since going back on a lower dose.  She has had a little lower extremity swelling.  She denies any new shortness of breath, PND or orthopnea.  She had hip surgery last fall.  She said she has had problems with hoarseness.  She thinks it is been since then.  She has been told she has some fine aspiration and esophageal inflammation.  She is being treated for this.  She had some atypical discomfort in her lower chest that are sharp and shooting.   Past Medical History:  Diagnosis Date  . B12 deficiency   . 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 Constipation   . Diabetes mellitus without complication (HCC)   . GERD (gastroesophageal reflux disease)   . Hypertension   . Hyperthyroidism   . Lumbar spondylosis   . Osteoarthritis   . Osteoarthritis of lumbar spine   . Synovitis of hip   . Tarlov cyst     Past Surgical History:  Procedure Laterality Date  . cataract    . ELBOW SURGERY Right   . HIP SURGERY     Bilateral THR  . TOTAL HIP ARTHROPLASTY    . VAGINAL HYSTERECTOMY       Current Outpatient Medications  Medication Sig Dispense Refill  . amLODipine (NORVASC) 5 MG tablet Take 5 mg by mouth as directed.  Take 1/2 tablet daily    . aspirin 81 MG chewable tablet Chew 1 tablet by mouth daily.    Marland Kitchen atorvastatin (LIPITOR) 20 MG tablet Take 20 mg by mouth daily.    . Biotin 2500 MCG CAPS Take 1 capsule by mouth daily.    . cholecalciferol (VITAMIN D) 1000 units tablet Take 1,000 Units by mouth daily.    . Coenzyme Q10 (CO Q 10) 100 MG CAPS Take 1 capsule by mouth daily.    . Cyanocobalamin (B-12) 2500 MCG TABS Take 2,500 mcg by mouth daily.     . fluticasone (FLONASE) 50 MCG/ACT nasal spray Place 1 spray into both nostrils daily as needed for allergies or rhinitis.    . folic acid (FOLVITE) 400 MCG tablet Take 400 mcg by mouth daily.    . meloxicam (MOBIC) 7.5 MG tablet Take 7.5 mg by mouth daily as needed for pain.     . metFORMIN (GLUMETZA) 500 MG (MOD) 24 hr tablet Take 500 mg by mouth 4 (four) times daily.     . metoprolol (TOPROL-XL) 200 MG 24 hr tablet Take 200 mg by mouth daily.    Marland Kitchen spironolactone (ALDACTONE) 25 MG tablet Take 0.5 tablets (12.5  mg total) by mouth daily. 90 tablet 3   No current facility-administered medications for this visit.     Allergies:   Angiotensin i, human [angiotensin]; Flagyl [metronidazole]; Codeine; Nalbuphine; Ace inhibitors; Angiotensin receptor blockers; Other; Cottonseed oil; and Nickel   ROS:  Please see the history of present illness.   Otherwise, review of systems are positive for none.   All other systems are reviewed and negative.    PHYSICAL EXAM: VS:  BP 131/81   Pulse 67   Temp (!) 97.3 F (36.3 C)   Ht 5\' 4"  (1.626 m)   Wt 158 lb 3.2 oz (71.8 kg)   SpO2 98%   BMI 27.15 kg/m  , BMI Body mass index is 27.15 kg/m. GENERAL:  Well appearing NECK:  No jugular venous distention, waveform within normal limits, carotid upstroke brisk and symmetric, no bruits, no thyromegaly LUNGS:  Clear to auscultation bilaterally CHEST:  Unremarkable HEART:  PMI not displaced or sustained,S1 and S2 within normal limits, no S3, no S4, no clicks, no rubs, no  murmurs ABD:  Flat, positive bowel sounds normal in frequency in pitch, positive bruits, no rebound, no guarding, no midline pulsatile mass, no hepatomegaly, no splenomegaly EXT:  2 plus pulses throughout, no edema, no cyanosis no clubbing   EKG:  EKG is  ordered today. Sinus rhythm, rate 64, axis within normal limits, intervals within normal limits, poor anterior R wave progression. This is unchanged from previous EKGs.   Lipid Panel No results found for: CHOL, TRIG, HDL, CHOLHDL, VLDL, LDLCALC, LDLDIRECT    Wt Readings from Last 3 Encounters:  09/21/19 158 lb 3.2 oz (71.8 kg)  08/01/18 159 lb (72.1 kg)  04/18/18 159 lb (72.1 kg)      Other studies Reviewed: Additional studies/ records that were reviewed today include: None Review of the above records demonstrates:     ASSESSMENT AND PLAN:  AI:  This was mild on echo.  I think this is changed by exam.  No further imaging is indicated.   HTN:   Her blood pressure was not at target.  I reviewed the blood pressure diary.  She is in the high 130s to 140s consistently.  She has low potassium typically.  I did add spironolactone 12 and half milligrams daily.  She will keep a blood pressure diary.  I will check a basic metabolic profile about 10 days.  BRUIT: She has an abdominal bruit.  Abdominal ultrasound.  Current medicines are reviewed at length with the patient today.  The patient does not have concerns regarding medicines.  The following changes have been made:  As above  Labs/ tests ordered today include:      Disposition:   FU with me 12 months.   Signed, Minus Breeding, MD  09/21/2019 9:44 AM    [facility]

## 2019-09-21 ENCOUNTER — Encounter: Payer: Self-pay | Admitting: Cardiology

## 2019-09-21 ENCOUNTER — Other Ambulatory Visit: Payer: Self-pay

## 2019-09-21 ENCOUNTER — Ambulatory Visit (INDEPENDENT_AMBULATORY_CARE_PROVIDER_SITE_OTHER): Payer: Medicare Other | Admitting: Cardiology

## 2019-09-21 VITALS — BP 131/81 | HR 67 | Temp 97.3°F | Ht 64.0 in | Wt 158.2 lb

## 2019-09-21 DIAGNOSIS — I351 Nonrheumatic aortic (valve) insufficiency: Secondary | ICD-10-CM | POA: Diagnosis not present

## 2019-09-21 DIAGNOSIS — R0989 Other specified symptoms and signs involving the circulatory and respiratory systems: Secondary | ICD-10-CM

## 2019-09-21 DIAGNOSIS — I10 Essential (primary) hypertension: Secondary | ICD-10-CM | POA: Diagnosis not present

## 2019-09-21 DIAGNOSIS — Z23 Encounter for immunization: Secondary | ICD-10-CM

## 2019-09-21 MED ORDER — SPIRONOLACTONE 25 MG PO TABS: 12.5000 mg | ORAL_TABLET | Freq: Every day | ORAL | 3 refills | Status: DC

## 2019-09-21 NOTE — Patient Instructions (Signed)
Medication Instructions:  Start taking 1/2 tablet (12.5mg ) of spironolactone once daily.  If you need a refill on your cardiac medications before your next appointment, please call your pharmacy.   Lab work: BMET in 10 DAYS If you have labs (blood work) drawn today and your tests are completely normal, you will receive your results only by: MyChart Message (if you have MyChart) OR A paper copy in the mail If you have any lab test that is abnormal or we need to change your treatment, we will call you to review the results.  Testing/Procedures: Your physician has requested that you have an abdominal aorta duplex. During this test, an ultrasound is used to evaluate the aorta. Allow 30 minutes for this exam. Do not eat after midnight the day before and avoid carbonated beverages   Follow-Up: At [facility], you and your health needs are our priority.  As part of our continuing mission to provide you with exceptional heart care, we have created designated Provider Care Teams.  These Care Teams include your primary Cardiologist (physician) and Advanced Practice Providers (APPs -  Physician Assistants and Nurse Practitioners) who all work together to provide you with the care you need, when you need it. You will need a follow up appointment in 12 months.  Please call our office 2 months in advance to schedule this appointment.  You may see Dr. Antoine Poche or one of the following Advanced Practice Providers on your designated Care Team:   Theodore Demark, PA-C Joni Reining, DNP, ANP  Any Other Special Instructions Will Be Listed Below (If Applicable).        Preventing Influenza, Adult Influenza, more commonly known as "the flu," is a viral infection that mainly affects the respiratory tract. The respiratory tract includes structures that help you breathe, such as the lungs, nose, and throat. The flu causes many common cold symptoms, as well as a high fever and body aches. The flu spreads  easily from person to person (is contagious). The flu is most common from December through March. This is called flu season.You can catch the flu virus by:  Breathing in droplets from an infected person's cough or sneeze.  Touching something that was recently contaminated with the virus and then touching your mouth, nose, or eyes. What can I do to lower my risk?        You can decrease your risk of getting the flu by:  Getting a flu shot (influenza vaccination) every year. This is the best way to prevent the flu. A flu shot is recommended for everyone age 18 months and older. ? It is best to get a flu shot in the fall, as soon as it is available. Getting a flu shot during winter or spring instead is still a good idea. Flu season can last into early spring. ? Preventing the flu through vaccination requires getting a new flu shot every year. This is because the flu virus changes slightly (mutates) from one year to the next. Even if a flu shot does not completely protect you from all flu virus mutations, it can reduce the severity of your illness and prevent dangerous complications of the flu. ? If you are pregnant, you can and should get a flu shot. ? If you have had a reaction to the shot in the past or if you are allergic to eggs, check with your health care provider before getting a flu shot. ? Sometimes the vaccine is available as a nasal spray. In some  years, the nasal spray has not been as effective against the flu virus. Check with your health care provider if you have questions about this.  Practicing good health habits. This is especially important during flu season. ? Avoid contact with people who are sick with flu or cold symptoms. ? Wash your hands with soap and water often. If soap and water are not available, use alcohol-based hand sanitizer. ? Avoid touching your hands to your face, especially when you have not washed your hands recently. ? Use a disinfectant to clean surfaces at  home and at work that may be contaminated with the flu virus. ? Keep your body's disease-fighting system (immune system) in good shape by eating a healthy diet, drinking plenty of fluids, getting enough sleep, and exercising regularly. If you do get the flu, avoid spreading it to others by:  Staying home until your symptoms have been gone for at least one day.  Covering your mouth and nose when you cough or sneeze.  Avoiding close contact with others, especially babies and elderly people. Why are these changes important? Getting a flu shot and practicing good health habits protects you as well as other people. If you get the flu, your friends, family, and co-workers are also at risk of getting it, because it spreads so easily to others. Each year, about 2 out of every 10 people get the flu. Having the flu can lead to complications, such as pneumonia, ear infection, and sinus infection. The flu also can be deadly, especially for babies, people older than age 91, and people who have serious long-term diseases. How is this treated? Most people recover from the flu by resting at home and drinking plenty of fluids. However, a prescription antiviral medicine may reduce your flu symptoms and may make your flu go away sooner. This medicine must be started within a few days of getting flu symptoms. You can talk with your health care provider about whether you need an antiviral medicine. Antiviral medicine may be prescribed for people who are at risk for more serious flu symptoms. This includes people who:  Are older than age 18.  Are pregnant.  Have a condition that makes the flu worse or more dangerous. Where to find more information  Centers for Disease Control and Prevention: http://www.smith-bell.org/  [employer].ee: azureicus.com  American Academy of Family Physicians: familydoctor.org/familydoctor/en/kids/vaccines/preventing-the-flu.html Contact a health care provider  if:  You have influenza and you develop new symptoms.  You have: ? Chest pain. ? Diarrhea. ? A fever.  Your cough gets worse, or you produce more mucus. Summary  The best way to prevent the flu is to get a flu shot every year in the fall.  Even if you get the flu after you have received the yearly vaccine, your flu may be milder and go away sooner because of your flu shot.  If you get the flu, antiviral medicines that are started with a few days of symptoms may reduce your flu symptoms and may make your flu go away sooner.  You can also help prevent the flu by practicing good health habits. This information is not intended to replace advice given to you by your health care provider. Make sure you discuss any questions you have with your health care provider. Document Released: 12/18/2015 Document Revised: 11/15/2017 Document Reviewed: 08/11/2016 Elsevier Patient Education  2020 [employer].

## 2019-09-29 ENCOUNTER — Other Ambulatory Visit: Payer: Self-pay

## 2019-09-29 ENCOUNTER — Ambulatory Visit (HOSPITAL_COMMUNITY)
Admission: RE | Admit: 2019-09-29 | Discharge: 2019-09-29 | Disposition: A | Discharge status: Home / Self Care | Payer: Medicare Other | Source: Ambulatory Visit | Attending: Cardiovascular Disease | Admitting: Cardiovascular Disease

## 2019-09-29 DIAGNOSIS — R0989 Other specified symptoms and signs involving the circulatory and respiratory systems: Secondary | ICD-10-CM

## 2019-10-01 LAB — BASIC METABOLIC PANEL
BUN/Creatinine Ratio: 18 (ref 12–28)
BUN: 14 mg/dL (ref 8–27)
CO2: 25 mmol/L (ref 20–29)
Calcium: 9.9 mg/dL (ref 8.7–10.3)
Chloride: 103 mmol/L (ref 96–106)
Creatinine, Ser: 0.78 mg/dL (ref 0.57–1.00)
GFR calc Af Amer: 86 mL/min/{1.73_m2} (ref >59)
GFR calc non Af Amer: 75 mL/min/{1.73_m2} (ref >59)
Glucose: 99 mg/dL (ref 65–99)
Potassium: 4.8 mmol/L (ref 3.5–5.2)
Sodium: 141 mmol/L (ref 134–144)

## 2019-11-20 ENCOUNTER — Other Ambulatory Visit: Payer: Self-pay

## 2019-11-20 DIAGNOSIS — Z20822 Contact with and (suspected) exposure to covid-19: Secondary | ICD-10-CM

## 2019-11-23 LAB — NOVEL CORONAVIRUS, NAA: SARS-CoV-2, NAA: NOT DETECTED

## 2020-02-15 ENCOUNTER — Ambulatory Visit: Payer: Medicare Other | Attending: Internal Medicine

## 2020-02-15 DIAGNOSIS — Z23 Encounter for immunization: Secondary | ICD-10-CM

## 2020-02-15 NOTE — Progress Notes (Signed)
   Covid-19 Vaccination Clinic  Name:  Shelby Robinson    MRN: [id number] DOB: [birth year]  02/15/2020  Shelby Robinson was observed post Covid-19 immunization for 30 minutes based on pre-vaccination screening without incidence. She was provided with Vaccine Information Sheet and instruction to access the V-Safe system.   Shelby Robinson was instructed to call 911 with any severe reactions post vaccine: Marland Kitchen Difficulty breathing  . Swelling of your face and throat  . A fast heartbeat  . A bad rash all over your body  . Dizziness and weakness    Immunizations Administered    Name Date Dose VIS Date Route   Pfizer COVID-19 Vaccine 02/15/2020 10:09 AM 0.3 mL 11/27/2019 Intramuscular   Manufacturer: [employer], [employer]   Lot: [id number]   NDC: [address]-3

## 2020-03-15 ENCOUNTER — Ambulatory Visit: Payer: Medicare Other | Attending: Internal Medicine

## 2020-03-15 DIAGNOSIS — Z23 Encounter for immunization: Secondary | ICD-10-CM

## 2020-03-15 NOTE — Progress Notes (Signed)
   Covid-19 Vaccination Clinic  Name:  Evetta Renner    MRN: [id number] DOB: 1946/05/28  03/15/2020  Ms. Coburn was observed post Covid-19 immunization for 15 minutes without incident. She was provided with Vaccine Information Sheet and instruction to access the V-Safe system.   Ms. Barsamian was instructed to call 911 with any severe reactions post vaccine: Marland Kitchen Difficulty breathing  . Swelling of face and throat  . A fast heartbeat  . A bad rash all over body  . Dizziness and weakness   Immunizations Administered    Name Date Dose VIS Date Route   Pfizer COVID-19 Vaccine 03/15/2020 10:22 AM 0.3 mL 11/27/2019 Intramuscular   Manufacturer: [employer], [employer]   Lot: [id number]   NDC: [address]-0

## 2020-09-22 DIAGNOSIS — Z7189 Other specified counseling: Secondary | ICD-10-CM | POA: Insufficient documentation

## 2020-09-22 NOTE — Progress Notes (Signed)
Cardiology Office Note   Date:  09/23/2020   ID:  Akiyah Eppolito, DOB 01-17-1946, MRN [id number]  PCP:  [name], MD  Cardiologist:   Rollene Rotunda, MD    Chief Complaint  Patient presents with  . Loss of Consciousness      History of Present Illness: Lafawn Lenoir is a 74 y.o. female who presents for evaluation of valve disease. She had a reported history of mitral regurgitation and aortic insufficiency on echo prior to my first appt with her.  I ordered an echo in Nov 2017 which showed mild AI and NL LV EF.  She returns for follow up.  At the last visit her BP was elevated so I added low dose spironolactone.    Since I last saw her she fell leaving her house.  Its not clear whether she passed out.  She really cannot recall the event.  She had a little discomfort but did not seek medical care.  Ultimately she saw her primary care and they did a CT of her head but there were no acute findings.  She has not had any presyncope or syncope.  She does not feel her heart racing or skipping.  She does not have orthostatic symptoms.  She denies any chest pressure, neck or arm discomfort.  She has had no shortness of breath, PND or orthopnea.  She has had no weight gain.  Since I increased her medicines to include spironolactone she has had less lower extremity swelling.  Blood pressures have been well controlled.    Past Medical History:  Diagnosis Date  . B12 deficiency   . 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 Constipation   . Diabetes mellitus without complication (HCC)   . GERD (gastroesophageal reflux disease)   . Hypertension   . Hyperthyroidism   . Lumbar spondylosis   . Osteoarthritis of lumbar spine   . Synovitis of hip   . Tarlov cyst     Past Surgical History:  Procedure Laterality Date  . cataract    . ELBOW SURGERY Right   . HIP SURGERY     Bilateral THR  . TOTAL HIP ARTHROPLASTY    . VAGINAL HYSTERECTOMY       Current  Outpatient Medications  Medication Sig Dispense Refill  . amLODipine (NORVASC) 5 MG tablet Take 5 mg by mouth as directed. Take 1/2 tablet daily    . aspirin 81 MG chewable tablet Chew 1 tablet by mouth daily.    Marland Kitchen atorvastatin (LIPITOR) 20 MG tablet Take 20 mg by mouth daily.    . Biotin 2500 MCG CAPS Take 1 capsule by mouth daily.    . cholecalciferol (VITAMIN D) 1000 units tablet Take 1,000 Units by mouth daily.    . Coenzyme Q10 (CO Q 10) 100 MG CAPS Take 1 capsule by mouth daily.    . Cyanocobalamin (B-12) 2500 MCG TABS Take 2,500 mcg by mouth daily.     . fluticasone (FLONASE) 50 MCG/ACT nasal spray Place 1 spray into both nostrils daily as needed for allergies or rhinitis.    . folic acid (FOLVITE) 400 MCG tablet Take 400 mcg by mouth daily.    . meloxicam (MOBIC) 7.5 MG tablet Take 7.5 mg by mouth daily as needed for pain.     . metFORMIN (GLUMETZA) 500 MG (MOD) 24 hr tablet Take 500 mg by mouth 4 (four) times daily.     . metoprolol (TOPROL-XL) 200  MG 24 hr tablet Take 200 mg by mouth daily.    Marland Kitchen spironolactone (ALDACTONE) 25 MG tablet Take 0.5 tablets (12.5 mg total) by mouth daily. 90 tablet 3   No current facility-administered medications for this visit.    Allergies:   Angiotensin i, human [angiotensin]; Flagyl [metronidazole]; Codeine; Nalbuphine; Ace inhibitors; Angiotensin receptor blockers; Other; Cottonseed oil; and Nickel   ROS:  Please see the history of present illness.   Otherwise, review of systems are positive for none.   All other systems are reviewed and negative.    PHYSICAL EXAM: VS:  BP 124/76   Pulse 61   Temp (!) 95.7 F (35.4 C)   Ht 5\' 4"  (1.626 m)   Wt 147 lb 6.4 oz (66.9 kg)   SpO2 99%   BMI 25.30 kg/m  , BMI Body mass index is 25.3 kg/m. GENERAL:  Well appearing NECK:  No jugular venous distention, waveform within normal limits, carotid upstroke brisk and symmetric, no bruits, no thyromegaly LUNGS:  Clear to auscultation bilaterally CHEST:   Unremarkable HEART:  PMI not displaced or sustained,S1 and S2 within normal limits, no S3, no S4, no clicks, no rubs, no murmurs ABD:  Flat, positive bowel sounds normal in frequency in pitch, positive bruits, no rebound, no guarding, no midline pulsatile mass, no hepatomegaly, no splenomegaly EXT:  2 plus pulses throughout, no edema, no cyanosis no clubbing  EKG:  EKG is  ordered today. Sinus rhythm, rate 61, axis within normal limits, intervals within normal limits, poor anterior R wave progression. This is unchanged from previous EKGs.   Lipid Panel No results found for: CHOL, TRIG, HDL, CHOLHDL, VLDL, LDLCALC, LDLDIRECT    Wt Readings from Last 3 Encounters:  09/23/20 147 lb 6.4 oz (66.9 kg)  09/21/19 158 lb 3.2 oz (71.8 kg)  08/01/18 159 lb (72.1 kg)      Other studies Reviewed: Additional studies/ records that were reviewed today include: Labs Review of the above records demonstrates: See elsewhere   ASSESSMENT AND PLAN:  AI:  This was mild on echo.  There is no change on exam so no further imaging is indicated.  I will listen to this annually.  HTN:   Blood pressure is well controlled.  No change in therapy.   QUESTIONABLE SYNCOPE: There is no clear syncope.  She has no findings were symptoms that would prompt a useful investigation.  Therefore, no change in therapy.  DYSLIPIDEMIA: Earlier this year LDL was 59 with an HDL of 55.  No change in therapy.  COVID EDUCATION: She has been vaccinated.  Current medicines are reviewed at length with the patient today.  The patient does not have concerns regarding medicines.  The following changes have been made: None  Labs/ tests ordered today include:   None   Disposition:   FU with me 12 months.   Signed, 60, MD  09/23/2020 9:36 AM    [facility]

## 2020-09-23 ENCOUNTER — Ambulatory Visit (INDEPENDENT_AMBULATORY_CARE_PROVIDER_SITE_OTHER): Payer: Medicare Other | Admitting: Cardiology

## 2020-09-23 ENCOUNTER — Other Ambulatory Visit: Payer: Self-pay

## 2020-09-23 ENCOUNTER — Encounter: Payer: Self-pay | Admitting: Cardiology

## 2020-09-23 VITALS — BP 124/76 | HR 61 | Temp 95.7°F | Ht 64.0 in | Wt 147.4 lb

## 2020-09-23 DIAGNOSIS — Z7189 Other specified counseling: Secondary | ICD-10-CM | POA: Diagnosis not present

## 2020-09-23 DIAGNOSIS — I351 Nonrheumatic aortic (valve) insufficiency: Secondary | ICD-10-CM

## 2020-09-23 DIAGNOSIS — I10 Essential (primary) hypertension: Secondary | ICD-10-CM

## 2020-09-23 NOTE — Patient Instructions (Signed)
Medication Instructions:  Your physician recommends that you continue on your current medications as directed. Please refer to the Current Medication list given to you today.  *If you need a refill on your cardiac medications before your next appointment, please call your pharmacy*   Follow-Up: At [facility], you and your health needs are our priority.  As part of our continuing mission to provide you with exceptional heart care, we have created designated Provider Care Teams.  These Care Teams include your primary Cardiologist (physician) and Advanced Practice Providers (APPs -  Physician Assistants and Nurse Practitioners) who all work together to provide you with the care you need, when you need it.  We recommend signing up for the patient portal called "MyChart".  Sign up information is provided on this After Visit Summary.  MyChart is used to connect with patients for Virtual Visits (Telemedicine).  Patients are able to view lab/test results, encounter notes, upcoming appointments, etc.  Non-urgent messages can be sent to your provider as well.   To learn more about what you can do with MyChart, go to [email or web address].    Your next appointment:   12 month(s)  The format for your next appointment:   In Person  Provider:   You may see Dr. Hochrein or one of the following Advanced Practice Providers on your designated Care Team:    Rhonda Barrett, PA-C  Kathryn Lawrence, DNP, ANP    Other Instructions    

## 2020-12-10 ENCOUNTER — Other Ambulatory Visit: Payer: Self-pay | Admitting: Cardiology

## 2021-03-24 ENCOUNTER — Encounter (INDEPENDENT_AMBULATORY_CARE_PROVIDER_SITE_OTHER): Payer: Self-pay

## 2021-03-29 ENCOUNTER — Encounter: Payer: Self-pay | Admitting: Neurology

## 2021-06-21 ENCOUNTER — Encounter: Payer: Self-pay | Admitting: Neurology

## 2021-06-21 ENCOUNTER — Ambulatory Visit (INDEPENDENT_AMBULATORY_CARE_PROVIDER_SITE_OTHER): Payer: Medicare Other | Admitting: Neurology

## 2021-06-21 ENCOUNTER — Other Ambulatory Visit: Payer: Self-pay

## 2021-06-21 ENCOUNTER — Other Ambulatory Visit (INDEPENDENT_AMBULATORY_CARE_PROVIDER_SITE_OTHER): Payer: Medicare Other

## 2021-06-21 VITALS — BP 133/80 | HR 71 | Ht 64.0 in | Wt 141.6 lb

## 2021-06-21 DIAGNOSIS — R413 Other amnesia: Secondary | ICD-10-CM

## 2021-06-21 LAB — VITAMIN B12: Vitamin B-12: 829 pg/mL (ref 211–911)

## 2021-06-21 LAB — TSH: TSH: 0.81 u[IU]/mL (ref 0.35–5.50)

## 2021-06-21 NOTE — Progress Notes (Addendum)
Assessment/Plan:   Shelby Robinson is a 75 y.o. year old female with risk factors including  age, hypertension, hyperlipidemia, DM2, hyperthyroidism, B12 deficiency,Vit D deficiency, migraines, and a history of recurrent TGA seen today for evaluation of memory loss. MoCA today is 18/30  with deficiencies in visuospatial/executive, memory, attention, language, delayed recall  1/5; orientation 5/6. According to literature, TGA may increase long-term risk of dementia. EEG in 2019 normal.  Prior imaging was negative for acute findings.  Recommendations    MRI of the brain to assess for underlying structural abnormality and assess vascular load Neurocognitive testing to further evaluate cognitive concerns, including potential contribution from sleep, stress, anxiety depression Check B12, TSH Discussed safety both in and out of the home.  Discussed the importance of regular daily schedule with inclusion of crossword puzzles to maintain brain function.  Continue to monitor mood with PCP.  Stay active at least 30 minutes at least 3 times a week.  Naps should be scheduled and should be no longer than 60 minutes and should not occur after 2 PM.  Mediterranean diet is recommended  Folllow up once results above are available     Subjective:     The patient is a 75 y.o. year old female  initially seen on 04/18/18  at [facility] by Dr. Lucia Gaskins after experiencing acute transient global amnesia  (TGA) on 02/04/18 during an ED presentation for a hypertensive event and acute, severe R hip pain while walking, without jerking or spasm or other seizure activity.  Per chart, "Blood pressure was sky high and then everything got fuzzy, not knowing why was there, beginning to repeat things". Patient does not recall most of the time in the hospital, and during that stay she had hallucinations . Since then, most of her memory was not recovered, being forgetful, not remembering appointments, and repeating same questions and  stories. Neurocognitive testing was recommended by she did not follow up because she had to care for her ill mother. She had a second opinion at [facility]  yielding same results after extensive lab workup.  The patient is seen in neurologic consultation at the request of Shelby Chessman, MD for the evaluation of memory. She is here alone with no one to supplement the history. She lives with her husband Shelby Robinson.  The patient states that since her last evaluation, her memory is worse, it is hard for her to remember names. She continues to ask the same questions at home, and repeating the same stories.  In addition she admits that she has increasingly vivid dreams, she is aware that she is acting out on them, but denies any sleepwalking.  She denies any history of sleep apnea and falls asleep easily but then wakes up with water resolving thoughts.  She denies leaving objects in unusual places.  She also has begun forgetting to take her medications, and the finances were taken over by her husband, because she forgets to pay the bills.  When driving, she has found herself feeling lost at times, and this was witnessed by one of her friends. She has been crying more often, especially when thinking about her memory loss.  She is independent of bathing and dressing.  Her appetite is decreased, she does not cook often.  Denies leaving the stove on.  She finds herself more depressed although she never been diagnosed with depression.  She denies irritability.  She feels weaker, having fallen several times.  Since last year, she felt 4 times  hitting "the head in the same spot on the right ", described as going backwards, without any leg weakness, or seizure activity.  She denies loss of consciousness, nausea or vomiting.  She just knows "I am going to fall ".  She denies any acute vision changes.  Recently, she has been seen by an ophthalmologist, for a chronic blurriness in the right eye described as "glass to close the  eye, and resolves within 15 minutes, makes the whole area blurry, and the eyeball hurts "she reports that she has a history of "bleeding the optic nerve and they are watching it ".  Apparently she had a diagnosis of no headache migraine.  She denies double vision.  She denies dizziness, focal numbness or tingling, unilateral weakness or tremors.  She has urinary incontinence, "at times I wake up wet ".  She has intermittent constipation and diarrhea.  Denies anosmia.  Denies a history of OSA, alcohol or tobacco.  Family history is remarkable for dementia in her mother at age 77, she also has a brother with CADASIL in his 9250s( Cerebral Autosomal Dominant Arteriopathy with Subcortical Infarcts and Leukoencephalopathy) .  EEG 01/2018 was normal with no focal hemispheric or lateralizing features.  02/05/18 MRI brain not suggestive of CADASIL, no stroke No acute intracranial abnormality and normal for age noncontrast.Small nonspecific 15 mm soft tissue nodule along the anterior left parotid space. 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    PET scan 05/06/18 No decreased relative cortical metabolism to suggest frontotemporal dementia pathology or Alzheimer's type pathology.   CT of the head without contrast in April 2021 showed no acute intracranial findings, chronic microvascular ischemic changes and cerebral volume loss  Repeat CT of the head without contrast taken for frequent falls in December 2021 showed no acute intracranial abnormality.  Allergies  Allergen Reactions   Angiotensin I, Human [Angiotensin] Swelling    Suspect angioedema   Flagyl [Metronidazole] Anaphylaxis and Hives   Codeine Nausea Only, Palpitations and Other (See Comments)    headache   Nalbuphine Other (See Comments)     Mental Status Changes    Ace Inhibitors    Angiotensin Ii Swelling   Angiotensin Receptor Blockers    Chicken-Peas-Carrots [Alitraq]    Other     To BP meds-unable to remember; tree nuts     Tree Extract     Tree nuts   Cottonseed Oil Hives   Nickel Hives    Current Outpatient Medications  Medication Instructions   5-Hydroxytryptophan (5-HTP) 100 MG CAPS Oral   atorvastatin (LIPITOR) 10 mg, Oral, Daily   Calcium Carbonate Antacid (CHOOZ PO) Oral, As needed   cetirizine (ZYRTEC) 10 mg, Oral, As needed   Coenzyme Q10 30 mg, Oral, 3 times daily   fluticasone (FLONASE) 50 MCG/ACT nasal spray 1 spray, Each Nare, Daily PRN   folic acid (FOLVITE) 400 mcg, Oral, Daily   Magnesium 400 mg, Oral, Daily   Melatonin 10 mg, Oral, As needed   meloxicam (MOBIC) 7.5 MG tablet 1 tablet, Oral, Daily   metFORMIN (GLUCOPHAGE) 1,000 mg, Oral, 2 times daily   metoprolol (TOPROL-XL) 200 mg, Oral, Daily   montelukast (SINGULAIR) 10 MG tablet montelukast 10 mg tablet   NON FORMULARY Biotin rinse as needed for dry mouth   Omega-3 Fatty Acids (FISH OIL OMEGA-3 PO) 360 mg, Oral   polyethylene glycol (MIRALAX / GLYCOLAX) 17 g, Oral, Daily   PRESCRIPTION MEDICATION Mega benfotiamine 250mg  once daily helps blood sugar  spironolactone (ALDACTONE) 25 MG tablet TAKE 1/2 TABLET BY MOUTH DAILY   vitamin C (ASCORBIC ACID) 500 mg, Oral, Daily   VITAMIN D PO Oral     VITALS:   Vitals:   06/21/21 1012  BP: 133/80  Pulse: 71  SpO2: 97%  Weight: 141 lb 9.6 oz (64.2 kg)  Height: 5\' 4"  (1.626 m)   No flowsheet data found.  HEENT:  Normocephalic, atraumatic. The mucous membranes are moist. The superficial temporal arteries are without ropiness or tenderness. Cardiovascular: Regular rate and rhythm. Lungs: Clear to auscultation bilaterally. Neck: There are no carotid bruits noted bilaterally.  NEUROLOGICAL:  Orientation:   Montreal Cognitive Assessment  06/21/2021  Visuospatial/ Executive (0/5) 3  Naming (0/3) 3  Attention: Read list of digits (0/2) 1  Attention: Read list of letters (0/1) 0  Attention: Serial 7 subtraction starting at 100 (0/3) 2  Language: Repeat phrase (0/2) 0  Language  : Fluency (0/1) 1  Abstraction (0/2) 2  Delayed Recall (0/5) 1  Orientation (0/6) 5  Total 18  Adjusted Score (based on education) 18   Alert and oriented to person, place and time. No aphasia or dysarthria. Fund of knowledge is appropriate. Recent memory impaired and remote memory intact.  Attention and concentration are impaired.  Able to name objects and. Repeat phrases 1/2 . Delayed recall  1/5 Cranial nerves: There is good facial symmetry. Extraocular muscles are intact and visual fields are full to confrontational testing. Speech is fluent and clear. Soft palate rises symmetrically and there is no tongue deviation. Hearing is intact to conversational tone. Tone: Tone is good throughout. Sensation: Sensation is intact to light touch and pinprick throughout. Vibration is intact at the bilateral big toe.There is no extinction with double simultaneous stimulation. There is no sensory dermatomal level identified. Coordination: The patient has no difficulty with RAM's or FNF bilaterally. Normal finger to nose  Motor: Strength is 5/5 in the bilateral upper and lower extremities. There is no pronator drift. There are no fasciculations noted. DTR's: Deep tendon reflexes are 2/4 at the bilateral biceps, triceps, brachioradialis, patella and achilles.  Plantar responses are downgoing bilaterally. Gait and Station: The patient is able to ambulate without difficulty.The patient is able to heel toe walk without any difficulty.The patient is able to ambulate in a tandem fashion. The patient is able to stand in the Romberg position.   CBC Latest Ref Rng & Units 02/05/2018 02/04/2018 02/03/2018  WBC 4.0 - 10.5 K/uL 7.3 7.5 9.5  Hemoglobin 12.0 - 15.0 g/dL 02/05/2018 08.6 76.1  Hematocrit 36.0 - 46.0 % 41.2 40.8 42.6  Platelets 150 - 400 K/uL 187 196 221     CMP Latest Ref Rng & Units 10/01/2019 02/05/2018 02/04/2018  Glucose 65 - 99 mg/dL 99 02/06/2018) 932(I)  BUN 8 - 27 mg/dL 14 15 11   Creatinine 0.57 - 1.00 mg/dL  712(W 5.80  Sodium 134 - 144 mmol/L 141 143 142  Potassium 3.5 - 5.2 mmol/L 4.8 3.5 3.7  Chloride 96 - 106 mmol/L 103 110 106  CO2 20 - 29 mmol/L 25 21(L) 22  Calcium 8.7 - 10.3 mg/dL 9.9 8.9 9.2  Total Protein 6.5 - 8.1 g/dL - - -  Total Bilirubin 0.3 - 1.2 mg/dL - - -  Alkaline Phos 38 - 126 U/L - - -  AST 15 - 41 U/L - - -  ALT 14 - 54 U/L - - -      Thank you for allowing 9.98 the  opportunity to participate in the care of this nice patient. Please do not hesitate to contact us for any questions or concerns.    Cc:  Shelby Chessman, MD  Marlowe Kays 06/21/2021 2:49 PM

## 2021-06-21 NOTE — Patient Instructions (Addendum)
It was a pleasure to see you today at our office.   Recommendations:  Neurocognitive evaluation at our office MRI of the brain, the office will call you to arrange you appointment Check B12 and TSH at the lab Follow up once the results of the above are available   There are some activities which have therapeutic value and can be useful in keeping you cognitively stimulated. You can try this website: https://www.barrow[facility]uro.org/get-to-know-barrow/centers-programs/neurorehabilitation-center/neuro-rehab-apps-and-games/ which has options, categorized by level of difficulty.   RECOMMENDATIONS FOR ALL PATIENTS WITH MEMORY PROBLEMS: 1. Continue to exercise (Recommend 30 minutes of walking everyday, or 3 hours every week) 2. Increase social interactions - continue going to [employer] and enjoy social gatherings with friends and family 3. Eat healthy, avoid fried foods and eat more fruits and vegetables 4. Maintain adequate blood pressure, blood sugar, and blood cholesterol level. Reducing the risk of stroke and cardiovascular disease also helps promoting better memory. 5. Avoid stressful situations. Live a simple life and avoid aggravations. Organize your time and prepare for the next day in anticipation. 6. Sleep well, avoid any interruptions of sleep and avoid any distractions in the bedroom that may interfere with adequate sleep quality 7. Avoid sugar, avoid sweets as there is a strong link between excessive sugar intake, diabetes, and cognitive impairment We discussed the Mediterranean diet, which has been shown to help patients reduce the risk of progressive memory disorders and reduces cardiovascular risk. This includes eating fish, eat fruits and green leafy vegetables, nuts like almonds and hazelnuts, walnuts, and also use olive oil. Avoid fast foods and fried foods as much as possible. Avoid sweets and sugar as sugar use has been linked to worsening of memory function.  There is always a concern  of gradual progression of memory problems. If this is the case, then we may need to adjust level of care according to patient needs. Support, both to the patient and caregiver, should then be put into place.      You have been referred for a neuropsychological evaluation (i.e., evaluation of memory and thinking abilities). Please bring someone with you to this appointment if possible, as it is helpful for the doctor to hear from both you and another adult who knows you well. Please bring eyeglasses and hearing aids if you wear them.    The evaluation will take approximately 3 hours and has two parts:   The first part is a clinical interview with the neuropsychologist (Dr. Milbert Coulter or Dr. Roseanne Reno). During the interview, the neuropsychologist will speak with you and the individual you brought to the appointment.    The second part of the evaluation is testing with the doctor's technician Annabelle Harman or Selena Batten). During the testing, the technician will ask you to remember different types of material, solve problems, and answer some questionnaires. Your family member will not be present for this portion of the evaluation.   Please note: We must reserve several hours of the neuropsychologist's time and the psychometrician's time for your evaluation appointment. As such, there is a No-Show fee of $100. If you are unable to attend any of your appointments, please contact our office as soon as possible to reschedule.    FALL PRECAUTIONS: Be cautious when walking. Scan the area for obstacles that may increase the risk of trips and falls. When getting up in the mornings, sit up at the edge of the bed for a few minutes before getting out of bed. Consider elevating the bed at the head end to avoid  drop of blood pressure when getting up. Walk always in a well-lit room (use night lights in the walls). Avoid area rugs or power cords from appliances in the middle of the walkways. Use a walker or a cane if necessary and consider  physical therapy for balance exercise. Get your eyesight checked regularly.  FINANCIAL OVERSIGHT: Supervision, especially oversight when making financial decisions or transactions is also recommended.  HOME SAFETY: Consider the safety of the kitchen when operating appliances like stoves, microwave oven, and blender. Consider having supervision and share cooking responsibilities until no longer able to participate in those. Accidents with firearms and other hazards in the house should be identified and addressed as well.   ABILITY TO BE LEFT ALONE: If patient is unable to contact 911 operator, consider using LifeLine, or when the need is there, arrange for someone to stay with patients. Smoking is a fire hazard, consider supervision or cessation. Risk of wandering should be assessed by caregiver and if detected at any point, supervision and safe proof recommendations should be instituted.  MEDICATION SUPERVISION: Inability to self-administer medication needs to be constantly addressed. Implement a mechanism to ensure safe administration of the medications.   DRIVING: Regarding driving, in patients with progressive memory problems, driving will be impaired. We advise to have someone else do the driving if trouble finding directions or if minor accidents are reported. Independent driving assessment is available to determine safety of driving.   If you are interested in the driving assessment, you can contact the following:  [facility] [employer] [facility] in [place] [phone]  [facility] [phone]  [facility] [phone] or [phone]    Mediterranean Diet A Mediterranean diet refers to food and lifestyle choices that are based on the traditions of countries located on the [employer]. This way of eating has been shown to help prevent certain conditions and improve outcomes for people who have chronic diseases, like  kidney disease and heart disease. What are tips for following this plan? Lifestyle  Cook and eat meals together with your family, when possible. Drink enough fluid to keep your urine clear or pale yellow. Be physically active every day. This includes: Aerobic exercise like running or swimming. Leisure activities like gardening, walking, or housework. Get 7-8 hours of sleep each night. If recommended by your health care provider, drink red wine in moderation. This means 1 glass a day for nonpregnant women and 2 glasses a day for men. A glass of wine equals 5 oz (150 mL). Reading food labels  Check the serving size of packaged foods. For foods such as rice and pasta, the serving size refers to the amount of cooked product, not dry. Check the total fat in packaged foods. Avoid foods that have saturated fat or trans fats. Check the ingredients list for added sugars, such as corn syrup. Shopping  At the grocery store, buy most of your food from the areas near the walls of the store. This includes: Fresh fruits and vegetables (produce). Grains, beans, nuts, and seeds. Some of these may be available in unpackaged forms or large amounts (in bulk). Fresh seafood. Poultry and eggs. Low-fat dairy products. Buy whole ingredients instead of prepackaged foods. Buy fresh fruits and vegetables in-season from local farmers markets. Buy frozen fruits and vegetables in resealable bags. If you do not have access to quality fresh seafood, buy precooked frozen shrimp or canned fish, such as tuna, salmon, or sardines. Buy small amounts of raw or cooked  vegetables, salads, or olives from the deli or salad bar at your store. Stock your pantry so you always have certain foods on hand, such as olive oil, canned tuna, canned tomatoes, rice, pasta, and beans. Cooking  Cook foods with extra-virgin olive oil instead of using butter or other vegetable oils. Have meat as a side dish, and have vegetables or grains as  your main dish. This means having meat in small portions or adding small amounts of meat to foods like pasta or stew. Use beans or vegetables instead of meat in common dishes like chili or lasagna. Experiment with different cooking methods. Try roasting or broiling vegetables instead of steaming or sauteing them. Add frozen vegetables to soups, stews, pasta, or rice. Add nuts or seeds for added healthy fat at each meal. You can add these to yogurt, salads, or vegetable dishes. Marinate fish or vegetables using olive oil, lemon juice, garlic, and fresh herbs. Meal planning  Plan to eat 1 vegetarian meal one day each week. Try to work up to 2 vegetarian meals, if possible. Eat seafood 2 or more times a week. Have healthy snacks readily available, such as: Vegetable sticks with hummus. Greek yogurt. Fruit and nut trail mix. Eat balanced meals throughout the week. This includes: Fruit: 2-3 servings a day Vegetables: 4-5 servings a day Low-fat dairy: 2 servings a day Fish, poultry, or lean meat: 1 serving a day Beans and legumes: 2 or more servings a week Nuts and seeds: 1-2 servings a day Whole grains: 6-8 servings a day Extra-virgin olive oil: 3-4 servings a day Limit red meat and sweets to only a few servings a month What are my food choices? Mediterranean diet Recommended Grains: Whole-grain pasta. Brown rice. Bulgar wheat. Polenta. Couscous. Whole-wheat bread. Orpah Cobb. Vegetables: Artichokes. Beets. Broccoli. Cabbage. Carrots. Eggplant. Green beans. Chard. Kale. Spinach. Onions. Leeks. Peas. Squash. Tomatoes. Peppers. Radishes. Fruits: Apples. Apricots. Avocado. Berries. Bananas. Cherries. Dates. Figs. Grapes. Lemons. Melon. Oranges. Peaches. Plums. Pomegranate. Meats and other protein foods: Beans. Almonds. Sunflower seeds. Pine nuts. Peanuts. Cod. Salmon. Scallops. Shrimp. Tuna. Tilapia. Clams. Oysters. Eggs. Dairy: Low-fat milk. Cheese. Greek yogurt. Beverages: Water. Red  wine. Herbal tea. Fats and oils: Extra virgin olive oil. Avocado oil. Grape seed oil. Sweets and desserts: [place] yogurt with honey. Baked apples. Poached pears. Trail mix. Seasoning and other foods: Basil. Cilantro. Coriander. Cumin. Mint. Parsley. Sage. Rosemary. Tarragon. Garlic. Oregano. Thyme. Pepper. Balsalmic vinegar. Tahini. Hummus. Tomato sauce. Olives. Mushrooms. Limit these Grains: Prepackaged pasta or rice dishes. Prepackaged cereal with added sugar. Vegetables: Deep fried potatoes (french fries). Fruits: Fruit canned in syrup. Meats and other protein foods: Beef. Pork. Lamb. Poultry with skin. Hot dogs. Tomasa Blase. Dairy: Ice cream. Sour cream. Whole milk. Beverages: Juice. Sugar-sweetened soft drinks. Beer. Liquor and spirits. Fats and oils: Butter. Canola oil. Vegetable oil. Beef fat (tallow). Lard. Sweets and desserts: Cookies. Cakes. Pies. Candy. Seasoning and other foods: Mayonnaise. Premade sauces and marinades. The items listed may not be a complete list. Talk with your dietitian about what dietary choices are right for you. Summary The Mediterranean diet includes both food and lifestyle choices. Eat a variety of fresh fruits and vegetables, beans, nuts, seeds, and whole grains. Limit the amount of red meat and sweets that you eat. Talk with your health care provider about whether it is safe for you to drink red wine in moderation. This means 1 glass a day for nonpregnant women and 2 glasses a day for men. A glass of wine  equals 5 oz (150 mL). This information is not intended to replace advice given to you by your health care provider. Make sure you discuss any questions you have with your health care provider. Document Released: 07/26/2016 Document Revised: 08/28/2016 Document Reviewed: 07/26/2016 Elsevier Interactive Patient Education  2017 [employer].

## 2021-07-09 ENCOUNTER — Encounter: Payer: Self-pay | Admitting: Neurology

## 2021-07-09 NOTE — Progress Notes (Signed)
Patient was seen, evaluated, and treatment plan was discussed with the Advanced Practice Provider. MOCA score today 18/30. She has had an extensive workup in the past which I personally reviewed. MRI brain in 01/2018 no acute changes, no findings that are typically seen with CADASIL. EEG normal. PET scan in 04/2018 did not show any areas of cortical hypometabolism that would suggest frontotemporal or Alzheimer's type pathology. She reports continued cognitive decline. MRI brain and Neuropsychological evaluation will be helpful to further evaluate cognitive concerns. I have also reviewed the orders written for this patient which were under my direction. I agree with the findings and the plan of care as documented by the Advanced Practice Provider.

## 2021-07-15 ENCOUNTER — Ambulatory Visit
Admission: RE | Admit: 2021-07-15 | Discharge: 2021-07-15 | Disposition: A | Discharge status: Home / Self Care | Payer: Medicare Other | Source: Ambulatory Visit | Attending: Neurology | Admitting: Neurology

## 2021-07-15 ENCOUNTER — Other Ambulatory Visit: Payer: Self-pay

## 2021-07-15 DIAGNOSIS — R413 Other amnesia: Secondary | ICD-10-CM

## 2021-07-15 MED ORDER — GADOBENATE DIMEGLUMINE 529 MG/ML IV SOLN
13.0000 mL | Freq: Once | INTRAVENOUS | Status: AC | PRN
  Administered 2021-07-15: 13 mL via INTRAVENOUS

## 2021-07-20 ENCOUNTER — Telehealth: Payer: Self-pay

## 2021-07-20 NOTE — Telephone Encounter (Signed)
Pt called and informed that brain MRI looked fine, no evidence of stroke, tumor, or bleed. It showedage-related changes

## 2021-07-20 NOTE — Telephone Encounter (Signed)
-----   Message from Van Clines, MD sent at 07/18/2021 11:36 AM EDT ----- Pls let patient know brain MRI looked fine, no evidence of stroke, tumor, or bleed. It showed age-related changes, thanks

## 2021-09-13 ENCOUNTER — Ambulatory Visit (INDEPENDENT_AMBULATORY_CARE_PROVIDER_SITE_OTHER): Payer: Medicare Other | Admitting: Counselor

## 2021-09-13 ENCOUNTER — Other Ambulatory Visit: Payer: Self-pay

## 2021-09-13 ENCOUNTER — Encounter: Payer: Self-pay | Admitting: Counselor

## 2021-09-13 ENCOUNTER — Ambulatory Visit: Payer: Medicare Other | Admitting: Psychology

## 2021-09-13 DIAGNOSIS — G4719 Other hypersomnia: Secondary | ICD-10-CM

## 2021-09-13 DIAGNOSIS — R419 Unspecified symptoms and signs involving cognitive functions and awareness: Secondary | ICD-10-CM | POA: Diagnosis not present

## 2021-09-13 DIAGNOSIS — F09 Unspecified mental disorder due to known physiological condition: Secondary | ICD-10-CM

## 2021-09-13 DIAGNOSIS — R413 Other amnesia: Secondary | ICD-10-CM
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
## 2021-09-13 NOTE — Progress Notes (Signed)
   Psychometrist Note   Cognitive testing was administered to Shelby Robinson by Shelby Robinson, B.S. (Technician) under the supervision of Shelby Robinson, Psy.D., ABN. Shelby Robinson was able to tolerate all test procedures. Shelby Robinson met with the patient as needed to manage any emotional reactions to the testing procedures. Rest breaks were offered.    The battery of tests administered was selected by Shelby Robinson with consideration to the patient's current level of functioning, the nature of her symptoms, emotional and behavioral responses during the interview, level of literacy, observed level of motivation/effort, and the nature of the referral question. This battery was communicated to the psychometrist. Communication between Shelby Robinson and the psychometrist was ongoing throughout the evaluation and Shelby Robinson was immediately accessible at all times. Shelby Robinson provided supervision to the technician on the date of this service, to the extent necessary to assure the quality of all services provided.    Shelby Robinson will return in approximately one week for an interactive feedback session with Shelby Robinson, at which time test performance, clinical impressions, and treatment recommendations will be reviewed in detail. The patient understands she can contact our office should she require our assistance before this time.   A total of 125 minutes of billable time were spent with Shelby Robinson by the technician, including test administration and scoring time. Billing for these services is reflected in Shelby Robinson note.   This note reflects time spent with the psychometrician and does not include test scores, clinical history, or any interpretations made by Shelby Robinson. The full report will follow in a separate note.

## 2021-09-14 NOTE — Progress Notes (Signed)
NEUROPSYCHOLOGICAL TEST SCORES [facility]  Patient Name: Shelby Robinson MRN: [id number] Date of Birth: 11-Oct-1946 Age: 75 y.o. Education: 12 years  Measurement properties of test scores: IQ, Index, and Standard Scores (SS): Mean = 100; Standard Deviation = 15 Scaled Scores (Ss): Mean = 10; Standard Deviation = 3 Z scores (Z): Mean = 0; Standard Deviation = 1 T scores (T); Mean = 50; Standard Deviation = 10  TEST SCORES:    Note: This summary of test scores accompanies the interpretive report and should not be interpreted by unqualified individuals or in isolation without reference to the report. Test scores are relative to age, gender, and educational history as available and appropriate.   Performance Validity        "A" Random Letter Test Raw  Descriptor      Errors 0 Within Expectation  The Dot Counting Test: 12 Within Expectation      Embedded Measures: Raw  Descriptor      NAB Effort Index 3 Below Expectation      Mental Status Screening     Total Score Descriptor  MMSE 25 MCI      Expected Functioning        Wide Range Achievement Test: Standard/Scaled Score Percentile      Word Reading 99 47      Reynolds Intellectual Screening Test Standard/T-score Percentile      Guess What 48 42      Odd Item Out 52 58  RIST Index 100 50      Attention/Processing Speed        Neuropsychological Assessment Battery (Attention Module, Form 1): Scaled/T-score Percentile      Digits Forward 53 62      Digits Backwards 43 25      Dots 65 93      Driving Scenes 45 31      Repeatable Battery for the Assessment of Neuropsychological Status (Form A): Standard Score Percentile     Coding 9 37      Language        Neuropsychological Assessment Battery (Language Module, Form 1): T-score Percentile      Naming   (30) 60 84      Verbal Fluency:  T Score Percentile      Controlled Oral Word Association (F-A-S) 53 62      Semantic Fluency (Animals) 36 8      Memory:         Neuropsychological Assessment Battery (Memory Module, Form 1): T-score/Standard Score Percentile      List Learning           List A Immediate Recall   (5 , 8 , 8) 55 69         List B Immediate Recall   (6) 65 93         List A Short Delayed Recall   (1) 31 3         List A Long Delayed Recall   (4) 44 27         List A Percent Retention   (400 %) --- >99         List A Long Delayed Yes/No Recognition Hits   (8) --- 8         List A Long Delayed Yes/No Recognition False Alarms   (7) --- 27         List A Recognition Discriminability Index --- 8      Story Learning  Immediate Recall   (21, 28) 43 25         Delayed Recall   (22) 40 16         Percent Retention   (79 %) --- 42      Daily Living Memory            Immediate Recall   (20, 19) 51 54          Delayed Recall   (6, 7) 51 54          Percent Retention (87 %) --- 54          Recognition Hits   (8) --- 42      Repeatable Battery for the Assessment of Neuropsychological Status (Form A): Scaled Score Percentile         Figure Recall   (3) 3 1      Visuospatial/Constructional Functioning        Repeatable Battery for the Assessment of Neuropsychological Status (Form A): Standard/Scaled Score Percentile      Visuospatial/Constructional Index 92 30         Figure Copy   (19) 12 75         Judgment of Line Orientation   (12) --- 3-9      Executive Functioning        Modified [employer] Test (MWCST): Standard/T-Score Percentile      Number of Categories Correct 51 54      Number of Perseverative Errors 74 99      Number of Total Errors 61 86      Percent Perseverative Errors 69 97  Executive Function Composite 121 92      Trail Making Test: T-Score Percentile      Part A 59 82      Part B 56 73      [place] Diagnostic Aphasia Exam: Raw Score Scaled Score      Complex Ideational Material 11 9      Clock Drawing Raw Score Descriptor      Command 9 WNL      Rating Scales        Clinical  Dementia Rating Raw Score Descriptor      Sum of Boxes 1.5 Mild Cognitive Impairment      Global Score 0.5 MCI      Quick Dementia Rating System Raw Score Descriptor      Sum of Boxes 2 MCI      Total Score 3.5 MCI  Geriatric Depression Scale - Short Form 8 Positive   Brody Kump V. Roseanne Reno PsyD, ABN Clinical Neuropsychologist
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
## 2021-09-17 DIAGNOSIS — E785 Hyperlipidemia, unspecified: Secondary | ICD-10-CM | POA: Insufficient documentation

## 2021-09-17 NOTE — Progress Notes (Signed)
Cardiology Office Note   Date:  09/18/2021   ID:  Shelby Robinson, DOB Jun 09, 1946, MRN [id number]  PCP:  Shelby Chessman, MD  Cardiologist:   Shelby Rotunda, MD    Chief Complaint  Patient presents with   Fall       History of Present Illness: Shelby Robinson is a 75 y.o. female who presents for evaluation of valve disease. She had a reported history of mitral regurgitation and aortic insufficiency on echo prior to my first appt with her.  I ordered an echo in Nov 2017 which showed mild AI and NL LV EF.  She returns for follow up.  At the last visit her BP was elevated so I added low dose spironolactone.    Since I last saw her she has had continued problems with falling.  She has seen a neurologist.  I reviewed an MRI that was unremarkable.  She does not think she is passing out.  She will be standing and she has fallen backwards.  She describes about 6 falls.  One of them was getting up from getting some orange juice but most of them are just with standing and not particularly positional.  She does not see the room spinning.  She does not feel her heart racing or skipping.  She does not think she actually loses consciousness.  However, these sound like they are hard falls.  She has been told she had some healed optic nerve hemorrhage but no other findings.  Her blood pressures have been running a little bit high.      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     Calcium Carbonate Antacid (CHOOZ PO) Take by mouth as needed.     cetirizine (ZYRTEC) 10 MG tablet Take 10 mg by mouth as needed for allergies.     Coenzyme Q10 200 MG TABS Take 30 mg by mouth 3 (three) times daily.     fluticasone (FLONASE) 50 MCG/ACT nasal spray Place 1 spray into both nostrils daily as needed for allergies or rhinitis.     folic acid (FOLVITE) 400 MCG tablet Take 400 mcg by mouth daily.     Magnesium 400 MG CAPS Take 400 mg by mouth daily.     Melatonin 10 MG TABS Take 10 mg by mouth as needed.     meloxicam (MOBIC) 7.5 MG tablet Take 1 tablet by mouth daily.     metFORMIN (GLUCOPHAGE) 500 MG tablet Take 1,000 mg by mouth in the morning and at bedtime.     metoprolol (TOPROL-XL) 200 MG 24 hr tablet Take 200 mg by mouth daily.     montelukast (SINGULAIR) 10 MG tablet montelukast 10 mg tablet  NON FORMULARY Biotin rinse as needed for dry mouth     Omega-3 Fatty Acids (FISH OIL OMEGA-3 PO) Take 360 mg by mouth.     polyethylene glycol (MIRALAX / GLYCOLAX) 17 g packet Take 17 g by mouth daily.     PRESCRIPTION MEDICATION Mega benfotiamine 250mg  once daily helps blood sugar     spironolactone (ALDACTONE) 25 MG tablet TAKE 1/2 TABLET BY MOUTH DAILY 45 tablet 3   vitamin C (ASCORBIC ACID) 500 MG tablet Take 500 mg by mouth daily.     VITAMIN D PO Take by mouth.     5-Hydroxytryptophan (5-HTP) 100 MG CAPS Take by mouth. (Patient not taking: Reported on 09/18/2021)     No current facility-administered medications for this visit.    Allergies:   Angiotensin i, human [angiotensin]; Flagyl [metronidazole]; Codeine; Nalbuphine; Ace inhibitors; Angiotensin ii; Angiotensin receptor blockers; Chicken-peas-carrots [alitraq]; Other; Tree extract; Cottonseed oil; and Nickel   ROS:  Please see the history of present illness.   Otherwise, review of systems are positive for  none.   All other systems are reviewed and negative.    PHYSICAL EXAM: VS:  BP 130/84   Pulse 64   Ht 5\' 4"  (1.626 m)   Wt 141 lb 6.4 oz (64.1 kg)   SpO2 97%   BMI 24.27 kg/m  , BMI Body mass index is 24.27 kg/m. GENERAL:  Well appearing NECK:  No jugular venous distention, waveform within normal limits, carotid upstroke brisk and symmetric, no bruits, no thyromegaly LUNGS:  Clear to auscultation bilaterally CHEST:  Unremarkable HEART:  PMI not displaced or sustained,S1 and S2 within normal limits, no S3, no S4, no clicks, no rubs, no murmurs ABD:  Flat, positive bowel sounds normal in frequency in pitch, positive bruits, no rebound, no guarding, no midline pulsatile mass, no hepatomegaly, no splenomegaly EXT:  2 plus pulses throughout, no edema, no cyanosis no clubbing   EKG:  EKG is  ordered today. Sinus rhythm, rate 64, axis within normal limits, intervals within normal limits, poor anterior R wave progression. This is unchanged from previous EKGs.   Lipid Panel No results found for: CHOL, TRIG, HDL, CHOLHDL, VLDL, LDLCALC, LDLDIRECT    Wt Readings from Last 3 Encounters:  09/18/21 141 lb 6.4 oz (64.1 kg)  06/21/21 141 lb 9.6 oz (64.2 kg)  09/23/20 147 lb 6.4 oz (66.9 kg)      Other studies Reviewed: Additional studies/ records that were reviewed today include: Non Review of the above records demonstrates: See elsewhere   ASSESSMENT AND PLAN:  AI:  This was mild on echo in 2017.  I would not suspect this to be different based on exam.  No change in therapy or further imaging.  BRUIT: She will need an abdominal ultrasound to rule out abdominal aortic aneurysm  FALLS: These do not sound like syncope.  However, I am going to start with a 4-week event monitor.  She was NOTorthostatic in the office today  HTN:   Blood pressure is mildly elevated but I am not going to alter her medicines given her falls until we have a further work-up.   DYSLIPIDEMIA: Earlier this year  LDL was 15 with an HDL of 62.  No change in therapy   Current medicines are reviewed at length with the patient today.  The patient does not have concerns regarding medicines.  The following changes have been made: None  Labs/ tests ordered today include:        Disposition:  FU with me or APP after the monitor.   Signed, Shelby Rotunda, MD  09/18/2021 12:21 PM    [facility]

## 2021-09-18 ENCOUNTER — Other Ambulatory Visit: Payer: Self-pay

## 2021-09-18 ENCOUNTER — Encounter: Payer: Self-pay | Admitting: Cardiology

## 2021-09-18 ENCOUNTER — Ambulatory Visit (INDEPENDENT_AMBULATORY_CARE_PROVIDER_SITE_OTHER): Payer: Medicare Other | Admitting: Cardiology

## 2021-09-18 VITALS — BP 130/84 | HR 64 | Ht 64.0 in | Wt 141.4 lb

## 2021-09-18 DIAGNOSIS — R0989 Other specified symptoms and signs involving the circulatory and respiratory systems: Secondary | ICD-10-CM

## 2021-09-18 DIAGNOSIS — I10 Essential (primary) hypertension: Secondary | ICD-10-CM

## 2021-09-18 DIAGNOSIS — I351 Nonrheumatic aortic (valve) insufficiency: Secondary | ICD-10-CM | POA: Diagnosis not present

## 2021-09-18 DIAGNOSIS — E785 Hyperlipidemia, unspecified: Secondary | ICD-10-CM

## 2021-09-18 DIAGNOSIS — R55 Syncope and collapse: Secondary | ICD-10-CM

## 2021-09-18 NOTE — Patient Instructions (Addendum)
Medication Instructions:  Your Physician recommend you continue on your current medication as directed.    *If you need a refill on your cardiac medications before your next appointment, please call your pharmacy*   Lab Work: None ordered today   Testing/Procedures: Your physician has recommended that you wear a 30 day event monitor. Event monitors are medical devices that record the heart's electrical activity. Doctors most often [place] these monitors to diagnose arrhythmias. Arrhythmias are problems with the speed or rhythm of the heartbeat. The monitor is a small, portable device. You can wear one while you do your normal daily activities. This is usually used to diagnose what is causing palpitations/syncope (passing out).  Your physician has requested that you have an abdominal aorta duplex. During this test, an ultrasound is used to evaluate the aorta. Allow 30 minutes for this exam. Do not eat after midnight the day before and avoid carbonated beverages  [address]   Follow-Up: At [facility], you and your health needs are our priority.  As part of our continuing mission to provide you with exceptional heart care, we have created designated Provider Care Teams.  These Care Teams include your primary Cardiologist (physician) and Advanced Practice Providers (APPs -  Physician Assistants and Nurse Practitioners) who all work together to provide you with the care you need, when you need it.  We recommend signing up for the patient portal called "MyChart".  Sign up information is provided on this After Visit Summary.  MyChart is used to connect with patients for Virtual Visits (Telemedicine).  Patients are able to view lab/test results, encounter notes, upcoming appointments, etc.  Non-urgent messages can be sent to your provider as well.   To learn more about what you can do with MyChart, go to [email or web address].    Your next appointment:   2 month(s)  The format  for your next appointment:   In Person  Provider:   Rollene Rotunda, MD

## 2021-09-21 ENCOUNTER — Encounter: Payer: Self-pay | Admitting: Counselor

## 2021-09-21 ENCOUNTER — Ambulatory Visit (INDEPENDENT_AMBULATORY_CARE_PROVIDER_SITE_OTHER): Payer: Medicare Other | Admitting: Counselor

## 2021-09-21 ENCOUNTER — Other Ambulatory Visit: Payer: Self-pay

## 2021-09-21 DIAGNOSIS — F32A Depression, unspecified: Secondary | ICD-10-CM | POA: Diagnosis not present

## 2021-09-21 NOTE — Patient Instructions (Signed)
Your performance and presentation on neuropsychological assessment were consistent with broadly normal performance. You did have some scattered low scores on delayed recall of visual information and on generation of words in response to the category prompt, although I do not feel that these findings meet the burden for a frank diagnosis of cognitive impairment. I would note that you also did very well, with the superior range score on one measure of executive abilities involving attention and memory and your performance was within normal limits on other measures of attention and working memory. The best diagnosis is thus no diagnosis.  Normal neuropsychological performance does not entirely rule out the possibility of some cognitive problems. It does provide strong evidence against an impression of any significant or severe decline, and certainly a dementia level problem in your case. I would like to reassure you that it does not appear that you have dementia.  In addition to your normal test scores, you had a normal metabolic PET scan of the brain and you have serial MRI over the past 3 years that do not show any appreciable volume loss. These findings are further reassurance that there do not appear to be any visible indications of neurodegeneration and/or abnormal brain metabolism.  You did screen positive for the presence of depression. Depression can affect cognitive functioning in several ways. For one, there are neurobiological changes in depression that can contribute to attention, concentration, and memory problems. These changes are so common they are part of the criteria we use to diagnose depressive disorders. Depression can also negatively impact your own appraisal of your cognitive abilities, leading you to feel like you are performing more poorly than is objectively warranted. Recommended treatments for depression include medication and psychotherapy, which we will discuss.  There are numerous  things that you can do to improve your quality of life and minimize your risk of cognitive decline in the future if you are concerned. First, it sounds like you have had numerous physical changes lately and have limited mobility. This contributes to feelings of decline that can exacerbate and/or cause depression, and may even contribute to memory problems. You should discuss physical therapy or other endurance training with your primary care physician.  As with all patients, I would recommend that you continue to engage in healthy lifestyle choices including a balanced heart-healthy, brain healthy diet such as the MIND-DASH diet, get regular exercise (30 minutes at least 4 times a week), engage in satisfying social interactions with others, actively manage stress, and continue to treat underlying risk factors for cognitive impairment such as high blood pressure, high cholesterol, and sleep apnea.   There is now good quality evidence from at least one large scale study that a modified mediterranean diet may help slow cognitive decline. This is known as the "MIND" diet. The Mind diet is not so much a specific diet as it is a set of recommendations for things that you should and should not eat.   Foods that are ENCOURAGED on the MIND Diet:  Green, leafy vegetables: Aim for six or more servings per week. This includes kale, spinach, cooked greens and salads.  All other vegetables: Try to eat another vegetable in addition to the green leafy vegetables at least once a day. It is best to choose non-starchy vegetables because they have a lot of nutrients with a low number of calories.  Berries: Eat berries at least twice a week. There is a plethora of research on strawberries, and other berries such as  blueberries, raspberries and blackberries have also been found to have antioxidant and brain health benefits.  Nuts: Try to get five servings of nuts or more each week. The creators of the MIND diet don't specify  what kind of nuts to consume, but it is probably best to vary the type of nuts you eat to obtain a variety of nutrients. Peanuts are a legume and do not fall into this category.  Olive oil: Use olive oil as your main cooking oil. There may be other heart-healthy alternatives such as algae oil, though there is not yet sufficient research upon which to base a formal recommendation.  Whole grains: Aim for at least three servings daily. Choose minimally processed grains like oatmeal, quinoa, brown rice, whole-wheat pasta and 100% whole-wheat bread.  Fish: Eat fish at least once a week. It is best to choose fatty fish like salmon, sardines, trout, tuna and mackerel for their high amounts of omega-3 fatty acids.  Beans: Include beans in at least four meals every week. This includes all beans, lentils and soybeans.  Poultry: Try to eat chicken or [place] at least twice a week. Note that fried chicken is not encouraged on the MIND diet.  Wine: Aim for no more than one glass of alcohol daily. Both red and white wine may benefit the brain. However, much research has focused on the red wine compound resveratrol, which may help protect against Alzheimer's disease.  Foods that are DISCOURAGED on the MIND Diet: Butter and margarine: Try to eat less than 1 tablespoon (about 14 grams) daily. Instead, try using olive oil as your primary cooking fat, and dipping your bread in olive oil with herbs.  Cheese: The MIND diet recommends limiting your cheese consumption to less than once per week.  Red meat: Aim for no more than three servings each week. This includes all beef, pork, lamb and products made from these meats.  [place] food: The MIND diet highly discourages fried food, especially the kind from fast-food restaurants. Limit your consumption to less than once per week.  Pastries and sweets: This includes most of the processed junk food and desserts you can think of. Ice cream, cookies, brownies, snack cakes, donuts,  candy and more. Try to limit these to no more than four times a week.   There are few things as disruptive to brain functioning as not getting a good night's sleep. For sleep, I recommend against using medications, which can have lingering sedating effects on the brain and rob your brain of restful REM sleep. Instead, consider trying some of the following sleep hygiene recommendations. They may not work at once and may take effort, but the effort you spend is likely to be rewarded with better sleep eventually:  Stick to a sleep schedule of the same bedtime and wake time even on the weekends, which can help to regulate your body's internal clock so that you fall asleep and stay asleep.  Practice a relaxing bedtime ritual (conducted away from bright lights) which will help separate your sleep from stimulating activities and prepare your body to fall asleep when you go to bed.  Avoid naps, especially in the afternoon.  Evaluate your room and create conditions that will promote sleep such as keeping it cool (between 60 - 67 degrees), quiet, and free from any lights. Consider using blackout curtains, a "white noise" generator, or fan that will help mask any noises that might prevent you from going to sleep or awaken you during the night.  Sleep on a comfortable mattress and pillows.  Avoid bright light in the evening and excessive use of portable electronic devices right before bed that may contain light frequencies that can contribute to sleep problems.  Avoid alcohol, cigarettes, or heavy meals in the evening. If you must eat, consume a light snack 45 minutes before bed.  Use your bed only for sleep to strengthen the association between your bed and sleep.  If you can't go to sleep within 30 minutes, go into another room and do something relaxing until you feel tired. Then, come back and try to go to sleep again for 30 minutes and repeat until sleep is achieved.  Some people find over the counter melatonin  to be helpful for sleep, which you could discuss with a pharmacist or prescribing provider.   We discussed consulting with your PCP for a sleep study, to make sure that you do not have OSA or other sleep problems.   We discussed alternative modes of exercise, such as water aerobics, yoga, or even chair exercises.   Of course, you can return for testing in the future if you feel that your condition is worsening, although I do not think we need to set up an appointment at this time because it is not clear that you have a condition at risk for decline.

## 2021-09-21 NOTE — Progress Notes (Signed)
[facility]  Feedback Note: I met with Truc Winfree to review the findings resulting from her neuropsychological evaluation. Since the last appointment, she has been about the same. She continues to struggle with physical issues, related to her hip, and also had a visit with cardiology. They were worried about her recurrent falls and ordered an event monitor. Time was spent reviewing the impressions and recommendations that are detailed in the evaluation report. We discussed impression of normal cognitive performance, as reflected in the patient instructions. I also reviewed with her the multiple other pieces of reassuring information including serial MRI with no impression of progressive volume loss and metabolic PET scan that was negative. I worked on Engineer, building services, but she is not ready to make changes or has reasons why she cannot. I took time to explain the findings and answer all the patient's questions. I encouraged Ms. Jorden to contact me should she have any further questions or if further follow up is desired.   Current Medications and Medical History   Current Outpatient Medications  Medication Sig Dispense Refill   5-Hydroxytryptophan (5-HTP) 100 MG CAPS Take by mouth. (Patient not taking: Reported on 09/18/2021)     atorvastatin (LIPITOR) 10 MG tablet Take 10 mg by mouth daily.     Calcium Carbonate Antacid (CHOOZ PO) Take by mouth as needed.     cetirizine (ZYRTEC) 10 MG tablet Take 10 mg by mouth as needed for allergies.     Coenzyme Q10 200 MG TABS Take 30 mg by mouth 3 (three) times daily.     fluticasone (FLONASE) 50 MCG/ACT nasal spray Place 1 spray into both nostrils daily as needed for allergies or rhinitis.     folic acid (FOLVITE) 657 MCG tablet Take 400 mcg by mouth daily.     Magnesium 400 MG CAPS Take 400 mg by mouth daily.     Melatonin 10 MG TABS Take 10 mg by mouth as needed.     meloxicam (MOBIC) 7.5 MG tablet Take 1  tablet by mouth daily.     metFORMIN (GLUCOPHAGE) 500 MG tablet Take 1,000 mg by mouth in the morning and at bedtime.     metoprolol (TOPROL-XL) 200 MG 24 hr tablet Take 200 mg by mouth daily.     montelukast (SINGULAIR) 10 MG tablet montelukast 10 mg tablet     NON FORMULARY Biotin rinse as needed for dry mouth     Omega-3 Fatty Acids (FISH OIL OMEGA-3 PO) Take 360 mg by mouth.     polyethylene glycol (MIRALAX / GLYCOLAX) 17 g packet Take 17 g by mouth daily.     PRESCRIPTION MEDICATION Mega benfotiamine 280m once daily helps blood sugar     spironolactone (ALDACTONE) 25 MG tablet TAKE 1/2 TABLET BY MOUTH DAILY 45 tablet 3   vitamin C (ASCORBIC ACID) 500 MG tablet Take 500 mg by mouth daily.     VITAMIN D PO Take by mouth.     No current facility-administered medications for this visit.    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 Speech was normal in rate, rhythm, volume, and prosody. Self-reported mood was depressed and affect was mood congruent and dysphoric. Thought process was logical and goal oriented and thought content was logical and goal oriented. There were no safety concerns identified at today's encounter, such as thoughts of harming self or others.   Plan  Feedback provided regarding the patient's neuropsychological evaluation. I think her depression is likely playing a larger role in her memory problems than initially suspected on the basis of our conversation today. Encouraged her to make small steps towards behavioral activation and  health. Dessiree Sze was encouraged to contact me if any questions arise or if further follow up is desired.   Viviano Simas Nicole Kindred, PsyD, ABN Clinical Neuropsychologist  Service(s) Provided at This Encounter: 18 minutes [phone]; Psychotherapy with patient/family)

## 2021-09-25 ENCOUNTER — Ambulatory Visit (INDEPENDENT_AMBULATORY_CARE_PROVIDER_SITE_OTHER): Payer: Medicare Other

## 2021-09-25 DIAGNOSIS — R55 Syncope and collapse: Secondary | ICD-10-CM | POA: Diagnosis not present

## 2021-09-27 ENCOUNTER — Other Ambulatory Visit: Payer: Self-pay

## 2021-09-27 ENCOUNTER — Ambulatory Visit (HOSPITAL_COMMUNITY)
Admission: RE | Admit: 2021-09-27 | Discharge: 2021-09-27 | Disposition: A | Discharge status: Home / Self Care | Payer: Medicare Other | Source: Ambulatory Visit | Attending: Cardiology | Admitting: Cardiology

## 2021-09-27 DIAGNOSIS — R0989 Other specified symptoms and signs involving the circulatory and respiratory systems: Secondary | ICD-10-CM | POA: Insufficient documentation

## 2021-10-09 ENCOUNTER — Encounter: Payer: Medicare Other | Admitting: Counselor

## 2021-10-17 ENCOUNTER — Encounter: Payer: Medicare Other | Admitting: Counselor

## 2021-10-30 NOTE — Progress Notes (Signed)
Assessment/Plan:   Depressive disorder with subjective memory loss  Significant amount of this visit was spent discussing the MRI findings, and the images were shown to her.  Also discussed, the findings with neuropsychological testing, not yielding a diagnosis of dementia or mild cognitive impairment.  It appears that her memory deficiencies or difficulties are related to unresolved emotional issues and depression. At this time, the patient admits that she is ready to pursue behavioral therapy.  Recommendations Referral to behavioral therapy  Discussed the importance of regular daily schedule with inclusion of crossword puzzles to maintain brain function.  Continue to monitor mood  Stay active at least 30 minutes at least 3 times a week.  Naps should be scheduled and should be no longer than 60 minutes and should not occur after 2 PM.  Mediterranean diet is recommended  Folllow up as needed.  If memory worsens, recommend repeat neurocognitive testing for clarity of the diagnosis.   Subjective:     The patient is a 75 y.o. year old RH female hypertension, hyperlipidemia, DM2, hyperthyroidism, B12 deficiency,Vit D deficiency, migraines, and a history of recurrent TGA seen today for evaluation of memory loss.  She was last seen at our office on 06/21/2021, at which time MoCA was 18/30.  In review, she had been initially seen on 04/18/2018 at [facility] by Dr. Daisy Blossom after experiencing TGA on 02/04/2018 during an ED presentation for hypertensive event.  When she was seeing, she felt that her memory was still not good, which prompted her for further work-up.  MRI of the brain was negative for acute changes.she had a neuropsychological evaluation on 09/13/2021, which was unable to diagnose cognitive impairment due to dementia.  During this visit, she states that she still forgetful, not remembering appointments, and repeating the same questions and stories.  She lives with her husband [name] whom she did  not wish to bring.  She admits to some unresolved family issues.  She continues to have vivid dreams, unsure if she acts out on them, and denies sleepwalking.  She takes her own medications, occasionally forgetting some doses, and it is unclear who is taking care of the finances, she provides vague answers to these.  During her last visit she said that this was done by her husband, which contradicts the version provided to the neuropsychologist.  She denies leaving objects in unusual places. She is independent of bathing and dressing.  Her appetite is about the same, does not cook often and denies leaving the stove on.  She finds herself more depressed although she never been diagnosed with depression, at times he may be irritable.  No recent falls.  She denies any acute vision changes. She denies dizziness, focal numbness or tingling, unilateral weakness or tremors.  She has urinary incontinence, "at times I wake up wet ".  She has intermittent constipation and diarrhea.  Denies anosmia.  Denies a history of OSA, alcohol or tobacco.  Family history is remarkable for dementia in her mother at age 47, she also has a brother with CADASIL in his 26s( Cerebral Autosomal Dominant Arteriopathy with Subcortical Infarcts and Leukoencephalopathy) . Neuropsychological evaluation discussed the impression of normal cognitive performance, and all her prior imaging including the MRI of the brain were reviewed, which did not show progressive volume loss.  Prior metabolic PET scan was negative as well.  Neuropsychology were to motivational enhancement, at the time she was not ready to make changes.    EEG 01/2018 was normal with no  focal hemispheric or lateralizing features.  02/05/18 MRI brain not suggestive of CADASIL, no stroke No acute intracranial abnormality and normal for age noncontrast.Small nonspecific 15 mm soft tissue nodule along the anterior left parotid space. 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    PET scan 05/06/18 No decreased relative cortical metabolism to suggest frontotemporal dementia pathology or Alzheimer's type pathology.   CT of the head without contrast in April 2021 showed no acute intracranial findings, chronic microvascular ischemic changes and cerebral volume loss  Repeat CT of the head without contrast taken for frequent falls in December 2021 showed no acute intracranial abnormality.  Allergies  Allergen Reactions   Angiotensin I, Human [Angiotensin] Swelling    Suspect angioedema   Flagyl [Metronidazole] Anaphylaxis and Hives   Codeine Nausea Only, Palpitations and Other (See Comments)    headache   Nalbuphine Other (See Comments)     Mental Status Changes    Ace Inhibitors    Angiotensin Ii Swelling   Angiotensin Receptor Blockers    Chicken-Peas-Carrots [Alitraq]    Other     To BP meds-unable to remember; tree nuts    Tree Extract     Tree nuts   Cottonseed Oil Hives   Nickel Hives    Current Outpatient Medications  Medication Instructions   5-Hydroxytryptophan (5-HTP) 100 MG CAPS Oral   atorvastatin (LIPITOR) 10 mg, Oral, Daily   Calcium Carbonate Antacid (CHOOZ PO) Oral, As needed   cetirizine (ZYRTEC) 10 mg, Oral, As needed   Coenzyme Q10 30 mg, Oral, 3 times daily   fluticasone (FLONASE) 50 MCG/ACT nasal spray 1 spray, Each Nare, Daily PRN   folic acid (FOLVITE) [id number] mcg, Oral, Daily   Magnesium 400 mg, Oral, Daily   Melatonin 10 mg, Oral, As needed   meloxicam (MOBIC) 7.5 MG tablet 1 tablet, Oral, Daily   metFORMIN (GLUCOPHAGE) 1,000 mg, Oral, 2 times daily   metoprolol (TOPROL-XL) 200 mg, Oral, Daily   montelukast (SINGULAIR) 10 MG tablet montelukast 10 mg tablet   NON FORMULARY Biotin rinse as needed for dry mouth   Omega-3 Fatty Acids (FISH OIL OMEGA-3 PO) 360 mg, Oral   polyethylene glycol (MIRALAX / GLYCOLAX) 17 g, Oral, Daily   PRESCRIPTION MEDICATION Mega benfotiamine 250mg  once daily helps blood sugar    spironolactone (ALDACTONE) 25 MG tablet TAKE 1/2 TABLET BY MOUTH DAILY   vitamin C (ASCORBIC ACID) 500 mg, Oral, Daily   VITAMIN D PO Oral     VITALS:   Vitals:   10/31/21 0912  BP: 132/74  Pulse: 69  Resp: 20  SpO2: 99%  Weight: 145 lb (65.8 kg)  Height: 5\' 4"  (1.626 m)    No flowsheet data found.  HEENT:  Normocephalic, atraumatic. The mucous membranes are moist. The superficial temporal arteries are without ropiness or tenderness. Cardiovascular: Regular rate and rhythm. Lungs: Clear to auscultation bilaterally. Neck: There are no carotid bruits noted bilaterally.  NEUROLOGICAL:  Orientation:   Montreal Cognitive Assessment  06/21/2021  Visuospatial/ Executive (0/5) 3  Naming (0/3) 3  Attention: Read list of digits (0/2) 1  Attention: Read list of letters (0/1) 0  Attention: Serial 7 subtraction starting at 100 (0/3) 2  Language: Repeat phrase (0/2) 0  Language : Fluency (0/1) 1  Abstraction (0/2) 2  Delayed Recall (0/5) 1  Orientation (0/6) 5  Total 18  Adjusted Score (based on education) 18   Alert and oriented to person, place and time. No aphasia or dysarthria.  Fund of knowledge is appropriate. Recent memory impaired and remote memory intact.  Attention and concentration are impaired.  Able to name objects and. Repeat phrases 1/2 . Delayed recall  1/5 Cranial nerves: There is good facial symmetry. Extraocular muscles are intact and visual fields are full to confrontational testing. Speech is fluent and clear. Soft palate rises symmetrically and there is no tongue deviation. Hearing is intact to conversational tone. Tone: Tone is good throughout. Sensation: Sensation is intact to light touch and pinprick throughout. Vibration is intact at the bilateral big toe.There is no extinction with double simultaneous stimulation. There is no sensory dermatomal level identified. Coordination: The patient has no difficulty with RAM's or FNF bilaterally. Normal finger to nose   Motor: Strength is 5/5 in the bilateral upper and lower extremities. There is no pronator drift. There are no fasciculations noted. DTR's: Deep tendon reflexes are 2/4 at the bilateral biceps, triceps, brachioradialis, patella and achilles.  Plantar responses are downgoing bilaterally. Gait and Station: The patient is able to ambulate without difficulty.The patient is able to heel toe walk without any difficulty.The patient is able to ambulate in a tandem fashion. The patient is able to stand in the Romberg position.   CBC Latest Ref Rng & Units 02/05/2018 02/04/2018 02/03/2018  WBC 4.0 - 10.5 K/uL 7.3 7.5 9.5  Hemoglobin 12.0 - 15.0 g/dL 13.4 13.5 14.4  Hematocrit 36.0 - 46.0 % 41.2 40.8 42.6  Platelets 150 - 400 K/uL 187 196 221     CMP Latest Ref Rng & Units 10/01/2019 02/05/2018 02/04/2018  Glucose 65 - 99 mg/dL 99 110(H) 116(H)  BUN 8 - 27 mg/dL 14 15 11   Creatinine 0.57 - 1.00 mg/dL 0.78 0.93 0.93  Sodium 134 - 144 mmol/L 141 143 142  Potassium 3.5 - 5.2 mmol/L 4.8 3.5 3.7  Chloride 96 - 106 mmol/L 103 110 106  CO2 20 - 29 mmol/L 25 21(L) 22  Calcium 8.7 - 10.3 mg/dL 9.9 8.9 9.2  Total Protein 6.5 - 8.1 g/dL - - -  Total Bilirubin 0.3 - 1.2 mg/dL - - -  Alkaline Phos 38 - 126 U/L - - -  AST 15 - 41 U/L - - -  ALT 14 - 54 U/L - - -       Time spent in this visit have been 30 minutes Thank you for allowing [place] the opportunity to participate in the care of this nice patient. Please do not hesitate to contact us for any questions or concerns.    Cc:  Robyne Peers, MD  Sharene Butters 10/31/2021 12:23 PM

## 2021-10-31 ENCOUNTER — Ambulatory Visit (INDEPENDENT_AMBULATORY_CARE_PROVIDER_SITE_OTHER): Payer: Medicare Other | Admitting: Physician Assistant

## 2021-10-31 ENCOUNTER — Other Ambulatory Visit: Payer: Self-pay

## 2021-10-31 ENCOUNTER — Encounter: Payer: Self-pay | Admitting: Physician Assistant

## 2021-10-31 VITALS — BP 132/74 | HR 69 | Resp 20 | Ht 64.0 in | Wt 145.0 lb

## 2021-10-31 DIAGNOSIS — R413 Other amnesia: Secondary | ICD-10-CM

## 2021-10-31 DIAGNOSIS — F32A Depression, unspecified: Secondary | ICD-10-CM | POA: Diagnosis not present

## 2021-10-31 NOTE — Patient Instructions (Signed)
It was a pleasure to see you today at our office.   Recommendations:  Follow up as need  Referral to Behavioral Therapy   There are some activities which have therapeutic value and can be useful in keeping you cognitively stimulated. You can try this website: https://[email or web address].org/get-to-know-barrow/centers-programs/neurorehabilitation-center/neuro-rehab-apps-and-games/ which has options, categorized by level of difficulty.   RECOMMENDATIONS FOR ALL PATIENTS WITH MEMORY PROBLEMS: 1. Continue to exercise (Recommend 30 minutes of walking everyday, or 3 hours every week) 2. Increase social interactions - continue going to [place] and enjoy social gatherings with friends and family 3. Eat healthy, avoid fried foods and eat more fruits and vegetables 4. Maintain adequate blood pressure, blood sugar, and blood cholesterol level. Reducing the risk of stroke and cardiovascular disease also helps promoting better memory. 5. Avoid stressful situations. Live a simple life and avoid aggravations. Organize your time and prepare for the next day in anticipation. 6. Sleep well, avoid any interruptions of sleep and avoid any distractions in the bedroom that may interfere with adequate sleep quality 7. Avoid sugar, avoid sweets as there is a strong link between excessive sugar intake, diabetes, and cognitive impairment We discussed the Mediterranean diet, which has been shown to help patients reduce the risk of progressive memory disorders and reduces cardiovascular risk. This includes eating fish, eat fruits and green leafy vegetables, nuts like almonds and hazelnuts, walnuts, and also use olive oil. Avoid fast foods and fried foods as much as possible. Avoid sweets and sugar as sugar use has been linked to worsening of memory function.  There is always a concern of gradual progression of memory problems. If this is the case, then we may need to adjust level of care according to patient needs. Support, both  to the patient and caregiver, should then be put into place.      You have been referred for a neuropsychological evaluation (i.e., evaluation of memory and thinking abilities). Please bring someone with you to this appointment if possible, as it is helpful for the doctor to hear from both you and another adult who knows you well. Please bring eyeglasses and hearing aids if you wear them.    The evaluation will take approximately 3 hours and has two parts:   The first part is a clinical interview with the neuropsychologist (Dr. Milbert Coulter or Dr. Roseanne Reno). During the interview, the neuropsychologist will speak with you and the individual you brought to the appointment.    The second part of the evaluation is testing with the doctor's technician Annabelle Harman or Selena Batten). During the testing, the technician will ask you to remember different types of material, solve problems, and answer some questionnaires. Your family member will not be present for this portion of the evaluation.   Please note: We must reserve several hours of the neuropsychologist's time and the psychometrician's time for your evaluation appointment. As such, there is a No-Show fee of $100. If you are unable to attend any of your appointments, please contact our office as soon as possible to reschedule.    FALL PRECAUTIONS: Be cautious when walking. Scan the area for obstacles that may increase the risk of trips and falls. When getting up in the mornings, sit up at the edge of the bed for a few minutes before getting out of bed. Consider elevating the bed at the head end to avoid drop of blood pressure when getting up. Walk always in a well-lit room (use night lights in the walls). Avoid area rugs or power cords from  appliances in the middle of the walkways. Use a walker or a cane if necessary and consider physical therapy for balance exercise. Get your eyesight checked regularly.  FINANCIAL OVERSIGHT: Supervision, especially oversight when making  financial decisions or transactions is also recommended.  HOME SAFETY: Consider the safety of the kitchen when operating appliances like stoves, microwave oven, and blender. Consider having supervision and share cooking responsibilities until no longer able to participate in those. Accidents with firearms and other hazards in the house should be identified and addressed as well.   ABILITY TO BE LEFT ALONE: If patient is unable to contact 911 operator, consider using LifeLine, or when the need is there, arrange for someone to stay with patients. Smoking is a fire hazard, consider supervision or cessation. Risk of wandering should be assessed by caregiver and if detected at any point, supervision and safe proof recommendations should be instituted.  MEDICATION SUPERVISION: Inability to self-administer medication needs to be constantly addressed. Implement a mechanism to ensure safe administration of the medications.   DRIVING: Regarding driving, in patients with progressive memory problems, driving will be impaired. We advise to have someone else do the driving if trouble finding directions or if minor accidents are reported. Independent driving assessment is available to determine safety of driving.   If you are interested in the driving assessment, you can contact the following:  [facility] [employer] [facility] in [place] [phone]  [facility] [phone]  [facility] [phone] or [phone]    Mediterranean Diet A Mediterranean diet refers to food and lifestyle choices that are based on the traditions of countries located on the [employer] [place]. This way of eating has been shown to help prevent certain conditions and improve outcomes for people who have chronic diseases, like kidney disease and heart disease. What are tips for following this plan? Lifestyle  Cook and eat meals together with your family, when  possible. Drink enough fluid to keep your urine clear or pale yellow. Be physically active every day. This includes: Aerobic exercise like running or swimming. Leisure activities like gardening, walking, or housework. Get 7-8 hours of sleep each night. If recommended by your health care provider, drink red wine in moderation. This means 1 glass a day for nonpregnant women and 2 glasses a day for men. A glass of wine equals 5 oz (150 mL). Reading food labels  Check the serving size of packaged foods. For foods such as rice and pasta, the serving size refers to the amount of cooked product, not dry. Check the total fat in packaged foods. Avoid foods that have saturated fat or trans fats. Check the ingredients list for added sugars, such as corn syrup. Shopping  At the grocery store, buy most of your food from the areas near the walls of the store. This includes: Fresh fruits and vegetables (produce). Grains, beans, nuts, and seeds. Some of these may be available in unpackaged forms or large amounts (in bulk). Fresh seafood. Poultry and eggs. Low-fat dairy products. Buy whole ingredients instead of prepackaged foods. Buy fresh fruits and vegetables in-season from local farmers markets. Buy frozen fruits and vegetables in resealable bags. If you do not have access to quality fresh seafood, buy precooked frozen shrimp or canned fish, such as tuna, salmon, or sardines. Buy small amounts of raw or cooked vegetables, salads, or olives from the deli or salad bar at your store. Stock your pantry so you always have certain foods on hand, such as  olive oil, canned tuna, canned tomatoes, rice, pasta, and beans. Cooking  Cook foods with extra-virgin olive oil instead of using butter or other vegetable oils. Have meat as a side dish, and have vegetables or grains as your main dish. This means having meat in small portions or adding small amounts of meat to foods like pasta or stew. Use beans or  vegetables instead of meat in common dishes like chili or lasagna. Experiment with different cooking methods. Try roasting or broiling vegetables instead of steaming or sauteing them. Add frozen vegetables to soups, stews, pasta, or rice. Add nuts or seeds for added healthy fat at each meal. You can add these to yogurt, salads, or vegetable dishes. Marinate fish or vegetables using olive oil, lemon juice, garlic, and fresh herbs. Meal planning  Plan to eat 1 vegetarian meal one day each week. Try to work up to 2 vegetarian meals, if possible. Eat seafood 2 or more times a week. Have healthy snacks readily available, such as: Vegetable sticks with hummus. Greek yogurt. Fruit and nut trail mix. Eat balanced meals throughout the week. This includes: Fruit: 2-3 servings a day Vegetables: 4-5 servings a day Low-fat dairy: 2 servings a day Fish, poultry, or lean meat: 1 serving a day Beans and legumes: 2 or more servings a week Nuts and seeds: 1-2 servings a day Whole grains: 6-8 servings a day Extra-virgin olive oil: 3-4 servings a day Limit red meat and sweets to only a few servings a month What are my food choices? Mediterranean diet Recommended Grains: Whole-grain pasta. Brown rice. Bulgar wheat. Polenta. Couscous. Whole-wheat bread. Orpah Cobb. Vegetables: Artichokes. Beets. Broccoli. Cabbage. Carrots. Eggplant. Green beans. Chard. Kale. Spinach. Onions. Leeks. Peas. Squash. Tomatoes. Peppers. Radishes. Fruits: Apples. Apricots. Avocado. Berries. Bananas. Cherries. Dates. Figs. Grapes. Lemons. Melon. Oranges. Peaches. Plums. Pomegranate. Meats and other protein foods: Beans. Almonds. Sunflower seeds. Pine nuts. Peanuts. Cod. Salmon. Scallops. Shrimp. Tuna. Tilapia. Clams. Oysters. Eggs. Dairy: Low-fat milk. Cheese. Greek yogurt. Beverages: Water. Red wine. Herbal tea. Fats and oils: Extra virgin olive oil. Avocado oil. Grape seed oil. Sweets and desserts: [place] yogurt with honey.  Baked apples. Poached pears. Trail mix. Seasoning and other foods: Basil. Cilantro. Coriander. Cumin. Mint. Parsley. Sage. Rosemary. Tarragon. Garlic. Oregano. Thyme. Pepper. Balsalmic vinegar. Tahini. Hummus. Tomato sauce. Olives. Mushrooms. Limit these Grains: Prepackaged pasta or rice dishes. Prepackaged cereal with added sugar. Vegetables: Deep fried potatoes (french fries). Fruits: Fruit canned in syrup. Meats and other protein foods: Beef. Pork. Lamb. Poultry with skin. Hot dogs. Tomasa Blase. Dairy: Ice cream. Sour cream. Whole milk. Beverages: Juice. Sugar-sweetened soft drinks. Beer. Liquor and spirits. Fats and oils: Butter. Canola oil. Vegetable oil. Beef fat (tallow). Lard. Sweets and desserts: Cookies. Cakes. Pies. Candy. Seasoning and other foods: Mayonnaise. Premade sauces and marinades. The items listed may not be a complete list. Talk with your dietitian about what dietary choices are right for you. Summary The Mediterranean diet includes both food and lifestyle choices. Eat a variety of fresh fruits and vegetables, beans, nuts, seeds, and whole grains. Limit the amount of red meat and sweets that you eat. Talk with your health care provider about whether it is safe for you to drink red wine in moderation. This means 1 glass a day for nonpregnant women and 2 glasses a day for men. A glass of wine equals 5 oz (150 mL). This information is not intended to replace advice given to you by your health care provider. Make sure you discuss any  questions you have with your health care provider. Document Released: 07/26/2016 Document Revised: 08/28/2016 Document Reviewed: 07/26/2016 Elsevier Interactive Patient Education  2017 [employer].

## 2021-11-23 DIAGNOSIS — R0989 Other specified symptoms and signs involving the circulatory and respiratory systems: Secondary | ICD-10-CM | POA: Insufficient documentation

## 2021-11-23 DIAGNOSIS — W19XXXA Unspecified fall, initial encounter: Secondary | ICD-10-CM | POA: Insufficient documentation

## 2021-11-23 NOTE — Progress Notes (Signed)
Cardiology Office Note   Date:  11/24/2021   ID:  Shelby Robinson, DOB Oct 28, 1946, MRN [id number]  PCP:  [name], MD  Cardiologist:   Rollene Rotunda, MD    Chief Complaint  Patient presents with   Fall     History of Present Illness: Shelby Robinson is a 75 y.o. female who presents for evaluation of valve disease. She had a reported history of mitral regurgitation and aortic insufficiency on echo prior to my first appt with her.  I ordered an echo in Nov 2017 which showed mild AI and NL LV EF.  She returns for follow up.  At the last visit her BP was elevated so I added low dose spironolactone.    Since I last saw her she has done OK.  She is no longer having the fall that she was having.  She might have been dehydrated.  She was also diagnosed with depression as an etiology of some of her somatic complaints.  Her blood sugars are running low and her metformin was decreased.  She feels much better since then.  She comes with today.    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     atorvastatin (LIPITOR) 10 MG tablet Take 10 mg by mouth daily.     Calcium Carbonate Antacid (CHOOZ PO) Take by mouth as needed.     cetirizine (ZYRTEC) 10 MG tablet Take 10 mg by mouth as needed for allergies.     Coenzyme Q10 200 MG TABS Take 30 mg by mouth 3 (three) times daily.      fluticasone (FLONASE) 50 MCG/ACT nasal spray Place 1 spray into both nostrils daily as needed for allergies or rhinitis.     folic acid (FOLVITE) 400 MCG tablet Take 400 mcg by mouth daily.     Magnesium 400 MG CAPS Take 400 mg by mouth daily.     Melatonin 10 MG TABS Take 10 mg by mouth as needed.     meloxicam (MOBIC) 7.5 MG tablet Take 1 tablet by mouth daily.     metFORMIN (GLUCOPHAGE) 500 MG tablet Take 1,000 mg by mouth in the morning and at bedtime.     metoprolol (TOPROL-XL) 200 MG 24 hr tablet Take 200 mg by mouth daily.     montelukast (SINGULAIR) 10 MG tablet montelukast 10 mg tablet     NON FORMULARY Biotin rinse as needed for dry mouth     Omega-3 Fatty Acids (FISH OIL OMEGA-3 PO) Take 360 mg by mouth.     polyethylene glycol (MIRALAX / GLYCOLAX) 17 g packet Take 17 g by mouth daily.     PRESCRIPTION MEDICATION Mega benfotiamine 250mg  once daily helps blood sugar  spironolactone (ALDACTONE) 25 MG tablet TAKE 1/2 TABLET BY MOUTH DAILY 45 tablet 3   vitamin C (ASCORBIC ACID) 500 MG tablet Take 500 mg by mouth daily.     VITAMIN D PO Take by mouth.     No current facility-administered medications for this visit.    Allergies:   Angiotensin i, human [angiotensin]; Flagyl [metronidazole]; Codeine; Nalbuphine; Ace inhibitors; Angiotensin ii; Angiotensin receptor blockers; Chicken-peas-carrots [alitraq]; Other; Tree extract; Cottonseed oil; and Nickel   ROS:  Please see the history of present illness.   Otherwise, review of systems are positive for none.   All other systems are reviewed and negative.    PHYSICAL EXAM: VS:  BP (!) 142/74   Pulse (!) 59   Ht 5\' 4"  (1.626 m)   Wt 143 lb 9.6 oz (65.1 kg)   SpO2 96%   BMI 24.65 kg/m  , BMI Body mass index is 24.65 kg/m. GENERAL:  Well appearing NECK:  No jugular venous distention, waveform within normal limits, carotid upstroke brisk and symmetric, no bruits, no thyromegaly LUNGS:  Clear to auscultation bilaterally CHEST:   Unremarkable HEART:  PMI not displaced or sustained,S1 and S2 within normal limits, no S3, no S4, no clicks, no rubs, no murmurs ABD:  Flat, positive bowel sounds normal in frequency in pitch, no bruits, no rebound, no guarding, no midline pulsatile mass, no hepatomegaly, no splenomegaly EXT:  2 plus pulses throughout, no edema, no cyanosis no clubbing    EKG:  EKG is not ordered today.   Lipid Panel No results found for: CHOL, TRIG, HDL, CHOLHDL, VLDL, LDLCALC, LDLDIRECT    Wt Readings from Last 3 Encounters:  11/24/21 143 lb 9.6 oz (65.1 kg)  10/31/21 145 lb (65.8 kg)  09/18/21 141 lb 6.4 oz (64.1 kg)      Other studies Reviewed: Additional studies/ records that were reviewed today include: None Review of the above records demonstrates: See elsewhere   ASSESSMENT AND PLAN:  AI: This was very mild on her previous echo.  I would not make any changes and I will follow this with physical exam.    FALLS:   She is no longer having these.  No change in therapy.   HTN:   Blood pressure is well controlled.  It is slightly elevated today but actually was running low previously so I will not make any changes.     Current medicines are reviewed at length with the patient today.  The patient does not have concerns regarding medicines.  The following changes have been made: None  Labs/ tests ordered today include:        Disposition:   FU with me or APP after the monitor.   Signed, 11/18/21, MD  11/24/2021 8:00 AM     [facility]

## 2021-11-24 ENCOUNTER — Other Ambulatory Visit: Payer: Self-pay

## 2021-11-24 ENCOUNTER — Ambulatory Visit (INDEPENDENT_AMBULATORY_CARE_PROVIDER_SITE_OTHER): Payer: Medicare Other | Admitting: Cardiology

## 2021-11-24 ENCOUNTER — Encounter: Payer: Self-pay | Admitting: Cardiology

## 2021-11-24 VITALS — BP 142/74 | HR 59 | Ht 64.0 in | Wt 143.6 lb

## 2021-11-24 DIAGNOSIS — E785 Hyperlipidemia, unspecified: Secondary | ICD-10-CM

## 2021-11-24 DIAGNOSIS — R0989 Other specified symptoms and signs involving the circulatory and respiratory systems: Secondary | ICD-10-CM | POA: Diagnosis not present

## 2021-11-24 DIAGNOSIS — I351 Nonrheumatic aortic (valve) insufficiency: Secondary | ICD-10-CM | POA: Diagnosis not present

## 2021-11-24 DIAGNOSIS — I10 Essential (primary) hypertension: Secondary | ICD-10-CM | POA: Diagnosis not present

## 2021-11-24 DIAGNOSIS — Z9181 History of falling: Secondary | ICD-10-CM | POA: Diagnosis not present

## 2021-11-24 DIAGNOSIS — W19XXXS Unspecified fall, sequela: Secondary | ICD-10-CM

## 2021-11-24 NOTE — Patient Instructions (Addendum)
Medication Instructions:  Your Physician recommend you continue on your current medication as directed.    *If you need a refill on your cardiac medications before your next appointment, please call your pharmacy*   Follow-Up: At [facility], you and your health needs are our priority.  As part of our continuing mission to provide you with exceptional heart care, we have created designated Provider Care Teams.  These Care Teams include your primary Cardiologist (physician) and Advanced Practice Providers (APPs -  Physician Assistants and Nurse Practitioners) who all work together to provide you with the care you need, when you need it.  We recommend signing up for the patient portal called "MyChart".  Sign up information is provided on this After Visit Summary.  MyChart is used to connect with patients for Virtual Visits (Telemedicine).  Patients are able to view lab/test results, encounter notes, upcoming appointments, etc.  Non-urgent messages can be sent to your provider as well.   To learn more about what you can do with MyChart, go to [email or web address].    Your next appointment:   2 years  The format for your next appointment:   In Person  Provider:   Rollene Rotunda, MD

## 2021-12-13 ENCOUNTER — Other Ambulatory Visit: Payer: Self-pay | Admitting: Cardiology

## 2022-06-15 NOTE — Therapy (Signed)
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    Constipation    Diabetes mellitus without complication (HCC)    GERD (gastroesophageal reflux disease)    Hypertension    Hyperthyroidism    Lumbar spondylosis    Osteoarthritis of lumbar spine    Synovitis of hip    Tarlov cyst    Past Surgical History:  Procedure Laterality Date   cataract     ELBOW SURGERY Right    HIP SURGERY     Bilateral THR   TOTAL HIP ARTHROPLASTY     VAGINAL HYSTERECTOMY     Patient Active Problem List   Diagnosis Date Noted   Bruit 11/23/2021   Fall 11/23/2021   Dyslipidemia 09/17/2021   Educated about COVID-19 virus infection 09/22/2020   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   Right hip pain 02/04/2018   Hypertension 02/04/2018   Amnesia 02/04/2018   Transient amnesia 02/03/2018   Aortic insufficiency 10/01/2016   Mitral regurgitation 10/01/2016    PCP: [name], MD  REFERRING PROVIDER: Ollen Gross, MD  REFERRING DIAG:  M25.551 (ICD-10-CM) - Pain in right hip  M54.50 (ICD-10-CM) - Low back pain    RATIONALE FOR EVALUATION AND TREATMENT: Rehabilitation  THERAPY DIAG:  Pain in right hip  Other low back pain  Radiculopathy, lumbar region  Difficulty in walking, not elsewhere classified  Other abnormalities of gait and mobility  Muscle weakness (generalized)  ONSET DATE:  2019  NEXT MD VISIT:    SUBJECTIVE:                                                                                                                                                                                           SUBJECTIVE STATEMENT: Pt reports h/o 3 hip surgeries - L THA in 2014 w/o any issues but R THA in 2016 has had ongoing issues with infection and loose hardware necessitating a return to  surgery for THA revision in 2019. She has lost significant muscle mass in her R hip from the 2nd surgery and has had ongoing intermittent bursitis but MD is now thinking her back may be contributing to her hip pain. Pain interferes with activities such as walking or bending over to make the bed.  PAIN:  Are you having pain? Yes: NPRS scale: 8-9/10 Pain location: R>L low back & down R LE to lower leg Pain description: stabbing Aggravating factors: rolling over in bed, transitional motions, bending, prolonged walking Relieving factors: Salon pas patches, heating pad, cabbage leaves  Are you having pain? Yes: NPRS scale: 5-6/10 on average basis, currently 7-8/10 Pain location: lateral R hip Pain description: sore, bruised Aggravating factors: rolling over of R hip in bed, leaning to R in sitting, vacuuming Relieving factors: Salon pas patch; Epsom salt bath   PERTINENT HISTORY:  B THA - L THA 2014, R THA 2016, R THA revision 2019; chronic R hip pain, synovitis of hip, lumbar spondylosis/OA, asthma, , DM-II, HTN, GERD, IBS, chronic diarrhea, bladder and rectal prolapse, hyperthyroidism, aortic insufficieny, aortic and mitral valve disorder, mitral regurgitation, neuropathy, RLS, h/o transient global amnesia  PRECAUTIONS: None  WEIGHT BEARING RESTRICTIONS No  FALLS:  Has patient fallen in last 6 months?  None in past 6 months but 2 bad falls in the past few years  LIVING ENVIRONMENT: Lives with: lives with their spouse Lives in: House/apartment Stairs: Yes: Internal: 14 steps; on right  going up and on left going up and External: 1 steps; none and ramp available  - mainly stays on main level of home Has following equipment at home: Single point cane, Walker - 4 wheeled, Grab bars, Ramped entry, and 3-wheel RW  OCCUPATION: Reitired  PLOF: Independent, Needs assistance with homemaking, and Leisure: "[employer]", does exercises in bed before she gets up in the morning  PATIENT GOALS: "Walk better"   OBJECTIVE:   DIAGNOSTIC FINDINGS:  MRI R hip - 02/05/18: Findings compatible with mild synovitis about the patient's right hip arthroplasty. Atrophy of the adductor brevis and magnus muscles bilaterally is worse on the left. Mild edema in the right adductor muscles is likely related to atrophy. No muscle tear is identified. Trace amount of fluid in the right trochanteric bursa compatible with bursitis. MRI Lumbar spine - 02/05/18: Some increase in the size of a down turning right paracentral protrusion at L4-5. The disc impinges on the descending right L5 root. There is mild to moderate central canal stenosis overall at L4-5.     No change in a shallow disc bulge and small down turning right paracentral protrusion at L3-4. Moderate central canal stenosis and narrowing in the subarticular recesses appear unchanged.    No change in moderate central canal stenosis and narrowing in the subarticular recesses at L2-3 where there is a disc bulge and endplate spur.      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   PATIENT SURVEYS:  FOTO Hip = 44; predicted = 48  SCREENING FOR RED FLAGS: Bowel or bladder incontinence: Yes: h/o prolapsed bladder and rectum Spinal tumors: No Cauda equina syndrome: No Compression fracture: No Abdominal aneurysm: No  COGNITION:  Overall cognitive status: Within functional limits for tasks assessed     SENSATION: TBD  MUSCLE LENGTH: Hamstrings:  ITB:  Piriformis:  Hip flexors:  Quads:   Heelcord:   POSTURE:  weight shift left  PALPATION: Decreased R hip incisional scar mobility  LUMBAR ROM:   Active  A/PROM  Eval - 06/26/22  Flexion Fingertips to ankles  Extension 25% limited  Right lateral flexion Hand to mid lower leg  Left lateral flexion Hand to lateral knee  Right rotation 25% limited   Left rotation 25% limited - pain   (Blank rows = not tested)  LOWER EXTREMITY ROM:     Active  Right eval Left eval  Hip flexion    Hip extension    Hip abduction    Hip adduction    Hip internal rotation    Hip external rotation    Knee flexion    Knee extension    Ankle dorsiflexion    Ankle plantarflexion    Ankle inversion    Ankle eversion     (Blank rows = not tested)  LOWER EXTREMITY MMT:    MMT Right eval Left eval  Hip flexion    Hip extension    Hip abduction    Hip adduction    Hip internal rotation    Hip external rotation    Knee flexion    Knee extension    Ankle dorsiflexion    Ankle plantarflexion    Ankle inversion    Ankle eversion     (Blank rows = not tested)  LUMBAR SPECIAL TESTS:  TBD  FUNCTIONAL TESTS:  TBD  GAIT: Distance walked: 50 Assistive device utilized: Single point cane Level of assistance: SBA Gait pattern: step through pattern, decreased stance time- Right, decreased stride length, and antalgic Comments: Cues necessary for proper sequencing of SPC using cane in L hand to offset issues with R hip   TODAY'S TREATMENT  06/26/22 Eval only   PATIENT EDUCATION:  Education details: PT eval findings, anticipated POC, and need for further assessment of LE ROM, flexibility and strength Person educated: Patient Education method: Explanation Education comprehension: verbalized understanding   HOME EXERCISE PROGRAM: TBD  ASSESSMENT:  CLINICAL IMPRESSION: Shelby Robinson is a 76 y.o. female who was seen today for physical therapy evaluation and treatment for R hip pain and R>L sided LBP with R LE  radiculopathy. She feels that her current pain stems from complications following her R THA including dislocation of the internal hip prosthesis and failure of the R THA which required revision surgery in 2019. She notes loss of muscle over R lateral hip with pronounced depression noted along her surgical incision at level of greater trochanter. She reports chronic R hip pain along with predominantly R-sided LBP with R LE radiculopathy interferes with ADLs, household activities, standing and walking tolerance causing her to intermittent use an AD for gait. Deficits include pain, abnormal antalgic gait pattern with intermittent dependence on AD, limited lumbar and R hip ROM as well as likely limited proximal LE/lumbopelvic flexibility and LE weakness (further assessment to be completed next visit). Shelby Robinson will benefit from skilled PT to address above deficits, improve flexibility and core/LE strength to improve positional and activity/gait tolerance w/o pain interference. Further balance assessment may also be indicated given her h/o falls.   OBJECTIVE IMPAIRMENTS: Abnormal gait, decreased activity tolerance, decreased balance, decreased endurance, decreased knowledge of condition, decreased knowledge of use of DME, decreased mobility, difficulty walking, decreased ROM, decreased strength, decreased safety awareness, increased fascial restrictions, impaired perceived functional ability, increased muscle spasms, impaired flexibility, improper body mechanics, postural dysfunction, and pain.   ACTIVITY LIMITATIONS: carrying, lifting, bending, standing, squatting, sleeping, stairs, transfers, bed mobility, and locomotion level  PARTICIPATION LIMITATIONS: meal prep, cleaning, laundry, driving, shopping, community  activity, and yard work  PERSONAL FACTORS: Age, Fitness, Past/current experiences, Time since onset of injury/illness/exacerbation, and 3+ comorbidities: B THA - L THA 2014, R THA 2016, R THA revision  2019; chronic R hip pain, synovitis of hip, lumbar spondylosis/OA, asthma, , DM-II, HTN, GERD, IBS, chronic diarrhea, bladder and rectal prolapse, hyperthyroidism, aortic insufficieny, aortic and mitral valve disorder, mitral regurgitation, neuropathy, RLS, h/o transient global amnesia  are also affecting patient's functional outcome.   REHAB POTENTIAL: Good  CLINICAL DECISION MAKING: Evolving/moderate complexity  EVALUATION COMPLEXITY: Moderate   GOALS: Goals reviewed with patient? Yes  SHORT TERM GOALS: Target date: 07/24/2022   Patient will be independent with initial HEP.  Baseline:  Goal status: INITIAL  2.  Patient will report centralization of radicular symptoms.  Baseline:  Goal status: INITIAL  LONG TERM GOALS: Target date: 08/21/2022    Patient will be independent with advanced/ongoing HEP to improve outcomes and carryover.  Baseline:  Goal status: INITIAL  2.  Patient will report 50-75% improvement in low back pain to improve QOL.  Baseline:  Goal status: INITIAL  3.  Patient will report at least 50-75% improvement in R hip pain to improve QOL. Baseline:  Goal status: INITIAL  4.  Patient to demonstrate ability to achieve and maintain good spinal alignment/posturing and body mechanics needed for daily activities. Baseline:  Goal status: INITIAL  5.  Patient will demonstrate full pain free lumbar ROM to perform ADLs.   Baseline:  Goal status: INITIAL  6.  Patient will demonstrate improved B LE strength to >/= 4+/5 for improved stability and ease of mobility . Baseline:  Goal status: INITIAL  7.  Patient will report 48 on hip FOTO to demonstrate improved functional ability.  Baseline: 44 Goal status: INITIAL   8.  Patient will tolerate 20-30 min of standing and/or walking to perform chores, cooking or grocery shopping. Baseline:  Goal status: INITIAL  9.  Patient will demonstrate at least 19/24 on DGI to decrease risk of falls. Baseline:  Goal status:  INITIAL   PLAN: PT FREQUENCY: 2x/week  PT DURATION: 8 weeks  PLANNED INTERVENTIONS: Therapeutic exercises, Therapeutic activity, Neuromuscular re-education, Balance training, Gait training, Patient/Family education, Joint mobilization, Stair training, DME instructions, Dry Needling, Electrical stimulation, Spinal mobilization, Cryotherapy, Moist heat, scar mobilization, Taping, Traction, Ultrasound, Ionotophoresis 4mg /ml Dexamethasone, Manual therapy, and Re-evaluation.  PLAN FOR NEXT SESSION: Complete initial eval assessment including LE ROM, flexibility and strength assessment; review gait training with SPC in L hand; create initial HEP   , PT 06/26/2022, 7:49 PM

## 2022-06-26 ENCOUNTER — Encounter: Payer: Self-pay | Admitting: Physical Therapy

## 2022-06-26 ENCOUNTER — Other Ambulatory Visit: Payer: Self-pay

## 2022-06-26 ENCOUNTER — Ambulatory Visit: Payer: PPO | Attending: Orthopedic Surgery | Admitting: Physical Therapy

## 2022-06-26 DIAGNOSIS — M5416 Radiculopathy, lumbar region: Secondary | ICD-10-CM

## 2022-06-26 DIAGNOSIS — M5459 Other low back pain: Secondary | ICD-10-CM

## 2022-06-26 DIAGNOSIS — M6281 Muscle weakness (generalized): Secondary | ICD-10-CM | POA: Diagnosis present

## 2022-06-26 DIAGNOSIS — R262 Difficulty in walking, not elsewhere classified: Secondary | ICD-10-CM | POA: Diagnosis present

## 2022-06-26 DIAGNOSIS — M25551 Pain in right hip: Secondary | ICD-10-CM | POA: Diagnosis not present

## 2022-06-26 DIAGNOSIS — R2689 Other abnormalities of gait and mobility: Secondary | ICD-10-CM

## 2022-06-26 NOTE — Therapy (Signed)
OUTPATIENT PHYSICAL THERAPY THORACOLUMBAR AND LOWER EXTREMITY EVALUATION (continued) & TREATMENT   Patient Name: Shelby Robinson MRN: [id number] DOB:Dec 19, 1945, 76 y.o., female Today's Date: 06/27/2022   PT End of Session - 06/27/22 0802     Visit Number 2    Date for PT Re-Evaluation 08/21/22    Authorization Type [employer]    Progress Note Due on Visit 10    PT Start Time 0802    PT Stop Time 0918    PT Time Calculation (min) 76 min    Activity Tolerance Patient tolerated treatment well    Behavior During Therapy WFL for tasks assessed/performed              Past Medical History:  Diagnosis Date   B12 deficiency    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    Constipation    Diabetes mellitus without complication (HCC)    GERD (gastroesophageal reflux disease)    Hypertension    Hyperthyroidism    Lumbar spondylosis    Osteoarthritis of lumbar spine    Synovitis of hip    Tarlov cyst    Past Surgical History:  Procedure Laterality Date   cataract     ELBOW SURGERY Right    HIP SURGERY     Bilateral THR   TOTAL HIP ARTHROPLASTY     VAGINAL HYSTERECTOMY     Patient Active Problem List   Diagnosis Date Noted   Bruit 11/23/2021   Fall 11/23/2021   Dyslipidemia 09/17/2021   Educated about COVID-19 virus infection 09/22/2020   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   Right hip pain 02/04/2018   Hypertension 02/04/2018   Amnesia 02/04/2018   Transient amnesia 02/03/2018   Aortic insufficiency 10/01/2016   Mitral regurgitation 10/01/2016    PCP: [name], MD  REFERRING PROVIDER: Ollen Gross, MD  REFERRING DIAG:  M25.551 (ICD-10-CM) - Pain in right hip  M54.50 (ICD-10-CM) - Low back pain    RATIONALE FOR EVALUATION AND TREATMENT: Rehabilitation  THERAPY DIAG:  Pain in right hip  Other low back pain  Radiculopathy, lumbar region  Difficulty in walking, not elsewhere classified  Other abnormalities of gait and  mobility  Muscle weakness (generalized)  ONSET DATE: 2019  NEXT MD VISIT: TBD   SUBJECTIVE:                                                                                                                                                                                           SUBJECTIVE STATEMENT: Pt reports more pain along her incisional scar this morning, radiating into her groin area.   PAIN:  Are you having pain? Yes: NPRS scale:  7 /10 Pain location: R>L low back & down R LE to lower leg Pain description: stabbing Aggravating factors: rolling over in bed, transitional motions, bending, prolonged walking Relieving factors: Salon pas patches, heating pad, cabbage leaves  Are you having pain? Yes: NPRS scale:  7/10 Pain location: lateral R hip Pain description: sore, bruised Aggravating factors: rolling over of R hip in bed, leaning to R in sitting, vacuuming Relieving factors: Salon pas patch; Epsom salt bath   PERTINENT HISTORY:  B THA - L THA 2014, R THA 2016, R THA revision 2019; chronic R hip pain, synovitis of hip, lumbar spondylosis/OA, DM-II, HTN, GERD, IBS, chronic diarrhea, bladder and rectal prolapse, hyperthyroidism, aortic insufficieny, aortic and mitral valve disorder, mitral regurgitation, neuropathy, RLS, h/o transient global amnesia  PRECAUTIONS: None  WEIGHT BEARING RESTRICTIONS No  FALLS:  Has patient fallen in last 6 months?  None in past 6 months but 2 bad falls in the past few years  LIVING ENVIRONMENT: Lives with: lives with their spouse Lives in: House/apartment Stairs: Yes: Internal: 14 steps; on right going up and on left going up and External: 1 steps; none and ramp available  - mainly stays on main level of home Has following equipment at home: Single point cane, Walker - 4 wheeled, Grab bars, Ramped entry, and 3-wheel RW  OCCUPATION: Reitired  PLOF: Independent, Needs assistance with homemaking, and Leisure: "[employer]", does  exercises in bed before she gets up in the morning  PATIENT GOALS: "Walk better"   OBJECTIVE:   DIAGNOSTIC FINDINGS:  MRI R hip - 02/05/18: Findings compatible with mild synovitis about the patient's right hip arthroplasty. Atrophy of the adductor brevis and magnus muscles bilaterally is worse on the left. Mild edema in the right adductor muscles is likely related to atrophy. No muscle tear is identified. Trace amount of fluid in the right trochanteric bursa compatible with bursitis. MRI Lumbar spine - 02/05/18: Some increase in the size of a down turning right paracentral protrusion at L4-5. The disc impinges on the descending right L5 root. There is mild to moderate central canal stenosis overall at L4-5.     No change in a shallow disc bulge and small down turning right paracentral protrusion at L3-4. Moderate central canal stenosis and narrowing in the subarticular recesses appear unchanged.    No change in moderate central canal stenosis and narrowing in the subarticular recesses at L2-3 where there is a disc bulge and endplate spur.      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   PATIENT SURVEYS:  FOTO Hip = 44; predicted = 48  SCREENING FOR RED FLAGS: Bowel or bladder incontinence: Yes: h/o prolapsed bladder and rectum Spinal tumors: No Cauda equina syndrome: No Compression fracture: No Abdominal aneurysm: No  COGNITION:  Overall cognitive status: Within functional limits for tasks assessed     SENSATION:  Numb along R hip incisional scar; intermittent tingling down R LE    MUSCLE LENGTH:  Hamstrings: mild tight R>L ITB: mod tight R>L Piriformis: mod tight R>L Hip flexors: mod tight R Quads: mild/mod tight R>L Heelcord: WFL  POSTURE:  Symmetrical hip height and leg length but slight weight shift left   PALPATION: Decreased R hip incisional scar mobility with TTP; Increased muscle tension and TTP in R>L lower lumbar  paraspinals and R>L glutes  LUMBAR ROM:   Active  A/PROM  Eval - 06/26/22  Flexion  Fingertips to ankles  Extension 25% limited  Right lateral flexion Hand to mid lower leg  Left lateral flexion Hand to lateral knee  Right rotation 25% limited   Left rotation 25% limited - pain   (Blank rows = not tested)  LOWER EXTREMITY ROM:     WFL with exceptions of limited hip IR/ER R>L  LOWER EXTREMITY MMT:    MMT Right eval Left eval  Hip flexion 4- * 4-  Hip extension 2+ 3+  Hip abduction 3+ 4-  Hip adduction 3- 4  Hip internal rotation 4- 4-  Hip external rotation 3- 3-  Knee flexion 4+ 5  Knee extension 4+ 5  Ankle dorsiflexion 4- 4  Ankle plantarflexion 4- 8 SL HR 4 11 SL HR  Ankle inversion    Ankle eversion     (Blank rows = not tested) *-pain with resistance  LUMBAR SPECIAL TESTS:  Straight leg raise test: + on R and FABER test: + on R     FUNCTIONAL TESTS: (completed w/o SPC) 5 times sit to stand: 19.06 sec; >15 sec indicates risk for recurrent falls Timed up and go (TUG): 15.75 sec; >13.5 sec indicates high fall risk 10 meter walk test: 11.69 sec - 2.81 ft sec (community ambulator)  Functional gait assessment: 15/30; < 19 = high risk fall      GAIT: Distance walked: 2 x 50 ft Assistive device utilized: Single point cane Level of assistance: SBA/supervision Gait pattern: step through pattern, decreased stance time- Right, decreased stride length, and antalgic on R with slight L Trendelenburg drop during R stance Comments: Cues necessary for proper sequencing of SPC using cane in L hand to offset issues with R hip Stairs: 14 steps with alternating pattern with single R rail support - increased effort noted with LE lift and rise to next step on ascent and slight substitution with lateral pelvic/hip drop with fwd rotation on descent   TODAY'S TREATMENT   06/27/22  Completion of eval (see above for findings): LE ROM and flexibility assessment LE MMT Lumbar  special tests Further gait assessment Stair assessment Functional tests: 5xSTS, TUG, , FGA   THERAPEUTIC EXERCISE: Instruction in initial HEP (see below) to improve flexibility, strength and mobility.  Verbal and tactile cues throughout for technique. Hooklying HS with strap R x 30 sec  Supine ITB stretch cross body with strap R x 30 sec  Supine frog leg/butterfly hip B adductor stretch x 30 sec  Hooklying KTOS piriformis stretch x 30 sec  Modified Thomas hip flexor stretch with R foot propped on PT's foot x 30 sec (pt instructed to use something such as step-stool or shoe box for support if unable to reach floor at home)  Hooklying LTR  5 x 10-30 sec   06/26/22 Eval only   PATIENT EDUCATION:  Education details: PT eval findings, anticipated POC, and initial HEP Person educated: Patient Education method: Explanation, Demonstration, Tactile cues, Verbal cues, Handouts, and instruction in online access to [email or web address] Education comprehension: verbalized understanding, returned demonstration, verbal cues required, tactile cues required, and needs further education   HOME EXERCISE PROGRAM: Access Code: [email or web address] URL: [email or web address] Date: 06/27/2022 Prepared by: Glenetta Hew  Exercises - Hooklying Hamstring Stretch with Strap  - 2-3 x daily - 7 x weekly - 3 reps - 30 sec hold - Supine ITB Stretch with Strap  - 2-3 x daily - 7 x weekly - 3 reps - 30 sec hold - Supine Hip Adductor Stretch  - 2-3 x  daily - 7 x weekly - 3 reps - 30 sec hold - Supine Piriformis Stretch with Foot on Ground  - 2-3 x daily - 7 x weekly - 3 reps - 30 sec hold - Modified Thomas Stretch  - 2-3 x daily - 7 x weekly - 3 reps - 30 sec hold - Supine Lower Trunk Rotation  - 2-3 x daily - 7 x weekly - 5 reps - 10 sec hold  ASSESSMENT:  CLINICAL IMPRESSION: Aneyah reports she has been practicing walking with the [facility] in her L hand to offer support for R LE but still finds it somewhat awkward.  Initial eval assessment completed with further deficits noted with limited proximal LE/lumbopelvic flexibility and LE weakness as anticipated, and altered gait pattern with slight L Trendelenburg drop noted as well as altered gait pattern during stair negotiation. Standardized balance testing revealing high or recurrent fall risk across all tests. Goals updated to reflect potential for improvement in these areas. Initial HEP created targeting proximal LE flexibility to start in efforts to reduce muscle tension and improve flexibility to allow for increased ease of movement and prepare for introduction of strengthening exercises next visit.   OBJECTIVE IMPAIRMENTS: Abnormal gait, decreased activity tolerance, decreased balance, decreased endurance, decreased knowledge of condition, decreased knowledge of use of DME, decreased mobility, difficulty walking, decreased ROM, decreased strength, decreased safety awareness, increased fascial restrictions, impaired perceived functional ability, increased muscle spasms, impaired flexibility, impaired sensation, improper body mechanics, postural dysfunction, and pain.   ACTIVITY LIMITATIONS: carrying, lifting, bending, standing, squatting, sleeping, stairs, transfers, bed mobility, and locomotion level  PARTICIPATION LIMITATIONS: meal prep, cleaning, laundry, driving, shopping, community activity, and yard work  PERSONAL FACTORS: Age, Fitness, Past/current experiences, Time since onset of injury/illness/exacerbation, and 3+ comorbidities: B THA - L THA 2014, R THA 2016, R THA revision 2019; chronic R hip pain, synovitis of hip, lumbar spondylosis/OA, DM-II, HTN, GERD, IBS, chronic diarrhea, bladder and rectal prolapse, hyperthyroidism, aortic insufficieny, aortic and mitral valve disorder, mitral regurgitation, neuropathy, RLS, h/o transient global amnesia  are also affecting patient's functional outcome.   REHAB POTENTIAL: Good  CLINICAL DECISION MAKING:  Evolving/moderate complexity  EVALUATION COMPLEXITY: Moderate   GOALS: Goals reviewed with patient? Yes  SHORT TERM GOALS: Target date: 07/24/2022   Patient will be independent with initial HEP.  Baseline:  Goal status: IN PROGRESS  2.  Patient will report centralization of radicular symptoms.  Baseline:  Goal status: IN PROGRESS  3.  Patient will verbalize understanding of fall prevention measures in home to reduce risk for falls Baseline:  Goal status: INITIAL  4.  Patient will demonstrate decreased TUG time to </= 13.5 sec to decrease risk for falls with transitional mobility Baseline: 15.75 sec Goal status: INITIAL  LONG TERM GOALS: Target date: 08/21/2022    Patient will be independent with advanced/ongoing HEP to improve outcomes and carryover.  Baseline:  Goal status: IN PROGRESS  2.  Patient will report 50-75% improvement in low back pain to improve QOL.  Baseline:  Goal status: IN PROGRESS  3.  Patient will report at least 50-75% improvement in R hip pain to improve QOL. Baseline:  Goal status: IN PROGRESS  4.  Patient to demonstrate ability to achieve and maintain good spinal alignment/posturing and body mechanics needed for daily activities. Baseline:  Goal status: IN PROGRESS  5.  Patient will demonstrate full pain free lumbar ROM to perform ADLs.   Baseline:  Goal status: IN PROGRESS  6.  Patient will  demonstrate improved B LE strength to >/= 4+/5 for improved stability and ease of mobility . Baseline:  Goal status: IN PROGRESS  7.  Patient will report 48 on hip FOTO to demonstrate improved functional ability.  Baseline: 44 Goal status: IN PROGRESS   8.  Patient will tolerate 20-30 min of standing and/or walking to perform chores, cooking or grocery shopping. Baseline:  Goal status: IN PROGRESS  9.  Patient will demonstrate at least 19/30 on FGA to decrease risk of falls. Baseline: 15/30 Goal status: REVISED  10.  Patient will improve 5x STS  time to </= 15 seconds to demonstrate improved functional strength and transfer efficiency  Baseline: 19.06 sec Goal status: INITIAL    PLAN: PT FREQUENCY: 2x/week  PT DURATION: 8 weeks  PLANNED INTERVENTIONS: Therapeutic exercises, Therapeutic activity, Neuromuscular re-education, Balance training, Gait training, Patient/Family education, Joint mobilization, Stair training, DME instructions, Dry Needling, Electrical stimulation, Spinal mobilization, Cryotherapy, Moist heat, scar mobilization, Taping, Traction, Ultrasound, Ionotophoresis 4mg /ml Dexamethasone, Manual therapy, and Re-evaluation.  PLAN FOR NEXT SESSION:  Review initial HEP with modifications as indicated; progress lumbopelvic flexibility as tolerated; initiate lumbopelvic strengthening; fall risk prevention education; review gait training with SPC in L hand PRN; MT +/- DN to address abnormal muscle tension/tightness and pain in B lumbar paraspinals and glutes; modalities PRN for pain   Marry Guan, PT 06/27/2022, 10:05 AM

## 2022-06-27 ENCOUNTER — Ambulatory Visit: Payer: PPO | Admitting: Physical Therapy

## 2022-06-27 ENCOUNTER — Encounter: Payer: Self-pay | Admitting: Physical Therapy

## 2022-06-27 DIAGNOSIS — M6281 Muscle weakness (generalized): Secondary | ICD-10-CM

## 2022-06-27 DIAGNOSIS — M5416 Radiculopathy, lumbar region: Secondary | ICD-10-CM

## 2022-06-27 DIAGNOSIS — M25551 Pain in right hip: Secondary | ICD-10-CM | POA: Diagnosis not present

## 2022-06-27 DIAGNOSIS — R262 Difficulty in walking, not elsewhere classified: Secondary | ICD-10-CM

## 2022-06-27 DIAGNOSIS — M5459 Other low back pain: Secondary | ICD-10-CM

## 2022-06-27 DIAGNOSIS — R2689 Other abnormalities of gait and mobility: Secondary | ICD-10-CM

## 2022-06-28 ENCOUNTER — Encounter: Payer: Medicare Other | Admitting: Physical Therapy

## 2022-07-03 ENCOUNTER — Ambulatory Visit: Payer: PPO

## 2022-07-03 DIAGNOSIS — M5416 Radiculopathy, lumbar region: Secondary | ICD-10-CM

## 2022-07-03 DIAGNOSIS — R2689 Other abnormalities of gait and mobility: Secondary | ICD-10-CM

## 2022-07-03 DIAGNOSIS — R262 Difficulty in walking, not elsewhere classified: Secondary | ICD-10-CM

## 2022-07-03 DIAGNOSIS — M25551 Pain in right hip: Secondary | ICD-10-CM | POA: Diagnosis not present

## 2022-07-03 DIAGNOSIS — M5459 Other low back pain: Secondary | ICD-10-CM

## 2022-07-03 DIAGNOSIS — M6281 Muscle weakness (generalized): Secondary | ICD-10-CM

## 2022-07-03 NOTE — Therapy (Signed)
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    Constipation    Diabetes mellitus without complication (HCC)    GERD (gastroesophageal reflux disease)    Hypertension    Hyperthyroidism    Lumbar spondylosis    Osteoarthritis of lumbar spine    Synovitis of hip    Tarlov cyst    Past Surgical History:  Procedure Laterality Date   cataract     ELBOW SURGERY Right    HIP SURGERY     Bilateral THR   TOTAL HIP ARTHROPLASTY     VAGINAL HYSTERECTOMY     Patient Active Problem List   Diagnosis Date Noted   Bruit 11/23/2021   Fall 11/23/2021   Dyslipidemia 09/17/2021   Educated about COVID-19 virus infection 09/22/2020   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   Right hip pain 02/04/2018   Hypertension 02/04/2018   Amnesia 02/04/2018   Transient amnesia 02/03/2018   Aortic insufficiency 10/01/2016   Mitral regurgitation 10/01/2016    PCP: [name], MD  REFERRING PROVIDER: Ollen Gross, MD  REFERRING DIAG:  M25.551 (ICD-10-CM) - Pain in right hip  M54.50 (ICD-10-CM) - Low back pain    RATIONALE FOR EVALUATION AND TREATMENT: Rehabilitation  THERAPY DIAG:  Pain in right hip  Other low back pain  Radiculopathy, lumbar region  Difficulty in walking, not elsewhere classified  Other abnormalities of gait and mobility  Muscle weakness (generalized)  ONSET DATE:  2019  NEXT MD VISIT: TBD   SUBJECTIVE:                                                                                                                                                                                           SUBJECTIVE STATEMENT: Pt reports pain along back and R hip joint   PAIN:  Are you having pain? Yes: NPRS scale: 8 /10 Pain location:  R>L low back & down R LE to lower leg Pain description: stabbing Aggravating factors: rolling over in bed, transitional motions, bending, prolonged walking Relieving factors: Salon pas patches, heating pad, cabbage leaves  Are you having pain? Yes: NPRS scale:  7/10 Pain location: lateral R hip Pain description: sore, bruised Aggravating factors: rolling over of R hip in bed, leaning to R in sitting, vacuuming Relieving factors: Salon pas patch; Epsom salt bath   PERTINENT HISTORY:  B THA - L THA 2014, R THA 2016, R THA revision 2019; chronic R hip pain, synovitis of hip, lumbar spondylosis/OA, DM-II, HTN, GERD, IBS, chronic diarrhea, bladder and rectal prolapse, hyperthyroidism, aortic insufficieny, aortic and mitral valve disorder, mitral regurgitation, neuropathy, RLS, h/o transient global amnesia  PRECAUTIONS: None  WEIGHT BEARING RESTRICTIONS No  FALLS:  Has patient fallen in last 6 months?  None in past 6 months but 2 bad falls in the past few years  LIVING ENVIRONMENT: Lives with: lives with their spouse Lives in: House/apartment Stairs: Yes: Internal: 14 steps; on right going up and on left going up and External: 1 steps; none and ramp available  - mainly stays on main level of home Has following equipment at home: Single point cane, Walker - 4 wheeled, Grab bars, Ramped entry, and 3-wheel RW  OCCUPATION: Reitired  PLOF: Independent, Needs assistance with homemaking, and Leisure: "[employer]", does exercises in bed before she gets up in the morning  PATIENT GOALS: "Walk better"   OBJECTIVE:   DIAGNOSTIC  FINDINGS:  MRI R hip - 02/05/18: Findings compatible with mild synovitis about the patient's right hip arthroplasty. Atrophy of the adductor brevis and magnus muscles bilaterally is worse on the left. Mild edema in the right adductor muscles is likely related to atrophy. No muscle tear is identified. Trace amount of fluid in the right trochanteric bursa compatible with bursitis. MRI Lumbar spine - 02/05/18: Some increase in the size of a down turning right paracentral protrusion at L4-5. The disc impinges on the descending right L5 root. There is mild to moderate central canal stenosis overall at L4-5.     No change in a shallow disc bulge and small down turning right paracentral protrusion at L3-4. Moderate central canal stenosis and narrowing in the subarticular recesses appear unchanged.    No change in moderate central canal stenosis and narrowing in the subarticular recesses at L2-3 where there is a disc bulge and endplate spur.      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   PATIENT SURVEYS:  FOTO Hip = 44; predicted = 48  SCREENING FOR RED FLAGS: Bowel or bladder incontinence: Yes: h/o prolapsed bladder and rectum Spinal tumors: No Cauda equina syndrome: No Compression fracture: No Abdominal aneurysm: No  COGNITION:  Overall cognitive status: Within functional limits for tasks assessed     SENSATION:  Numb along R hip incisional scar; intermittent tingling down R LE    MUSCLE LENGTH:  Hamstrings: mild tight R>L ITB: mod tight R>L Piriformis: mod tight R>L Hip flexors: mod tight R Quads: mild/mod tight R>L Heelcord: WFL  POSTURE:  Symmetrical hip height and leg length but slight weight shift left   PALPATION: Decreased R hip incisional scar mobility with TTP; Increased muscle tension and TTP in R>L lower lumbar paraspinals and R>L glutes  LUMBAR ROM:   Active  A/PROM  Eval - 06/26/22  Flexion Fingertips to ankles   Extension 25% limited  Right lateral flexion Hand  to mid lower leg  Left lateral flexion Hand to lateral knee  Right rotation 25% limited   Left rotation 25% limited - pain   (Blank rows = not tested)  LOWER EXTREMITY ROM:     WFL with exceptions of limited hip IR/ER R>L  LOWER EXTREMITY MMT:    MMT Right eval Left eval  Hip flexion 4- * 4-  Hip extension 2+ 3+  Hip abduction 3+ 4-  Hip adduction 3- 4  Hip internal rotation 4- 4-  Hip external rotation 3- 3-  Knee flexion 4+ 5  Knee extension 4+ 5  Ankle dorsiflexion 4- 4  Ankle plantarflexion 4- 8 SL HR 4 11 SL HR  Ankle inversion    Ankle eversion     (Blank rows = not tested) *-pain with resistance  LUMBAR SPECIAL TESTS:  Straight leg raise test: + on R and FABER test: + on R     FUNCTIONAL TESTS: (completed w/o SPC) 5 times sit to stand: 19.06 sec; >15 sec indicates risk for recurrent falls Timed up and go (TUG): 15.75 sec; >13.5 sec indicates high fall risk 10 meter walk test: 11.69 sec - 2.81 ft sec (community ambulator)  Functional gait assessment: 15/30; < 19 = high risk fall      GAIT: Distance walked: 2 x 50 ft Assistive device utilized: Single point cane Level of assistance: SBA/supervision Gait pattern: step through pattern, decreased stance time- Right, decreased stride length, and antalgic on R with slight L Trendelenburg drop during R stance Comments: Cues necessary for proper sequencing of SPC using cane in L hand to offset issues with R hip Stairs: 14 steps with alternating pattern with single R rail support - increased effort noted with LE lift and rise to next step on ascent and slight substitution with lateral pelvic/hip drop with fwd rotation on descent   TODAY'S TREATMENT  07/02/22 Therapeutic Exercise: Bike L2x51min Seated march 15x each leg Seated LAQ 10 reps w/ 3 sec hold bil Hooklying pelvic tilts 10x3" Bridges with TrA brace 10x - moderately challenging Hooklying clams with yellow  TB 10x3" Hooklying hip ADD ball squeeze 10x3" Standing heel raise 10x  06/27/22  Completion of eval (see above for findings): LE ROM and flexibility assessment LE MMT Lumbar special tests Further gait assessment Stair assessment Functional tests: 5xSTS, TUG, , FGA   THERAPEUTIC EXERCISE: Instruction in initial HEP (see below) to improve flexibility, strength and mobility.  Verbal and tactile cues throughout for technique. Hooklying HS with strap R x 30 sec  Supine ITB stretch cross body with strap R x 30 sec  Supine frog leg/butterfly hip B adductor stretch x 30 sec  Hooklying KTOS piriformis stretch x 30 sec  Modified Thomas hip flexor stretch with R foot propped on PT's foot x 30 sec (pt instructed to use something such as step-stool or shoe box for support if unable to reach floor at home)  Hooklying LTR  5 x 10-30 sec   06/26/22 Eval only   PATIENT EDUCATION:  Education details: PT eval findings, anticipated POC, and initial HEP Person educated: Patient Education method: Explanation, Demonstration, Tactile cues, Verbal cues, Handouts, and instruction in online access to [email or web address] Education comprehension: verbalized understanding, returned demonstration, verbal cues required, tactile cues required, and needs further education   HOME EXERCISE PROGRAM: Access Code: [email or web address] URL: [email or web address] Date: 07/03/2022 Prepared by: Verta Ellen  Exercises - Hooklying Hamstring Stretch with Strap  - 2-3 x daily - 7 x weekly - 3 reps -  30 sec hold - Supine ITB Stretch with Strap  - 2-3 x daily - 7 x weekly - 3 reps - 30 sec hold - Supine Hip Adductor Stretch  - 2-3 x daily - 7 x weekly - 3 reps - 30 sec hold - Supine Piriformis Stretch with Foot on Ground  - 2-3 x daily - 7 x weekly - 3 reps - 30 sec hold - Modified Thomas Stretch  - 2-3 x daily - 7 x weekly - 3 reps - 30 sec hold - Supine Lower Trunk Rotation  - 2-3 x daily - 7 x weekly - 5 reps - 10-30  sec hold - Supine Posterior Pelvic Tilt  - 1 x daily - 7 x weekly - 2 sets - 10 reps - 3 sec hold - Supine Bridge  - 1 x daily - 7 x weekly - 2 sets - 10 reps - Supine Hip Adduction Isometric with Ball  - 1 x daily - 7 x weekly - 2 sets - 10 reps - 3 sec hold - Hooklying Clamshell with Resistance  - 1 x daily - 7 x weekly - 2 sets - 10 reps - 3 sec hold  ASSESSMENT:  CLINICAL IMPRESSION: Today's session focused on progression of HEP to strengthening to improve muscle recruitment in B hips. Pt had difficulty with bridges and noted pain with standing hip ABD on R leg. She showed good carryover from instruction given with exercises. Would benefit from continued progression of hip strengthening and core stabilization.   OBJECTIVE IMPAIRMENTS: Abnormal gait, decreased activity tolerance, decreased balance, decreased endurance, decreased knowledge of condition, decreased knowledge of use of DME, decreased mobility, difficulty walking, decreased ROM, decreased strength, decreased safety awareness, increased fascial restrictions, impaired perceived functional ability, increased muscle spasms, impaired flexibility, impaired sensation, improper body mechanics, postural dysfunction, and pain.   ACTIVITY LIMITATIONS: carrying, lifting, bending, standing, squatting, sleeping, stairs, transfers, bed mobility, and locomotion level  PARTICIPATION LIMITATIONS: meal prep, cleaning, laundry, driving, shopping, community activity, and yard work  PERSONAL FACTORS: Age, Fitness, Past/current experiences, Time since onset of injury/illness/exacerbation, and 3+ comorbidities: B THA - L THA 2014, R THA 2016, R THA revision 2019; chronic R hip pain, synovitis of hip, lumbar spondylosis/OA, DM-II, HTN, GERD, IBS, chronic diarrhea, bladder and rectal prolapse, hyperthyroidism, aortic insufficieny, aortic and mitral valve disorder, mitral regurgitation, neuropathy, RLS, h/o transient global amnesia  are also affecting patient's  functional outcome.   REHAB POTENTIAL: Good  CLINICAL DECISION MAKING: Evolving/moderate complexity  EVALUATION COMPLEXITY: Moderate   GOALS: Goals reviewed with patient? Yes  SHORT TERM GOALS: Target date: 07/24/2022   Patient will be independent with initial HEP.  Baseline:  Goal status: IN PROGRESS  2.  Patient will report centralization of radicular symptoms.  Baseline:  Goal status: IN PROGRESS  3.  Patient will verbalize understanding of fall prevention measures in home to reduce risk for falls Baseline:  Goal status: INITIAL  4.  Patient will demonstrate decreased TUG time to </= 13.5 sec to decrease risk for falls with transitional mobility Baseline: 15.75 sec Goal status: INITIAL  LONG TERM GOALS: Target date: 08/21/2022    Patient will be independent with advanced/ongoing HEP to improve outcomes and carryover.  Baseline:  Goal status: IN PROGRESS  2.  Patient will report 50-75% improvement in low back pain to improve QOL.  Baseline:  Goal status: IN PROGRESS  3.  Patient will report at least 50-75% improvement in R hip pain to improve QOL. Baseline:  Goal status: IN PROGRESS  4.  Patient to demonstrate ability to achieve and maintain good spinal alignment/posturing and body mechanics needed for daily activities. Baseline:  Goal status: IN PROGRESS  5.  Patient will demonstrate full pain free lumbar ROM to perform ADLs.   Baseline:  Goal status: IN PROGRESS  6.  Patient will demonstrate improved B LE strength to >/= 4+/5 for improved stability and ease of mobility . Baseline:  Goal status: IN PROGRESS  7.  Patient will report 48 on hip FOTO to demonstrate improved functional ability.  Baseline: 44 Goal status: IN PROGRESS   8.  Patient will tolerate 20-30 min of standing and/or walking to perform chores, cooking or grocery shopping. Baseline:  Goal status: IN PROGRESS  9.  Patient will demonstrate at least 19/30 on FGA to decrease risk of  falls. Baseline: 15/30 Goal status: REVISED  10.  Patient will improve 5x STS time to </= 15 seconds to demonstrate improved functional strength and transfer efficiency  Baseline: 19.06 sec Goal status: INITIAL    PLAN: PT FREQUENCY: 2x/week  PT DURATION: 8 weeks  PLANNED INTERVENTIONS: Therapeutic exercises, Therapeutic activity, Neuromuscular re-education, Balance training, Gait training, Patient/Family education, Joint mobilization, Stair training, DME instructions, Dry Needling, Electrical stimulation, Spinal mobilization, Cryotherapy, Moist heat, scar mobilization, Taping, Traction, Ultrasound, Ionotophoresis 4mg /ml Dexamethasone, Manual therapy, and Re-evaluation.  PLAN FOR NEXT SESSION:  Review progressed HEP with modifications as indicated; progress lumbopelvic flexibility as tolerated; initiate lumbopelvic strengthening; fall risk prevention education; review gait training with SPC in L hand PRN; MT +/- DN to address abnormal muscle tension/tightness and pain in B lumbar paraspinals and glutes; modalities PRN for pain   , PTA 07/03/2022, 10:31 AM

## 2022-07-06 ENCOUNTER — Encounter: Payer: Self-pay | Admitting: Physical Therapy

## 2022-07-06 ENCOUNTER — Ambulatory Visit: Payer: PPO | Admitting: Physical Therapy

## 2022-07-06 DIAGNOSIS — M6281 Muscle weakness (generalized): Secondary | ICD-10-CM

## 2022-07-06 DIAGNOSIS — M25551 Pain in right hip: Secondary | ICD-10-CM | POA: Diagnosis not present

## 2022-07-06 DIAGNOSIS — M5416 Radiculopathy, lumbar region: Secondary | ICD-10-CM

## 2022-07-06 DIAGNOSIS — R2689 Other abnormalities of gait and mobility: Secondary | ICD-10-CM

## 2022-07-06 DIAGNOSIS — R262 Difficulty in walking, not elsewhere classified: Secondary | ICD-10-CM

## 2022-07-06 DIAGNOSIS — M5459 Other low back pain: Secondary | ICD-10-CM

## 2022-07-06 NOTE — Therapy (Signed)
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    Constipation    Diabetes mellitus without complication (HCC)    GERD (gastroesophageal reflux disease)    Hypertension    Hyperthyroidism    Lumbar spondylosis    Osteoarthritis of lumbar spine    Synovitis of hip    Tarlov cyst    Past Surgical History:  Procedure Laterality Date   cataract     ELBOW SURGERY Right    HIP SURGERY     Bilateral THR   TOTAL HIP ARTHROPLASTY     VAGINAL HYSTERECTOMY     Patient Active Problem List   Diagnosis Date Noted   Bruit 11/23/2021   Fall 11/23/2021   Dyslipidemia 09/17/2021   Educated about COVID-19 virus infection 09/22/2020   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   Right hip pain 02/04/2018   Hypertension 02/04/2018   Amnesia 02/04/2018   Transient amnesia 02/03/2018   Aortic insufficiency 10/01/2016   Mitral regurgitation 10/01/2016    PCP: [name], MD  REFERRING PROVIDER: Ollen Gross, MD  REFERRING DIAG:  M25.551 (ICD-10-CM) - Pain in right hip  M54.50 (ICD-10-CM) - Low back pain    RATIONALE FOR EVALUATION AND TREATMENT: Rehabilitation  THERAPY DIAG:  Pain in right hip  Other low back pain  Radiculopathy, lumbar region  Difficulty in walking, not elsewhere classified  Other abnormalities of gait and mobility  Muscle weakness (generalized)  ONSET DATE:  2019  NEXT MD VISIT: TBD   SUBJECTIVE:                                                                                                                                                                                           SUBJECTIVE STATEMENT: Pt reports no pain along back and R hip joint but states she tweaked her R knee coming down the stairs - knee  has been a little swollen but wearing her knee brace has helped. She was in the ED for a bad UTI earlier this week and they changed her meds. Back pain has been better since.  PAIN:  Are you having pain? Yes: NPRS scale: 3-4/10 Pain location: R knee Pain description: over-stretched Aggravating factors: "tweaked" coming down the stairs Relieving factors: knee brace   PERTINENT HISTORY:  B THA - L THA 2014, R THA 2016, R THA revision 2019; chronic R hip pain, synovitis of hip, lumbar spondylosis/OA, DM-II, HTN, GERD, IBS, chronic diarrhea, bladder and rectal prolapse, hyperthyroidism, aortic insufficieny, aortic and mitral valve disorder, mitral regurgitation, neuropathy, RLS, h/o transient global amnesia  PRECAUTIONS: None  WEIGHT BEARING RESTRICTIONS No  FALLS:  Has patient fallen in last 6 months?  None in past 6 months but 2 bad falls in the past few years  LIVING ENVIRONMENT: Lives with: lives with their spouse Lives in: House/apartment Stairs: Yes: Internal: 14 steps; on right going up and on left going up and External: 1 steps; none and ramp available  - mainly stays on main level of home Has following equipment at home: Single point cane, Walker - 4 wheeled, Grab bars, Ramped entry, and 3-wheel RW  OCCUPATION: Reitired  PLOF: Independent, Needs assistance with homemaking, and Leisure: "[employer]", does exercises in bed before she gets up in the morning  PATIENT GOALS: "Walk better"   OBJECTIVE:   DIAGNOSTIC FINDINGS:  MRI R hip - 02/05/18: Findings compatible with mild synovitis about the patient's right hip  arthroplasty. Atrophy of the adductor brevis and magnus muscles bilaterally is worse on the left. Mild edema in the right adductor muscles is likely related to atrophy. No muscle tear is identified. Trace amount of fluid in the right trochanteric bursa compatible with bursitis. MRI Lumbar spine - 02/05/18: Some increase in the size of a down turning right paracentral protrusion at L4-5. The disc impinges on the descending right L5 root. There is mild to moderate central canal stenosis overall at L4-5.     No change in a shallow disc bulge and small down turning right paracentral protrusion at L3-4. Moderate central canal stenosis and narrowing in the subarticular recesses appear unchanged.    No change in moderate central canal stenosis and narrowing in the subarticular recesses at L2-3 where there is a disc bulge and endplate spur.      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  PATIENT SURVEYS:  FOTO Hip = 44; predicted = 48  SCREENING FOR RED FLAGS: Bowel or bladder incontinence: Yes: h/o prolapsed bladder and rectum Spinal tumors: No Cauda equina syndrome: No Compression fracture: No Abdominal aneurysm: No  COGNITION:  Overall cognitive status: Within functional limits for tasks assessed     SENSATION:  Numb along R hip incisional scar; intermittent tingling down R LE    MUSCLE LENGTH:  Hamstrings: mild tight R>L ITB: mod tight R>L Piriformis: mod tight R>L Hip flexors: mod tight R Quads: mild/mod tight R>L Heelcord: WFL  POSTURE:  Symmetrical hip height and leg length but slight weight shift left   PALPATION: Decreased R hip incisional scar mobility with TTP; Increased muscle tension and TTP in R>L lower lumbar paraspinals and R>L glutes  LUMBAR ROM:   Active  A/PROM  Eval - 06/26/22  Flexion Fingertips to ankles  Extension 25% limited  Right lateral flexion Hand to mid lower leg  Left lateral flexion Hand to lateral  knee  Right rotation 25% limited   Left rotation 25% limited - pain   (Blank rows = not tested)  LOWER EXTREMITY ROM:     WFL with exceptions of limited hip IR/ER R>L  LOWER EXTREMITY MMT:    MMT Right eval Left eval  Hip flexion 4- * 4-  Hip extension 2+ 3+  Hip abduction 3+ 4-  Hip adduction 3- 4  Hip internal rotation 4- 4-  Hip external rotation 3- 3-  Knee flexion 4+ 5  Knee extension 4+ 5  Ankle dorsiflexion 4- 4  Ankle plantarflexion 4- 8 SL HR 4 11 SL HR  Ankle inversion    Ankle eversion     (Blank rows = not tested) *-pain with resistance  LUMBAR SPECIAL TESTS:  Straight leg raise test: + on R and FABER test: + on R     FUNCTIONAL TESTS: (completed w/o SPC) 5 times sit to stand: 19.06 sec; >15 sec indicates risk for recurrent falls Timed up and go (TUG): 15.75 sec; >13.5 sec indicates high fall risk 10 meter walk test: 11.69 sec - 2.81 ft sec (community ambulator)  Functional gait assessment: 15/30; < 19 = high risk fall      GAIT: Distance walked: 2 x 50 ft Assistive device utilized: Single point cane Level of assistance: SBA/supervision Gait pattern: step through pattern, decreased stance time- Right, decreased stride length, and antalgic on R with slight L Trendelenburg drop during R stance Comments: Cues necessary for proper sequencing of SPC using cane in L hand to offset issues with R hip Stairs: 14 steps with alternating pattern with single R rail support - increased effort noted with LE lift and rise to next step on ascent and slight substitution with lateral pelvic/hip drop with fwd rotation on descent   TODAY'S TREATMENT   07/06/22 THERAPEUTIC EXERCISE: to improve flexibility, strength and mobility.  Verbal and tactile cues throughout for technique. Rec bike L2 x 6 min R hooklying KTOS 2 x 30"  GAIT TRAINING: To normalize gait pattern and improve safety with SPC . 120 ft with SPC in L hand - cues for sequencing of cane with R LE advancement,  explaining rationale for use of AD on opposite to maintain reciprocal pattern  MANUAL THERAPY: To promote normalized muscle tension, improved flexibility, and reduced pain. Skilled palpation and monitoring of soft tissue during DN Trigger Point Dry-Needling  Treatment instructions: Expect mild to moderate muscle soreness. Patient verbalized understanding of these instructions and education. Patient Consent Given: Yes Education handout provided: Yes Muscles treated: R glute medius and minimus Electrical stimulation performed: No Parameters: N/A Treatment response/outcome: Twitch Response Elicited and Palpable Increase in Muscle Length STM/DTM and manual TPR to muscles addressed with DN  SELF CARE: Provided instruction in self-STM/TPR to glutes/piriformis using tennis ball on wall   07/02/22 Therapeutic Exercise: Bike L2x41min Seated march 15x each leg Seated LAQ 10 reps w/ 3 sec hold bil Hooklying pelvic tilts 10x3" Bridges with TrA brace 10x - moderately challenging Hooklying clams with yellow TB 10x3" Hooklying hip ADD ball squeeze 10x3" Standing heel raise 10x   06/27/22  Completion of eval (see above for findings): LE ROM and flexibility assessment LE MMT Lumbar special tests Further gait assessment Stair assessment Functional tests: 5xSTS, TUG, , FGA   THERAPEUTIC EXERCISE: Instruction in initial HEP (see below) to improve flexibility, strength and mobility.  Verbal and tactile cues throughout for technique. Hooklying HS with strap R x 30 sec  Supine ITB stretch cross body with  strap R x 30 sec  Supine frog leg/butterfly hip B adductor stretch x 30 sec  Hooklying KTOS piriformis stretch x 30 sec  Modified Thomas hip flexor stretch with R foot propped on PT's foot x 30 sec (pt instructed to use something such as step-stool or shoe box for support if unable to reach floor at home)  Hooklying LTR  5 x 10-30 sec   PATIENT EDUCATION:  Education details: self-STM  techniques to R glutes using tennis ball on wall, role of DN, and DN rational, procedure, outcomes, potential side effects, and recommended post-treatment exercises/activity Person educated: Patient Education method: Explanation, Verbal cues, and Handouts Education comprehension: verbalized understanding   HOME EXERCISE PROGRAM: Access Code: [email or web address] URL: [email or web address] Date: 07/03/2022 Prepared by: Verta EllenBraylin Clark  Exercises - Hooklying Hamstring Stretch with Strap  - 2-3 x daily - 7 x weekly - 3 reps - 30 sec hold - Supine ITB Stretch with Strap  - 2-3 x daily - 7 x weekly - 3 reps - 30 sec hold - Supine Hip Adductor Stretch  - 2-3 x daily - 7 x weekly - 3 reps - 30 sec hold - Supine Piriformis Stretch with Foot on Ground  - 2-3 x daily - 7 x weekly - 3 reps - 30 sec hold - Modified Thomas Stretch  - 2-3 x daily - 7 x weekly - 3 reps - 30 sec hold - Supine Lower Trunk Rotation  - 2-3 x daily - 7 x weekly - 5 reps - 10-30 sec hold - Supine Posterior Pelvic Tilt  - 1 x daily - 7 x weekly - 2 sets - 10 reps - 3 sec hold - Supine Bridge  - 1 x daily - 7 x weekly - 2 sets - 10 reps - Supine Hip Adduction Isometric with Ball  - 1 x daily - 7 x weekly - 2 sets - 10 reps - 3 sec hold - Hooklying Clamshell with Resistance  - 1 x daily - 7 x weekly - 2 sets - 10 reps - 3 sec hold  ASSESSMENT:  CLINICAL IMPRESSION: Shelby Robinson reports new HEP exercises added last visit are going well and she denies need for review today. Back and hip pain seem to be better since her meds were changed to get her UTI under control but she still notes TTP in R buttock. Some relief achieved with initial MT therefore discussed possibility of DN. After explanation of DN rational, procedures, outcomes and potential side effects, patient verbalized consent to DN treatment in conjunction with manual STM/DTM and TPR to reduce TTP/muscle tension. Muscles treated as indicated above. DN produced normal response  with good twitches elicited resulting in palpable reduction in pain/ttp and muscle tension. Pt educated to expect mild to moderate muscle soreness for up to 24-48 hrs and instructed to continue prescribed home exercise program and current activity level with pt verbalizing understanding of theses instructions.   OBJECTIVE IMPAIRMENTS: Abnormal gait, decreased activity tolerance, decreased balance, decreased endurance, decreased knowledge of condition, decreased knowledge of use of DME, decreased mobility, difficulty walking, decreased ROM, decreased strength, decreased safety awareness, increased fascial restrictions, impaired perceived functional ability, increased muscle spasms, impaired flexibility, impaired sensation, improper body mechanics, postural dysfunction, and pain.   ACTIVITY LIMITATIONS: carrying, lifting, bending, standing, squatting, sleeping, stairs, transfers, bed mobility, and locomotion level  PARTICIPATION LIMITATIONS: meal prep, cleaning, laundry, driving, shopping, community activity, and yard work  PERSONAL FACTORS: Age, Fitness, Past/current experiences, Time since onset of injury/illness/exacerbation, and 3+  comorbidities: B THA - L THA 2014, R THA 2016, R THA revision 2019; chronic R hip pain, synovitis of hip, lumbar spondylosis/OA, DM-II, HTN, GERD, IBS, chronic diarrhea, bladder and rectal prolapse, hyperthyroidism, aortic insufficieny, aortic and mitral valve disorder, mitral regurgitation, neuropathy, RLS, h/o transient global amnesia  are also affecting patient's functional outcome.   REHAB POTENTIAL: Good  CLINICAL DECISION MAKING: Evolving/moderate complexity  EVALUATION COMPLEXITY: Moderate   GOALS: Goals reviewed with patient? Yes  SHORT TERM GOALS: Target date: 07/24/2022   Patient will be independent with initial HEP.  Baseline:  Goal status: IN PROGRESS  2.  Patient will report centralization of radicular symptoms.  Baseline:  Goal status: IN  PROGRESS  3.  Patient will verbalize understanding of fall prevention measures in home to reduce risk for falls Baseline:  Goal status: IN PROGRESS  4.  Patient will demonstrate decreased TUG time to </= 13.5 sec to decrease risk for falls with transitional mobility Baseline: 15.75 sec Goal status: IN PROGRESS  LONG TERM GOALS: Target date: 08/21/2022    Patient will be independent with advanced/ongoing HEP to improve outcomes and carryover.  Baseline:  Goal status: IN PROGRESS  2.  Patient will report 50-75% improvement in low back pain to improve QOL.  Baseline:  Goal status: IN PROGRESS  3.  Patient will report at least 50-75% improvement in R hip pain to improve QOL. Baseline:  Goal status: IN PROGRESS  4.  Patient to demonstrate ability to achieve and maintain good spinal alignment/posturing and body mechanics needed for daily activities. Baseline:  Goal status: IN PROGRESS  5.  Patient will demonstrate full pain free lumbar ROM to perform ADLs.   Baseline:  Goal status: IN PROGRESS  6.  Patient will demonstrate improved B LE strength to >/= 4+/5 for improved stability and ease of mobility . Baseline:  Goal status: IN PROGRESS  7.  Patient will report 48 on hip FOTO to demonstrate improved functional ability.  Baseline: 44 Goal status: IN PROGRESS   8.  Patient will tolerate 20-30 min of standing and/or walking to perform chores, cooking or grocery shopping. Baseline:  Goal status: IN PROGRESS  9.  Patient will demonstrate at least 19/30 on FGA to decrease risk of falls. Baseline: 15/30 Goal status: REVISED  10.  Patient will improve 5x STS time to </= 15 seconds to demonstrate improved functional strength and transfer efficiency  Baseline: 19.06 sec Goal status: IN PROGRESS   PLAN: PT FREQUENCY: 2x/week  PT DURATION: 8 weeks  PLANNED INTERVENTIONS: Therapeutic exercises, Therapeutic activity, Neuromuscular re-education, Balance training, Gait training,  Patient/Family education, Joint mobilization, Stair training, DME instructions, Dry Needling, Electrical stimulation, Spinal mobilization, Cryotherapy, Moist heat, scar mobilization, Taping, Traction, Ultrasound, Ionotophoresis 4mg /ml Dexamethasone, Manual therapy, and Re-evaluation.  PLAN FOR NEXT SESSION:  Assess response to DN; fall risk prevention education; progress lumbopelvic flexibility and strengthening as tolerated - review/update HEP as indicated; review gait training with SPC in L hand PRN; MT +/- DN to address abnormal muscle tension/tightness and pain in B lumbar paraspinals and glutes; modalities PRN for pain   , PT 07/06/2022, 12:42 PM

## 2022-07-10 ENCOUNTER — Ambulatory Visit: Payer: PPO

## 2022-07-10 DIAGNOSIS — M25551 Pain in right hip: Secondary | ICD-10-CM | POA: Diagnosis not present

## 2022-07-10 DIAGNOSIS — R2689 Other abnormalities of gait and mobility: Secondary | ICD-10-CM

## 2022-07-10 DIAGNOSIS — M5459 Other low back pain: Secondary | ICD-10-CM

## 2022-07-10 DIAGNOSIS — R262 Difficulty in walking, not elsewhere classified: Secondary | ICD-10-CM

## 2022-07-10 DIAGNOSIS — M5416 Radiculopathy, lumbar region: Secondary | ICD-10-CM

## 2022-07-10 DIAGNOSIS — M6281 Muscle weakness (generalized): Secondary | ICD-10-CM

## 2022-07-10 NOTE — Therapy (Signed)
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    Constipation    Diabetes mellitus without complication (HCC)    GERD (gastroesophageal reflux disease)    Hypertension    Hyperthyroidism    Lumbar spondylosis    Osteoarthritis of lumbar spine    Synovitis of hip    Tarlov cyst    Past Surgical History:  Procedure Laterality Date   cataract     ELBOW SURGERY Right    HIP SURGERY     Bilateral THR   TOTAL HIP ARTHROPLASTY     VAGINAL HYSTERECTOMY     Patient Active Problem List   Diagnosis Date Noted   Bruit 11/23/2021   Fall 11/23/2021   Dyslipidemia 09/17/2021   Educated about COVID-19 virus infection 09/22/2020   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   Right hip pain 02/04/2018   Hypertension 02/04/2018   Amnesia 02/04/2018   Transient amnesia 02/03/2018   Aortic insufficiency 10/01/2016   Mitral regurgitation 10/01/2016    PCP: [name], MD  REFERRING PROVIDER: Ollen Gross, MD  REFERRING DIAG:  M25.551 (ICD-10-CM) - Pain in right hip  M54.50 (ICD-10-CM) - Low back pain    RATIONALE FOR EVALUATION AND TREATMENT: Rehabilitation  THERAPY DIAG:  Pain in right hip  Other low back pain  Radiculopathy, lumbar region  Difficulty in walking, not elsewhere classified  Other abnormalities of gait and mobility  Muscle weakness (generalized)  ONSET DATE:  2019  NEXT MD VISIT: TBD   SUBJECTIVE:                                                                                                                                                                                           SUBJECTIVE STATEMENT: Pt reports walking yesterday 2 hours shopping with daughter she did have some pain last night but it is better this morning.  PAIN:  Are you having pain? Yes: NPRS scale: 4-5/10 Pain location: across the back Pain description: ache Aggravating factors: "tweaked" coming down the stairs Relieving factors: knee brace   PERTINENT HISTORY:  B THA - L THA 2014, R THA 2016, R THA revision 2019; chronic R hip pain, synovitis of hip, lumbar spondylosis/OA, DM-II, HTN, GERD, IBS, chronic diarrhea, bladder and rectal prolapse, hyperthyroidism, aortic insufficieny, aortic and mitral valve disorder, mitral regurgitation, neuropathy, RLS, h/o transient global amnesia  PRECAUTIONS: None  WEIGHT BEARING RESTRICTIONS No  FALLS:  Has patient fallen in last 6 months?  None in past 6 months but 2 bad falls in the past few years  LIVING ENVIRONMENT: Lives with: lives with their spouse Lives in: House/apartment Stairs: Yes: Internal: 14 steps; on right going up and on left going up and External: 1 steps; none and ramp available  - mainly stays on main level of home Has following equipment at home: Single point cane, Walker - 4 wheeled, Grab bars, Ramped entry, and 3-wheel RW  OCCUPATION: Reitired  PLOF: Independent, Needs assistance with homemaking, and Leisure: "[employer] ladies [employer]", does exercises in bed before she gets up in the morning  PATIENT GOALS: "Walk better"   OBJECTIVE:   DIAGNOSTIC FINDINGS:  MRI R hip - 02/05/18: Findings compatible with mild synovitis about the patient's right hip arthroplasty. Atrophy of the adductor brevis and magnus muscles bilaterally is worse on the left. Mild edema in the right adductor muscles is likely related to  atrophy. No muscle tear is identified. Trace amount of fluid in the right trochanteric bursa compatible with bursitis. MRI Lumbar spine - 02/05/18: Some increase in the size of a down turning right paracentral protrusion at L4-5. The disc impinges on the descending right L5 root. There is mild to moderate central canal stenosis overall at L4-5.     No change in a shallow disc bulge and small down turning right paracentral protrusion at L3-4. Moderate central canal stenosis and narrowing in the subarticular recesses appear unchanged.    No change in moderate central canal stenosis and narrowing in the subarticular recesses at L2-3 where there is a disc bulge and endplate spur.      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  PATIENT SURVEYS:  FOTO Hip = 44; predicted = 48  SCREENING FOR RED FLAGS: Bowel or bladder incontinence: Yes: h/o prolapsed bladder and rectum Spinal tumors: No Cauda equina syndrome: No Compression fracture: No Abdominal aneurysm: No  COGNITION:  Overall cognitive status: Within functional limits for tasks assessed     SENSATION:  Numb along R hip incisional scar; intermittent tingling down R LE    MUSCLE LENGTH:  Hamstrings: mild tight R>L ITB: mod tight R>L Piriformis: mod tight R>L Hip flexors: mod tight R Quads: mild/mod tight R>L Heelcord: WFL  POSTURE:  Symmetrical hip height and leg length but slight weight shift left   PALPATION: Decreased R hip incisional scar mobility with TTP; Increased muscle tension and TTP in R>L lower lumbar paraspinals and R>L glutes  LUMBAR ROM:   Active  A/PROM  Eval - 06/26/22  Flexion Fingertips to ankles  Extension 25% limited  Right lateral flexion Hand to mid lower leg  Left lateral flexion Hand to lateral knee  Right rotation 25% limited   Left rotation 25% limited - pain   (Blank rows = not tested)  LOWER EXTREMITY ROM:     WFL with exceptions of limited  hip IR/ER  R>L  LOWER EXTREMITY MMT:    MMT Right eval Left eval  Hip flexion 4- * 4-  Hip extension 2+ 3+  Hip abduction 3+ 4-  Hip adduction 3- 4  Hip internal rotation 4- 4-  Hip external rotation 3- 3-  Knee flexion 4+ 5  Knee extension 4+ 5  Ankle dorsiflexion 4- 4  Ankle plantarflexion 4- 8 SL HR 4 11 SL HR  Ankle inversion    Ankle eversion     (Blank rows = not tested) *-pain with resistance  LUMBAR SPECIAL TESTS:  Straight leg raise test: + on R and FABER test: + on R     FUNCTIONAL TESTS: (completed w/o SPC) 5 times sit to stand: 19.06 sec; >15 sec indicates risk for recurrent falls Timed up and go (TUG): 15.75 sec; >13.5 sec indicates high fall risk 10 meter walk test: 11.69 sec - 2.81 ft sec (community ambulator)  Functional gait assessment: 15/30; < 19 = high risk fall      GAIT: Distance walked: 2 x 50 ft Assistive device utilized: Single point cane Level of assistance: SBA/supervision Gait pattern: step through pattern, decreased stance time- Right, decreased stride length, and antalgic on R with slight L Trendelenburg drop during R stance Comments: Cues necessary for proper sequencing of SPC using cane in L hand to offset issues with R hip Stairs: 14 steps with alternating pattern with single R rail support - increased effort noted with LE lift and rise to next step on ascent and slight substitution with lateral pelvic/hip drop with fwd rotation on descent   TODAY'S TREATMENT  07/10/22 Therapeutic Exercise: Nustep L4x888min Review of fall prevention & safety handout Squats x 10 Standing hip ABD, march, ext x 20 Golfers lifts x 10 bil - decreased ROM required supported on R LE Supine bridge with legs straight x 10 LTR with feet bent over x 10 bil Supine R KTOS stretch x 30 sec hold  07/06/22 THERAPEUTIC EXERCISE: to improve flexibility, strength and mobility.  Verbal and tactile cues throughout for technique. Rec bike L2 x 6 min R hooklying KTOS 2 x  30"  GAIT TRAINING: To normalize gait pattern and improve safety with SPC . 120 ft with SPC in L hand - cues for sequencing of cane with R LE advancement, explaining rationale for use of AD on opposite to maintain reciprocal pattern  MANUAL THERAPY: To promote normalized muscle tension, improved flexibility, and reduced pain. Skilled palpation and monitoring of soft tissue during DN Trigger Point Dry-Needling  Treatment instructions: Expect mild to moderate muscle soreness. Patient verbalized understanding of these instructions and education. Patient Consent Given: Yes Education handout provided: Yes Muscles treated: R glute medius and minimus Electrical stimulation performed: No Parameters: N/A Treatment response/outcome: Twitch Response Elicited and Palpable Increase in Muscle Length STM/DTM and manual TPR to muscles addressed with DN  SELF CARE: Provided instruction in self-STM/TPR to glutes/piriformis using tennis ball on wall   07/02/22 Therapeutic Exercise: Bike L2x826min Seated march 15x each leg Seated LAQ 10 reps w/ 3 sec hold bil Hooklying pelvic tilts 10x3" Bridges with TrA brace 10x - moderately challenging Hooklying clams with yellow TB 10x3" Hooklying hip ADD ball squeeze 10x3" Standing heel raise 10x   06/27/22  Completion of eval (see above for findings): LE ROM and flexibility assessment LE MMT Lumbar special tests Further gait assessment Stair assessment Functional tests: 5xSTS, TUG, 10MWT, FGA   THERAPEUTIC EXERCISE: Instruction in initial HEP (see below) to improve flexibility, strength and  mobility.  Verbal and tactile cues throughout for technique. Hooklying HS with strap R x 30 sec  Supine ITB stretch cross body with strap R x 30 sec  Supine frog leg/butterfly hip B adductor stretch x 30 sec  Hooklying KTOS piriformis stretch x 30 sec  Modified Thomas hip flexor stretch with R foot propped on PT's foot x 30 sec (pt instructed to use something such as  step-stool or shoe box for support if unable to reach floor at home)  Hooklying LTR  5 x 10-30 sec   PATIENT EDUCATION:  Education details: self-STM techniques to R glutes using tennis ball on wall, role of DN, and DN rational, procedure, outcomes, potential side effects, and recommended post-treatment exercises/activity Person educated: Patient Education method: Explanation, Verbal cues, and Handouts Education comprehension: verbalized understanding   HOME EXERCISE PROGRAM: Access Code: [email or web address] URL: [email or web address] Date: 07/03/2022 Prepared by: Verta Ellen  Exercises - Hooklying Hamstring Stretch with Strap  - 2-3 x daily - 7 x weekly - 3 reps - 30 sec hold - Supine ITB Stretch with Strap  - 2-3 x daily - 7 x weekly - 3 reps - 30 sec hold - Supine Hip Adductor Stretch  - 2-3 x daily - 7 x weekly - 3 reps - 30 sec hold - Supine Piriformis Stretch with Foot on Ground  - 2-3 x daily - 7 x weekly - 3 reps - 30 sec hold - Modified Thomas Stretch  - 2-3 x daily - 7 x weekly - 3 reps - 30 sec hold - Supine Lower Trunk Rotation  - 2-3 x daily - 7 x weekly - 5 reps - 10-30 sec hold - Supine Posterior Pelvic Tilt  - 1 x daily - 7 x weekly - 2 sets - 10 reps - 3 sec hold - Supine Bridge  - 1 x daily - 7 x weekly - 2 sets - 10 reps - Supine Hip Adduction Isometric with Ball  - 1 x daily - 7 x weekly - 2 sets - 10 reps - 3 sec hold - Hooklying Clamshell with Resistance  - 1 x daily - 7 x weekly - 2 sets - 10 reps - 3 sec hold  ASSESSMENT:  CLINICAL IMPRESSION: Progressed exercises today to continue working on hip strengthening. Modified golfers lift to decrease ROM with R LE to reduce pain. Monitored pain levels with each intervention to ensure comfort with each exercise. Reviewed fall prevention handout with patient and provided handout for home. Pt reported good HEP routine, still with a challenge so deferred HEP update today.  OBJECTIVE IMPAIRMENTS: Abnormal gait,  decreased activity tolerance, decreased balance, decreased endurance, decreased knowledge of condition, decreased knowledge of use of DME, decreased mobility, difficulty walking, decreased ROM, decreased strength, decreased safety awareness, increased fascial restrictions, impaired perceived functional ability, increased muscle spasms, impaired flexibility, impaired sensation, improper body mechanics, postural dysfunction, and pain.   ACTIVITY LIMITATIONS: carrying, lifting, bending, standing, squatting, sleeping, stairs, transfers, bed mobility, and locomotion level  PARTICIPATION LIMITATIONS: meal prep, cleaning, laundry, driving, shopping, community activity, and yard work  PERSONAL FACTORS: Age, Fitness, Past/current experiences, Time since onset of injury/illness/exacerbation, and 3+ comorbidities: B THA - L THA 2014, R THA 2016, R THA revision 2019; chronic R hip pain, synovitis of hip, lumbar spondylosis/OA, DM-II, HTN, GERD, IBS, chronic diarrhea, bladder and rectal prolapse, hyperthyroidism, aortic insufficieny, aortic and mitral valve disorder, mitral regurgitation, neuropathy, RLS, h/o transient global amnesia  are also affecting patient's functional outcome.  REHAB POTENTIAL: Good  CLINICAL DECISION MAKING: Evolving/moderate complexity  EVALUATION COMPLEXITY: Moderate   GOALS: Goals reviewed with patient? Yes  SHORT TERM GOALS: Target date: 07/24/2022   Patient will be independent with initial HEP.  Baseline:  Goal status: IN PROGRESS  2.  Patient will report centralization of radicular symptoms.  Baseline:  Goal status: IN PROGRESS  3.  Patient will verbalize understanding of fall prevention measures in home to reduce risk for falls Baseline:  Goal status: IN PROGRESS  4.  Patient will demonstrate decreased TUG time to </= 13.5 sec to decrease risk for falls with transitional mobility Baseline: 15.75 sec Goal status: IN PROGRESS  LONG TERM GOALS: Target date: 08/21/2022     Patient will be independent with advanced/ongoing HEP to improve outcomes and carryover.  Baseline:  Goal status: IN PROGRESS  2.  Patient will report 50-75% improvement in low back pain to improve QOL.  Baseline:  Goal status: IN PROGRESS  3.  Patient will report at least 50-75% improvement in R hip pain to improve QOL. Baseline:  Goal status: IN PROGRESS  4.  Patient to demonstrate ability to achieve and maintain good spinal alignment/posturing and body mechanics needed for daily activities. Baseline:  Goal status: IN PROGRESS  5.  Patient will demonstrate full pain free lumbar ROM to perform ADLs.   Baseline:  Goal status: IN PROGRESS  6.  Patient will demonstrate improved B LE strength to >/= 4+/5 for improved stability and ease of mobility . Baseline:  Goal status: IN PROGRESS  7.  Patient will report 48 on hip FOTO to demonstrate improved functional ability.  Baseline: 44 Goal status: IN PROGRESS   8.  Patient will tolerate 20-30 min of standing and/or walking to perform chores, cooking or grocery shopping. Baseline:  Goal status: IN PROGRESS  9.  Patient will demonstrate at least 19/30 on FGA to decrease risk of falls. Baseline: 15/30 Goal status: REVISED  10.  Patient will improve 5x STS time to </= 15 seconds to demonstrate improved functional strength and transfer efficiency  Baseline: 19.06 sec Goal status: IN PROGRESS   PLAN: PT FREQUENCY: 2x/week  PT DURATION: 8 weeks  PLANNED INTERVENTIONS: Therapeutic exercises, Therapeutic activity, Neuromuscular re-education, Balance training, Gait training, Patient/Family education, Joint mobilization, Stair training, DME instructions, Dry Needling, Electrical stimulation, Spinal mobilization, Cryotherapy, Moist heat, scar mobilization, Taping, Traction, Ultrasound, Ionotophoresis 4mg /ml Dexamethasone, Manual therapy, and Re-evaluation.  PLAN FOR NEXT SESSION: progress lumbopelvic flexibility and strengthening as  tolerated - review/update HEP as indicated; review gait training with SPC in L hand PRN; MT +/- DN to address abnormal muscle tension/tightness and pain in B lumbar paraspinals and glutes; modalities PRN for pain   , PTA 07/10/2022, 10:58 AM

## 2022-07-13 ENCOUNTER — Encounter: Payer: Medicare Other | Admitting: Physical Therapy

## 2022-07-17 ENCOUNTER — Ambulatory Visit: Payer: PPO | Attending: Orthopedic Surgery | Admitting: Physical Therapy

## 2022-07-17 ENCOUNTER — Encounter: Payer: Self-pay | Admitting: Physical Therapy

## 2022-07-17 DIAGNOSIS — M5416 Radiculopathy, lumbar region: Secondary | ICD-10-CM | POA: Insufficient documentation

## 2022-07-17 DIAGNOSIS — M25551 Pain in right hip: Secondary | ICD-10-CM | POA: Diagnosis present

## 2022-07-17 DIAGNOSIS — R2689 Other abnormalities of gait and mobility: Secondary | ICD-10-CM | POA: Insufficient documentation

## 2022-07-17 DIAGNOSIS — R262 Difficulty in walking, not elsewhere classified: Secondary | ICD-10-CM | POA: Insufficient documentation

## 2022-07-17 DIAGNOSIS — M6281 Muscle weakness (generalized): Secondary | ICD-10-CM | POA: Diagnosis present

## 2022-07-17 DIAGNOSIS — M5459 Other low back pain: Secondary | ICD-10-CM | POA: Diagnosis present

## 2022-07-17 NOTE — Therapy (Signed)
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    Constipation    Diabetes mellitus without complication (HCC)    GERD (gastroesophageal reflux disease)    Hypertension    Hyperthyroidism    Lumbar spondylosis    Osteoarthritis of lumbar spine    Synovitis of hip    Tarlov cyst    Past Surgical History:  Procedure Laterality Date   cataract     ELBOW SURGERY Right    HIP SURGERY     Bilateral THR   TOTAL HIP ARTHROPLASTY     VAGINAL HYSTERECTOMY     Patient Active Problem List   Diagnosis Date Noted   Bruit 11/23/2021   Fall 11/23/2021   Dyslipidemia 09/17/2021   Educated about COVID-19 virus infection 09/22/2020   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   Right hip pain 02/04/2018   Hypertension 02/04/2018   Amnesia 02/04/2018   Transient amnesia 02/03/2018   Aortic insufficiency 10/01/2016   Mitral regurgitation 10/01/2016    PCP: [name], MD  REFERRING PROVIDER: Ollen Gross, MD  REFERRING DIAG:  M25.551 (ICD-10-CM) - Pain in right hip  M54.50 (ICD-10-CM) - Low back pain    RATIONALE FOR EVALUATION AND TREATMENT: Rehabilitation  THERAPY DIAG:  Pain in right hip  Other low back pain  Radiculopathy, lumbar region  Difficulty in walking, not elsewhere classified  Other abnormalities of gait and mobility  Muscle weakness (generalized)  ONSET  DATE: 2019  NEXT MD VISIT: TBD   SUBJECTIVE:                                                                                                                                                                                           SUBJECTIVE STATEMENT: Pt reports the DN worked wonders and her R hip is still feeling better a week later but thinks her L  hip might need some attention.  PAIN:  Are you having pain? Yes: NPRS scale: 10/10 Pain location: L low back/buttock Pain description: sharp at times, dull ache Aggravating factors: just got out bed one morning ~3 days ago and couldn't get up Relieving factors: nothing  PERTINENT HISTORY:  B THA - L THA 2014, R THA 2016, R THA revision 2019; chronic R hip pain, synovitis of hip, lumbar spondylosis/OA, DM-II, HTN, GERD, IBS, chronic diarrhea, bladder and rectal prolapse, hyperthyroidism, aortic insufficieny, aortic and mitral valve disorder, mitral regurgitation, neuropathy, RLS, h/o transient global amnesia  PRECAUTIONS: None  WEIGHT BEARING RESTRICTIONS No  FALLS:  Has patient fallen in last 6 months?  None in past 6 months but 2 bad falls in the past few years  LIVING ENVIRONMENT: Lives with: lives with their spouse Lives in: House/apartment Stairs: Yes: Internal: 14 steps; on right going up and on left going up and External: 1 steps; none and ramp available  - mainly stays on main level of home Has following equipment at home: Single point cane, Walker - 4 wheeled, Grab bars, Ramped entry, and 3-wheel RW  OCCUPATION: Reitired  PLOF: Independent, Needs assistance with homemaking, and Leisure: "[employer]", does exercises in bed before she gets up in the morning  PATIENT GOALS: "Walk better"   OBJECTIVE:   DIAGNOSTIC FINDINGS:  MRI R hip - 02/05/18: Findings compatible with mild synovitis about the patient's right hip arthroplasty. Atrophy of the adductor brevis and magnus muscles bilaterally is worse on the left. Mild  edema in the right adductor muscles is likely related to atrophy. No muscle tear is identified. Trace amount of fluid in the right trochanteric bursa compatible with bursitis. MRI Lumbar spine - 02/05/18: Some increase in the size of a down turning right paracentral protrusion at L4-5. The disc impinges on the descending right L5 root. There is mild to moderate central canal stenosis overall at L4-5.     No change in a shallow disc bulge and small down turning right paracentral protrusion at L3-4. Moderate central canal stenosis and narrowing in the subarticular recesses appear unchanged.    No change in moderate central canal stenosis and narrowing in the subarticular recesses at L2-3 where there is a disc bulge and endplate spur.      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  PATIENT SURVEYS:  FOTO Hip = 44; predicted = 48  SCREENING FOR RED FLAGS: Bowel or bladder incontinence: Yes: h/o prolapsed bladder and rectum Spinal tumors: No Cauda equina syndrome: No Compression fracture: No Abdominal aneurysm: No  COGNITION:  Overall cognitive status: Within functional limits for tasks assessed     SENSATION:  Numb along R hip incisional scar; intermittent tingling down R LE    MUSCLE LENGTH:  Hamstrings: mild tight R>L ITB: mod tight R>L Piriformis: mod tight R>L Hip flexors: mod tight R Quads: mild/mod tight R>L Heelcord: WFL  POSTURE:  Symmetrical hip height and leg length but slight weight shift left   PALPATION: Decreased R hip incisional scar mobility with TTP; Increased muscle tension and TTP in R>L lower lumbar paraspinals and R>L glutes  LUMBAR ROM:   Active  A/PROM  Eval - 06/26/22  Flexion Fingertips to ankles  Extension 25% limited  Right lateral flexion Hand to mid lower leg  Left lateral flexion Hand to lateral knee  Right rotation 25% limited   Left rotation 25% limited - pain   (Blank rows = not  tested)  LOWER EXTREMITY ROM:     WFL with exceptions of limited hip IR/ER R>L  LOWER EXTREMITY MMT:    MMT Right Eval 06/26/22 Left Eval 06/26/22  Hip flexion 4- * 4-  Hip extension 2+ 3+  Hip abduction 3+ 4-  Hip adduction 3- 4  Hip internal rotation 4- 4-  Hip external rotation 3- 3-  Knee flexion 4+ 5  Knee extension 4+ 5  Ankle dorsiflexion 4- 4  Ankle plantarflexion 4- 8 SL HR 4 11 SL HR  Ankle inversion    Ankle eversion     (Blank rows = not tested) *-pain with resistance  LUMBAR SPECIAL TESTS:  Straight leg raise test: + on R and FABER test: + on R     FUNCTIONAL TESTS: (completed w/o SPC) 5 times sit to stand: 19.06 sec; >15 sec indicates risk for recurrent falls Timed up and go (TUG): 15.75 sec; >13.5 sec indicates high fall risk 10 meter walk test: 11.69 sec - 2.81 ft sec (community ambulator)  Functional gait assessment: 15/30; < 19 = high risk fall      GAIT: Distance walked: 2 x 50 ft Assistive device utilized: Single point cane Level of assistance: SBA/supervision Gait pattern: step through pattern, decreased stance time- Right, decreased stride length, and antalgic on R with slight L Trendelenburg drop during R stance Comments: Cues necessary for proper sequencing of SPC using cane in L hand to offset issues with R hip Stairs: 14 steps with alternating pattern with single R rail support - increased effort noted with LE lift and rise to next step on ascent and slight substitution with lateral pelvic/hip drop with fwd rotation on descent   TODAY'S TREATMENT   07/17/22 THERAPEUTIC EXERCISE: to improve flexibility, strength and mobility.  Verbal and tactile cues throughout for technique. NuStep L5 x 6 min Seated B KTOS piriformis stretch 2 x 30" Seated R figure-4 hip hinge piriformis stretch x 30" Lumbar extension in standing 10 x 3-5" Demonstrated POE extension and prone press-ups but not attempted  MANUAL THERAPY: To promote normalized muscle  tension, improved flexibility, and reduced pain. Skilled palpation and monitoring of soft tissue during DN Trigger Point Dry-Needling  Treatment instructions: Expect mild to moderate muscle soreness. Patient verbalized understanding of these instructions and education. Patient Consent Given: Yes Education handout provided: Previously provided Muscles treated: L glute medius and minimus, medial and lateral piriformis Electrical stimulation performed: No Parameters: N/A Treatment response/outcome: Twitch Response Elicited and Palpable Increase in Muscle Length SMT/DTM, manual TPR and pin & stretch to muscles addressed with DN   07/10/22 Therapeutic Exercise: Nustep L4x9min Review of fall prevention & safety handout Squats x 10 Standing hip ABD, march, ext x 20 Golfers lifts x 10 bil - decreased ROM required supported on R LE Supine bridge with legs straight x 10 LTR with feet bent over x 10 bil Supine R KTOS stretch x 30 sec hold   07/06/22 THERAPEUTIC EXERCISE: to improve flexibility, strength and mobility.  Verbal and tactile cues throughout for technique. Rec bike L2 x 6 min R hooklying KTOS 2 x 30"  GAIT TRAINING: To normalize gait pattern and improve safety with SPC . 120 ft with SPC in L hand - cues for sequencing of cane with R LE advancement, explaining rationale for use of AD on opposite to maintain reciprocal pattern  MANUAL THERAPY: To promote normalized muscle tension, improved flexibility, and reduced pain. Skilled palpation and monitoring of soft tissue during DN Trigger Point Dry-Needling  Treatment instructions: Expect mild to moderate muscle soreness. Patient verbalized understanding of these instructions and education. Patient Consent Given: Yes Education handout provided: Yes Muscles treated: R glute medius and minimus Electrical stimulation performed: No Parameters: N/A Treatment response/outcome: Twitch Response Elicited and Palpable Increase in Muscle  Length STM/DTM and manual TPR to muscles addressed with DN  SELF CARE: Provided instruction in self-STM/TPR to glutes/piriformis using tennis ball on wall     PATIENT EDUCATION:  Education details: review of self-STM techniques to R glutes using tennis ball on wall, role of DN, and DN rational, procedure, outcomes, potential side effects, and recommended post-treatment exercises/activity Person educated: Patient Education method: Explanation Education comprehension: verbalized understanding   HOME EXERCISE PROGRAM: Access Code: [email or web address] URL: [email or web address] Date: 07/03/2022 Prepared by: Verta EllenBraylin Clark  Exercises - Hooklying Hamstring Stretch with Strap  - 2-3 x daily - 7 x weekly - 3 reps - 30 sec hold - Supine ITB Stretch with Strap  - 2-3 x daily - 7 x weekly - 3 reps - 30 sec hold - Supine Hip Adductor Stretch  - 2-3 x daily - 7 x weekly - 3 reps - 30 sec hold - Supine Piriformis Stretch with Foot on Ground  - 2-3 x daily - 7 x weekly - 3 reps - 30 sec hold - Modified Thomas Stretch  - 2-3 x daily - 7 x weekly - 3 reps - 30 sec hold - Supine Lower Trunk Rotation  - 2-3 x daily - 7 x weekly - 5 reps - 10-30 sec hold - Supine Posterior Pelvic Tilt  - 1 x daily - 7 x weekly - 2 sets - 10 reps - 3 sec hold - Supine Bridge  - 1 x daily - 7 x weekly - 2 sets - 10 reps - Supine Hip Adduction Isometric with Ball  - 1 x daily - 7 x weekly - 2 sets - 10 reps - 3 sec hold - Hooklying Clamshell with Resistance  - 1 x daily - 7 x weekly - 2 sets - 10 reps - 3 sec hold  ASSESSMENT:  CLINICAL IMPRESSION: Malachi BondsGloria reports excellent relief from DN to R glutes but states increased pain in L glutes for the past ~3 days w/o known trigger - would like to try DN here. Taut tender bands and TPs identified in L glute medius and minimus as well as medial and lateral piriformis which was addressed with MT incorporating DN - good twitch responses elicited resulting in palpable reduction  in muscle tension and TTP with pt noting less pain during transitional mobility following DN. Reviewed relevant stretches and offered instruction in seated alternatives as well as confirmed rationale for extension based exercises, providing guidance on prone options in addition to extension in standing as pt noting relief with this.  OBJECTIVE IMPAIRMENTS: Abnormal gait, decreased activity tolerance, decreased balance, decreased endurance, decreased knowledge of condition, decreased knowledge of use of DME, decreased mobility, difficulty walking, decreased ROM, decreased strength, decreased safety awareness, increased fascial restrictions, impaired perceived functional ability, increased muscle spasms, impaired flexibility, impaired sensation, improper body mechanics, postural dysfunction, and pain.   ACTIVITY LIMITATIONS: carrying, lifting, bending, standing, squatting, sleeping, stairs, transfers, bed mobility, and locomotion level  PARTICIPATION LIMITATIONS: meal prep, cleaning, laundry, driving, shopping, community activity, and yard work  PERSONAL FACTORS: Age, Fitness, Past/current experiences, Time since onset of injury/illness/exacerbation, and 3+ comorbidities: B THA - L THA 2014, R THA 2016, R THA revision 2019; chronic R hip pain, synovitis of hip,  lumbar spondylosis/OA, DM-II, HTN, GERD, IBS, chronic diarrhea, bladder and rectal prolapse, hyperthyroidism, aortic insufficieny, aortic and mitral valve disorder, mitral regurgitation, neuropathy, RLS, h/o transient global amnesia  are also affecting patient's functional outcome.   REHAB POTENTIAL: Good  CLINICAL DECISION MAKING: Evolving/moderate complexity  EVALUATION COMPLEXITY: Moderate   GOALS: Goals reviewed with patient? Yes  SHORT TERM GOALS: Target date: 07/24/2022   Patient will be independent with initial HEP.  Baseline:  Goal status: IN PROGRESS  2.  Patient will report centralization of radicular symptoms.  Baseline:   Goal status: IN PROGRESS  3.  Patient will verbalize understanding of fall prevention measures in home to reduce risk for falls Baseline:  Goal status: IN PROGRESS  4.  Patient will demonstrate decreased TUG time to </= 13.5 sec to decrease risk for falls with transitional mobility Baseline: 15.75 sec Goal status: IN PROGRESS  LONG TERM GOALS: Target date: 08/21/2022    Patient will be independent with advanced/ongoing HEP to improve outcomes and carryover.  Baseline:  Goal status: IN PROGRESS  2.  Patient will report 50-75% improvement in low back pain to improve QOL.  Baseline:  Goal status: IN PROGRESS  3.  Patient will report at least 50-75% improvement in R hip pain to improve QOL. Baseline:  Goal status: IN PROGRESS  4.  Patient to demonstrate ability to achieve and maintain good spinal alignment/posturing and body mechanics needed for daily activities. Baseline:  Goal status: IN PROGRESS  5.  Patient will demonstrate full pain free lumbar ROM to perform ADLs.   Baseline:  Goal status: IN PROGRESS  6.  Patient will demonstrate improved B LE strength to >/= 4+/5 for improved stability and ease of mobility . Baseline:  Goal status: IN PROGRESS  7.  Patient will report 48 on hip FOTO to demonstrate improved functional ability.  Baseline: 44 Goal status: IN PROGRESS   8.  Patient will tolerate 20-30 min of standing and/or walking to perform chores, cooking or grocery shopping. Baseline:  Goal status: IN PROGRESS  9.  Patient will demonstrate at least 19/30 on FGA to decrease risk of falls. Baseline: 15/30 Goal status: REVISED  10.  Patient will improve 5x STS time to </= 15 seconds to demonstrate improved functional strength and transfer efficiency  Baseline: 19.06 sec Goal status: IN PROGRESS   PLAN: PT FREQUENCY: 2x/week  PT DURATION: 8 weeks  PLANNED INTERVENTIONS: Therapeutic exercises, Therapeutic activity, Neuromuscular re-education, Balance training,  Gait training, Patient/Family education, Joint mobilization, Stair training, DME instructions, Dry Needling, Electrical stimulation, Spinal mobilization, Cryotherapy, Moist heat, scar mobilization, Taping, Traction, Ultrasound, Ionotophoresis 4mg /ml Dexamethasone, Manual therapy, and Re-evaluation.  PLAN FOR NEXT SESSION: STG assessment; reassess LE MMT to ensure targeted strengthening for areas of ongoing weakness; progress lumbopelvic flexibility and strengthening as tolerated - review/update HEP as indicated; MT +/- DN to address abnormal muscle tension/tightness and pain in B lumbar paraspinals and glutes; modalities PRN for pain   , PT 07/17/2022, 10:29 AM

## 2022-07-19 ENCOUNTER — Encounter: Payer: Self-pay | Admitting: Physical Therapy

## 2022-07-19 ENCOUNTER — Ambulatory Visit: Payer: PPO | Admitting: Physical Therapy

## 2022-07-19 DIAGNOSIS — M5416 Radiculopathy, lumbar region: Secondary | ICD-10-CM

## 2022-07-19 DIAGNOSIS — M25551 Pain in right hip: Secondary | ICD-10-CM

## 2022-07-19 DIAGNOSIS — R2689 Other abnormalities of gait and mobility: Secondary | ICD-10-CM

## 2022-07-19 DIAGNOSIS — M6281 Muscle weakness (generalized): Secondary | ICD-10-CM

## 2022-07-19 DIAGNOSIS — R262 Difficulty in walking, not elsewhere classified: Secondary | ICD-10-CM

## 2022-07-19 DIAGNOSIS — M5459 Other low back pain: Secondary | ICD-10-CM

## 2022-07-19 NOTE — Therapy (Signed)
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    Constipation    Diabetes mellitus without complication (HCC)    GERD (gastroesophageal reflux disease)    Hypertension    Hyperthyroidism    Lumbar spondylosis    Osteoarthritis of lumbar spine    Synovitis of hip    Tarlov cyst    Past Surgical History:  Procedure Laterality Date   cataract     ELBOW SURGERY Right    HIP SURGERY     Bilateral THR   TOTAL HIP ARTHROPLASTY     VAGINAL HYSTERECTOMY     Patient Active Problem List   Diagnosis Date Noted   Bruit 11/23/2021   Fall 11/23/2021   Dyslipidemia 09/17/2021   Educated about COVID-19 virus infection 09/22/2020   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   Right hip pain 02/04/2018   Hypertension 02/04/2018   Amnesia 02/04/2018   Transient amnesia 02/03/2018   Aortic insufficiency 10/01/2016   Mitral regurgitation 10/01/2016    PCP: Robyne Peers, MD  REFERRING PROVIDER: Gaynelle Arabian, MD  REFERRING DIAG:  M25.551 (ICD-10-CM) - Pain in right hip  M54.50 (ICD-10-CM) - Low back pain    RATIONALE FOR EVALUATION AND TREATMENT: Rehabilitation  THERAPY DIAG:  Pain in right hip  Other low back pain  Radiculopathy, lumbar region  Difficulty in walking, not elsewhere classified  Other abnormalities of gait and mobility  Muscle weakness (generalized)  ONSET  DATE: 2019  NEXT MD VISIT: TBD   SUBJECTIVE:                                                                                                                                                                                           SUBJECTIVE STATEMENT: Pt reports the DN worked wonders again. She noted a little bit of pain upon getting out of bed this  morning but none currently.  PAIN:  Are you having pain? No  PERTINENT HISTORY:  B THA - L THA 2014, R THA 2016, R THA revision 2019; chronic R hip pain, synovitis of hip, lumbar spondylosis/OA, DM-II, HTN, GERD, IBS, chronic diarrhea, bladder and rectal prolapse, hyperthyroidism, aortic insufficieny, aortic and mitral valve disorder, mitral regurgitation, neuropathy, RLS, h/o transient global amnesia  PRECAUTIONS: None  WEIGHT BEARING RESTRICTIONS No  FALLS:  Has patient fallen in last 6 months?  None in past 6 months but 2 bad falls in the past few years  LIVING ENVIRONMENT: Lives with: lives with their spouse Lives in: House/apartment Stairs: Yes: Internal: 14 steps; on right going up and on left going up and External: 1 steps; none and ramp available  - mainly stays on main level of home Has following equipment at home: Single point cane, Walker - 4 wheeled, Grab bars, Ramped entry, and 3-wheel RW  OCCUPATION: Reitired  PLOF: Independent, Needs assistance with homemaking, and Leisure: "[employer]", does exercises in bed before she gets up in the morning  PATIENT GOALS: "Walk better"   OBJECTIVE:   DIAGNOSTIC FINDINGS:  MRI R hip - 02/05/18: Findings compatible with mild synovitis about the patient's right hip arthroplasty. Atrophy of the adductor brevis and magnus muscles bilaterally is worse on the left. Mild edema in the right adductor muscles is likely related to atrophy. No muscle tear is identified. Trace amount of fluid in the right trochanteric bursa compatible with bursitis. MRI Lumbar spine - 02/05/18: Some  increase in the size of a down turning right paracentral protrusion at L4-5. The disc impinges on the descending right L5 root. There is mild to moderate central canal stenosis overall at L4-5.     No change in a shallow disc bulge and small down turning right paracentral protrusion at L3-4. Moderate central canal stenosis and narrowing in the subarticular recesses appear unchanged.    No change in moderate central canal stenosis and narrowing in the subarticular recesses at L2-3 where there is a disc bulge and endplate spur.      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  PATIENT SURVEYS:  FOTO Hip = 44; predicted = 48  SCREENING FOR RED FLAGS: Bowel or bladder incontinence: Yes: h/o prolapsed bladder and rectum Spinal tumors: No Cauda equina syndrome: No Compression fracture: No Abdominal aneurysm: No  COGNITION:  Overall cognitive status: Within functional limits for tasks assessed     SENSATION:  Numb along R hip incisional scar; intermittent tingling down R LE    MUSCLE LENGTH:  Hamstrings: mild tight R>L ITB: mod tight R>L Piriformis: mod tight R>L Hip flexors: mod tight R Quads: mild/mod tight R>L Heelcord: WFL  POSTURE:  Symmetrical hip height and leg length but slight weight shift left   PALPATION: Decreased R hip incisional scar mobility with TTP; Increased muscle tension and TTP in R>L lower lumbar paraspinals and R>L glutes  LUMBAR ROM:   Active  A/PROM  Eval - 06/26/22  Flexion Fingertips to ankles  Extension 25% limited  Right lateral flexion Hand to mid lower leg  Left lateral flexion Hand to lateral knee  Right rotation 25% limited   Left rotation 25% limited - pain   (Blank rows = not tested)  LOWER EXTREMITY ROM:     WFL with exceptions of limited hip IR/ER R>L  LOWER EXTREMITY MMT:    MMT Right Eval 06/26/22 Left Eval 06/26/22  Hip flexion 4- *  4-  Hip extension 2+ 3+  Hip abduction 3+ 4-   Hip adduction 3- 4  Hip internal rotation 4- 4-  Hip external rotation 3- 3-  Knee flexion 4+ 5  Knee extension 4+ 5  Ankle dorsiflexion 4- 4  Ankle plantarflexion 4- 8 SL HR 4 11 SL HR  Ankle inversion    Ankle eversion     (Blank rows = not tested) *-pain with resistance  LUMBAR SPECIAL TESTS:  Straight leg raise test: + on R and FABER test: + on R     FUNCTIONAL TESTS: (completed w/o SPC) 5 times sit to stand: 19.06 sec; >15 sec indicates risk for recurrent falls Timed up and go (TUG): 15.75 sec; >13.5 sec indicates high fall risk 10 meter walk test: 11.69 sec - 2.81 ft sec (community ambulator)  Functional gait assessment: 15/30; < 19 = high risk fall      GAIT: Distance walked: 2 x 50 ft Assistive device utilized: Single point cane Level of assistance: SBA/supervision Gait pattern: step through pattern, decreased stance time- Right, decreased stride length, and antalgic on R with slight L Trendelenburg drop during R stance Comments: Cues necessary for proper sequencing of SPC using cane in L hand to offset issues with R hip Stairs: 14 steps with alternating pattern with single R rail support - increased effort noted with LE lift and rise to next step on ascent and slight substitution with lateral pelvic/hip drop with fwd rotation on descent   TODAY'S TREATMENT   07/19/22 THERAPEUTIC EXERCISE: to improve flexibility, strength and mobility.  Verbal and tactile cues throughout for technique. NuStep L5 x 6 min  SELF CARE:  Reviewed the "Check for Safety - Home Fall Prevention Checklist for Older Adults" to help identify fall risk hazards in the home along with strategies to reduce fall risk at home Provided education in proper posture and body mechanics for typical daily positioning and household chores to minimize strain on low back.  THERAPEUTIC ACTIVITIES: TUG = 12.03 sec 5xSTS = 17.0 sec   07/17/22 THERAPEUTIC EXERCISE: to improve flexibility, strength and mobility.   Verbal and tactile cues throughout for technique. NuStep L5 x 6 min Seated B KTOS piriformis stretch 2 x 30" Seated R figure-4 hip hinge piriformis stretch x 30" Lumbar extension in standing 10 x 3-5" Demonstrated POE extension and prone press-ups but not attempted  MANUAL THERAPY: To promote normalized muscle tension, improved flexibility, and reduced pain. Skilled palpation and monitoring of soft tissue during DN Trigger Point Dry-Needling  Treatment instructions: Expect mild to moderate muscle soreness. Patient verbalized understanding of these instructions and education. Patient Consent Given: Yes Education handout provided: Previously provided Muscles treated: L glute medius and minimus, medial and lateral piriformis Electrical stimulation performed: No Parameters: N/A Treatment response/outcome: Twitch Response Elicited and Palpable Increase in Muscle Length SMT/DTM, manual TPR and pin & stretch to muscles addressed with DN   07/10/22 Therapeutic Exercise: Nustep [id number] Review of fall prevention & safety handout Squats x 10 Standing hip ABD, march, ext x 20 Golfers lifts x 10 bil - decreased ROM required supported on R LE Supine bridge with legs straight x 10 LTR with feet bent over x 10 bil Supine R KTOS stretch x 30 sec hold   PATIENT EDUCATION:  Education details: progress with PT, posture and body mechanics for typical daily postioning, mobility and household tasks, and Check for Safety - Home Fall Prevention Checklist for Older Adults  Person educated: Patient Education method: Explanation, Demonstration,  Verbal cues, and Handouts Education comprehension: verbalized understanding   HOME EXERCISE PROGRAM: Access Code: [email or web address] URL: [email or web address] Date: 07/19/2022 Prepared by: Annie Paras  Exercises - Hooklying Hamstring Stretch with Strap  - 2-3 x daily - 7 x weekly - 3 reps - 30 sec hold - Supine ITB Stretch with Strap  - 2-3 x daily -  7 x weekly - 3 reps - 30 sec hold - Supine Hip Adductor Stretch  - 2-3 x daily - 7 x weekly - 3 reps - 30 sec hold - Supine Piriformis Stretch with Foot on Ground  - 2-3 x daily - 7 x weekly - 3 reps - 30 sec hold - Modified Thomas Stretch  - 2-3 x daily - 7 x weekly - 3 reps - 30 sec hold - Supine Lower Trunk Rotation  - 2-3 x daily - 7 x weekly - 5 reps - 10-30 sec hold - Supine Posterior Pelvic Tilt  - 1 x daily - 7 x weekly - 2 sets - 10 reps - 3 sec hold - Supine Bridge  - 1 x daily - 7 x weekly - 2 sets - 10 reps - Supine Hip Adduction Isometric with Ball  - 1 x daily - 7 x weekly - 2 sets - 10 reps - 3 sec hold - Hooklying Clamshell with Resistance  - 1 x daily - 7 x weekly - 2 sets - 10 reps - 3 sec hold  Patient Education - Check for Safety - Posture and Body Mechanics  ASSESSMENT:  CLINICAL IMPRESSION: Rosalyn reports excellent relief from DN last session and arrives to PT session pain free, although she did note a twinge in her hip when getting out of the car. Education provided on proper posture and body mechanics for typical daily positioning, mobility and household task with pt noting several activities where she could improve her alignment. We also reviewed the "Check for Safety - Home Fall Prevention Checklist for Older Adults" to help her identify fall risk hazards in the home along with strategies to reduce fall risk at home. Gains noted on TUG (15.75 sec to 12.03 sec) and 5xSTS (19.06 sec to 17.00 sec) reassessment. Weslynn is progressing well with all STGs now met.  OBJECTIVE IMPAIRMENTS: Abnormal gait, decreased activity tolerance, decreased balance, decreased endurance, decreased knowledge of condition, decreased knowledge of use of DME, decreased mobility, difficulty walking, decreased ROM, decreased strength, decreased safety awareness, increased fascial restrictions, impaired perceived functional ability, increased muscle spasms, impaired flexibility, impaired sensation,  improper body mechanics, postural dysfunction, and pain.   ACTIVITY LIMITATIONS: carrying, lifting, bending, standing, squatting, sleeping, stairs, transfers, bed mobility, and locomotion level  PARTICIPATION LIMITATIONS: meal prep, cleaning, laundry, driving, shopping, community activity, and yard work  PERSONAL FACTORS: Age, Fitness, Past/current experiences, Time since onset of injury/illness/exacerbation, and 3+ comorbidities: B THA - L THA 2014, R THA 2016, R THA revision 2019; chronic R hip pain, synovitis of hip, lumbar spondylosis/OA, DM-II, HTN, GERD, IBS, chronic diarrhea, bladder and rectal prolapse, hyperthyroidism, aortic insufficieny, aortic and mitral valve disorder, mitral regurgitation, neuropathy, RLS, h/o transient global amnesia  are also affecting patient's functional outcome.   REHAB POTENTIAL: Good  CLINICAL DECISION MAKING: Evolving/moderate complexity  EVALUATION COMPLEXITY: Moderate   GOALS: Goals reviewed with patient? Yes  SHORT TERM GOALS: Target date: 07/24/2022   Patient will be independent with initial HEP.  Baseline:  Goal status: MET  07/19/22  2.  Patient will report centralization of radicular symptoms.  Baseline:  Goal  status: MET  07/19/22  3.  Patient will verbalize understanding of fall prevention measures in home to reduce risk for falls Baseline:  Goal status: MET  07/19/22  4.  Patient will demonstrate decreased TUG time to </= 13.5 sec to decrease risk for falls with transitional mobility Baseline: 15.75 sec Goal status: MET  07/19/22 - TUG = 12.03  LONG TERM GOALS: Target date: 08/21/2022    Patient will be independent with advanced/ongoing HEP to improve outcomes and carryover.  Baseline:  Goal status: IN PROGRESS  2.  Patient will report 50-75% improvement in low back pain to improve QOL.  Baseline:  Goal status: IN PROGRESS  3.  Patient will report at least 50-75% improvement in R hip pain to improve QOL. Baseline:  Goal status: IN  PROGRESS  4.  Patient to demonstrate ability to achieve and maintain good spinal alignment/posturing and body mechanics needed for daily activities. Baseline:  Goal status: IN PROGRESS  5.  Patient will demonstrate full pain free lumbar ROM to perform ADLs.   Baseline:  Goal status: IN PROGRESS  6.  Patient will demonstrate improved B LE strength to >/= 4+/5 for improved stability and ease of mobility . Baseline:  Goal status: IN PROGRESS  7.  Patient will report 48 on hip FOTO to demonstrate improved functional ability.  Baseline: 44 Goal status: IN PROGRESS   8.  Patient will tolerate 20-30 min of standing and/or walking to perform chores, cooking or grocery shopping. Baseline:  Goal status: IN PROGRESS  9.  Patient will demonstrate at least 19/30 on FGA to decrease risk of falls. Baseline: 15/30 Goal status: REVISED  10.  Patient will improve 5x STS time to </= 15 seconds to demonstrate improved functional strength and transfer efficiency  Baseline: 19.06 sec Goal status: IN PROGRESS  07/19/22 - 5xSTS = 17.0 sec   PLAN: PT FREQUENCY: 2x/week  PT DURATION: 8 weeks  PLANNED INTERVENTIONS: Therapeutic exercises, Therapeutic activity, Neuromuscular re-education, Balance training, Gait training, Patient/Family education, Joint mobilization, Stair training, DME instructions, Dry Needling, Electrical stimulation, Spinal mobilization, Cryotherapy, Moist heat, scar mobilization, Taping, Traction, Ultrasound, Ionotophoresis 23m/ml Dexamethasone, Manual therapy, and Re-evaluation.  PLAN FOR NEXT SESSION: STG assessment; reassess LE MMT to ensure targeted strengthening for areas of ongoing weakness; progress lumbopelvic flexibility and strengthening as tolerated - review/update HEP as indicated; MT +/- DN to address abnormal muscle tension/tightness and pain in B lumbar paraspinals and glutes; modalities PRN for pain   JPercival Spanish PT 07/19/2022, 6:44 PM

## 2022-07-23 ENCOUNTER — Ambulatory Visit: Payer: PPO

## 2022-07-23 DIAGNOSIS — M25551 Pain in right hip: Secondary | ICD-10-CM

## 2022-07-23 DIAGNOSIS — R2689 Other abnormalities of gait and mobility: Secondary | ICD-10-CM

## 2022-07-23 DIAGNOSIS — M5459 Other low back pain: Secondary | ICD-10-CM

## 2022-07-23 DIAGNOSIS — R262 Difficulty in walking, not elsewhere classified: Secondary | ICD-10-CM

## 2022-07-23 DIAGNOSIS — M5416 Radiculopathy, lumbar region: Secondary | ICD-10-CM

## 2022-07-23 NOTE — Therapy (Signed)
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    Constipation    Diabetes mellitus without complication (HCC)    GERD (gastroesophageal reflux disease)    Hypertension    Hyperthyroidism    Lumbar spondylosis    Osteoarthritis of lumbar spine    Synovitis of hip    Tarlov cyst    Past Surgical History:  Procedure Laterality Date   cataract     ELBOW SURGERY Right    HIP SURGERY     Bilateral THR   TOTAL HIP ARTHROPLASTY     VAGINAL HYSTERECTOMY     Patient Active Problem List   Diagnosis Date Noted   Bruit 11/23/2021   Fall 11/23/2021   Dyslipidemia 09/17/2021   Educated about COVID-19 virus infection 09/22/2020   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   Right hip pain 02/04/2018   Hypertension 02/04/2018   Amnesia 02/04/2018   Transient amnesia 02/03/2018   Aortic insufficiency 10/01/2016   Mitral regurgitation 10/01/2016    PCP: Robyne Peers, MD  REFERRING PROVIDER: Gaynelle Arabian, MD  REFERRING DIAG:  M25.551 (ICD-10-CM) - Pain in right hip  M54.50 (ICD-10-CM) - Low back pain    RATIONALE FOR EVALUATION AND TREATMENT: Rehabilitation  THERAPY DIAG:  Pain in right hip  Other low back pain  Radiculopathy, lumbar region  Difficulty in walking, not elsewhere classified  Other abnormalities of gait and mobility  ONSET DATE: 2019  NEXT MD VISIT:  TBD   SUBJECTIVE:                                                                                                                                                                                           SUBJECTIVE STATEMENT: Pt reports doing a bunch of housework and stubbing her toe on coffee table, now with a lot a swelling along big toe.  PAIN:  Are you having pain? Yes: NPRS scale: 9/10 Pain location: low back Pain description: sharp, aching  PERTINENT HISTORY:  B THA - L THA 2014, R THA 2016, R THA revision 2019; chronic R hip pain, synovitis of hip, lumbar spondylosis/OA, DM-II, HTN, GERD, IBS, chronic diarrhea, bladder and rectal prolapse, hyperthyroidism, aortic insufficieny, aortic and mitral valve disorder, mitral regurgitation, neuropathy, RLS, h/o transient global amnesia  PRECAUTIONS: None  WEIGHT BEARING RESTRICTIONS No  FALLS:  Has patient fallen in last 6 months?  None in past 6 months but 2 bad falls in the past few years  LIVING ENVIRONMENT: Lives with: lives with their spouse Lives in: House/apartment Stairs: Yes: Internal: 14 steps; on right going up and on left going up and External: 1 steps; none and ramp available  - mainly stays on main level of home Has following equipment at home: Single point cane, Walker - 4 wheeled, Grab bars, Ramped entry, and 3-wheel RW  OCCUPATION: Reitired  PLOF: Independent, Needs assistance with homemaking, and Leisure: "[employer]", does exercises in bed before she gets up in the morning  PATIENT GOALS: "Walk better"   OBJECTIVE:   DIAGNOSTIC FINDINGS:  MRI R hip - 02/05/18: Findings compatible with mild synovitis about the patient's right hip arthroplasty. Atrophy of the adductor brevis and magnus muscles bilaterally is worse on the left. Mild edema in the right adductor muscles is likely related to atrophy. No muscle tear is identified. Trace amount of fluid in the right trochanteric bursa compatible with  bursitis. MRI Lumbar spine - 02/05/18: Some increase in the size of a down turning right paracentral protrusion at L4-5. The disc impinges on the descending right L5 root. There is mild to moderate central canal stenosis overall at L4-5.     No change in a shallow disc bulge and small down turning right paracentral protrusion at L3-4. Moderate central canal stenosis and narrowing in the subarticular recesses appear unchanged.    No change in moderate central canal stenosis and narrowing in the subarticular recesses at L2-3 where there is a disc bulge and endplate spur.      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  PATIENT SURVEYS:  FOTO Hip = 44; predicted = 48  SCREENING FOR RED FLAGS: Bowel or bladder incontinence: Yes: h/o prolapsed bladder and rectum Spinal tumors: No Cauda equina syndrome: No Compression fracture: No Abdominal aneurysm: No  COGNITION:  Overall cognitive status: Within functional limits for tasks assessed     SENSATION:  Numb along R hip incisional scar; intermittent tingling down R LE    MUSCLE LENGTH:  Hamstrings: mild tight R>L ITB: mod tight R>L Piriformis: mod tight R>L Hip flexors: mod tight R Quads: mild/mod tight R>L Heelcord: WFL  POSTURE:  Symmetrical hip height and leg length but slight weight shift left   PALPATION: Decreased R hip incisional scar mobility with TTP; Increased muscle tension and TTP in R>L lower lumbar paraspinals and R>L glutes  LUMBAR ROM:   Active  A/PROM  Eval - 06/26/22  Flexion Fingertips to ankles  Extension 25% limited  Right lateral flexion Hand to mid lower leg  Left lateral flexion Hand to lateral knee  Right rotation 25% limited   Left rotation 25% limited - pain   (Blank rows = not tested)  LOWER EXTREMITY ROM:     WFL with exceptions of limited hip IR/ER R>L  LOWER EXTREMITY MMT:    MMT Right Eval 06/26/22 Left  Eval 06/26/22  Hip flexion 4- * 4-   Hip extension 2+ 3+  Hip abduction 3+ 4-  Hip adduction 3- 4  Hip internal rotation 4- 4-  Hip external rotation 3- 3-  Knee flexion 4+ 5  Knee extension 4+ 5  Ankle dorsiflexion 4- 4  Ankle plantarflexion 4- 8 SL HR 4 11 SL HR  Ankle inversion    Ankle eversion     (Blank rows = not tested) *-pain with resistance  LUMBAR SPECIAL TESTS:  Straight leg raise test: + on R and FABER test: + on R     FUNCTIONAL TESTS: (completed w/o SPC) 5 times sit to stand: 19.06 sec; >15 sec indicates risk for recurrent falls Timed up and go (TUG): 15.75 sec; >13.5 sec indicates high fall risk 10 meter walk test: 11.69 sec - 2.81 ft sec (community ambulator)  Functional gait assessment: 15/30; < 19 = high risk fall      GAIT: Distance walked: 2 x 50 ft Assistive device utilized: Single point cane Level of assistance: SBA/supervision Gait pattern: step through pattern, decreased stance time- Right, decreased stride length, and antalgic on R with slight L Trendelenburg drop during R stance Comments: Cues necessary for proper sequencing of SPC using cane in L hand to offset issues with R hip Stairs: 14 steps with alternating pattern with single R rail support - increased effort noted with LE lift and rise to next step on ascent and slight substitution with lateral pelvic/hip drop with fwd rotation on descent   TODAY'S TREATMENT  07/23/22 Therapeutic Exercise: NuStep L5 x 6 min 3 way seated flexion stretch w/ green pball x 30 sec each Standing ext with arms on wall 3x5" Seated figure 4 stretch x 30 sec each side  Manual Therapy: STM and IASTM to B lumbar PS, QL, glutes - very tender along R QL  Hot pack x 10 min post session to low back   07/19/22  THERAPEUTIC EXERCISE: to improve flexibility, strength and mobility.  Verbal and tactile cues throughout for technique. NuStep L5 x 6 min  SELF CARE:  Reviewed the "Check for Safety - Home Fall Prevention Checklist for Older Adults" to help  identify fall risk hazards in the home along with strategies to reduce fall risk at home Provided education in proper posture and body mechanics for typical daily positioning and household chores to minimize strain on low back.  THERAPEUTIC ACTIVITIES: TUG = 12.03 sec 5xSTS = 17.0 sec   07/17/22 THERAPEUTIC EXERCISE: to improve flexibility, strength and mobility.  Verbal and tactile cues throughout for technique. NuStep L5 x 6 min Seated B KTOS piriformis stretch 2 x 30" Seated R figure-4 hip hinge piriformis stretch x 30" Lumbar extension in standing 10 x 3-5" Demonstrated POE extension and prone press-ups but not attempted  MANUAL THERAPY: To promote normalized muscle tension, improved flexibility, and reduced pain. Skilled palpation and monitoring of soft tissue during DN Trigger Point Dry-Needling  Treatment instructions: Expect mild to moderate muscle soreness. Patient verbalized understanding of these instructions and education. Patient Consent Given: Yes Education handout provided: Previously provided Muscles treated: L glute medius and minimus, medial and lateral piriformis Electrical stimulation performed: No Parameters: N/A Treatment response/outcome: Twitch Response Elicited and Palpable Increase in Muscle Length SMT/DTM, manual TPR and pin & stretch to muscles addressed with DN   07/10/22 Therapeutic Exercise: Nustep [id number] Review of fall prevention & safety handout Squats x 10 Standing hip ABD, march, ext x 20 Golfers lifts x 10  bil - decreased ROM required supported on R LE Supine bridge with legs straight x 10 LTR with feet bent over x 10 bil Supine R KTOS stretch x 30 sec hold   PATIENT EDUCATION:  Education details: progress with PT, posture and body mechanics for typical daily postioning, mobility and household tasks, and Check for Safety - Home Fall Prevention Checklist for Older Adults  Person educated: Patient Education method: Explanation,  Demonstration, Verbal cues, and Handouts Education comprehension: verbalized understanding   HOME EXERCISE PROGRAM: Access Code: [email or web address] URL: [email or web address] Date: 07/19/2022 Prepared by: Annie Paras  Exercises - Hooklying Hamstring Stretch with Strap  - 2-3 x daily - 7 x weekly - 3 reps - 30 sec hold - Supine ITB Stretch with Strap  - 2-3 x daily - 7 x weekly - 3 reps - 30 sec hold - Supine Hip Adductor Stretch  - 2-3 x daily - 7 x weekly - 3 reps - 30 sec hold - Supine Piriformis Stretch with Foot on Ground  - 2-3 x daily - 7 x weekly - 3 reps - 30 sec hold - Modified Thomas Stretch  - 2-3 x daily - 7 x weekly - 3 reps - 30 sec hold - Supine Lower Trunk Rotation  - 2-3 x daily - 7 x weekly - 5 reps - 10-30 sec hold - Supine Posterior Pelvic Tilt  - 1 x daily - 7 x weekly - 2 sets - 10 reps - 3 sec hold - Supine Bridge  - 1 x daily - 7 x weekly - 2 sets - 10 reps - Supine Hip Adduction Isometric with Ball  - 1 x daily - 7 x weekly - 2 sets - 10 reps - 3 sec hold - Hooklying Clamshell with Resistance  - 1 x daily - 7 x weekly - 2 sets - 10 reps - 3 sec hold  Patient Education - Check for Safety - Posture and Body Mechanics  ASSESSMENT:  CLINICAL IMPRESSION: Pt presented today w/ increased pain from what sounds like an increase in activity this weekend. Session focused on MT to decrease muscle spasms and stretches to relax muscles. Tried some MMT but today they seemed to inc pain so we deferred MMT for today. Ended session with hot pack to low back to further address pain and muscle spasm.  OBJECTIVE IMPAIRMENTS: Abnormal gait, decreased activity tolerance, decreased balance, decreased endurance, decreased knowledge of condition, decreased knowledge of use of DME, decreased mobility, difficulty walking, decreased ROM, decreased strength, decreased safety awareness, increased fascial restrictions, impaired perceived functional ability, increased muscle spasms,  impaired flexibility, impaired sensation, improper body mechanics, postural dysfunction, and pain.   ACTIVITY LIMITATIONS: carrying, lifting, bending, standing, squatting, sleeping, stairs, transfers, bed mobility, and locomotion level  PARTICIPATION LIMITATIONS: meal prep, cleaning, laundry, driving, shopping, community activity, and yard work  PERSONAL FACTORS: Age, Fitness, Past/current experiences, Time since onset of injury/illness/exacerbation, and 3+ comorbidities: B THA - L THA 2014, R THA 2016, R THA revision 2019; chronic R hip pain, synovitis of hip, lumbar spondylosis/OA, DM-II, HTN, GERD, IBS, chronic diarrhea, bladder and rectal prolapse, hyperthyroidism, aortic insufficieny, aortic and mitral valve disorder, mitral regurgitation, neuropathy, RLS, h/o transient global amnesia  are also affecting patient's functional outcome.   REHAB POTENTIAL: Good  CLINICAL DECISION MAKING: Evolving/moderate complexity  EVALUATION COMPLEXITY: Moderate   GOALS: Goals reviewed with patient? Yes  SHORT TERM GOALS: Target date: 07/24/2022   Patient will be independent with initial HEP.  Baseline:  Goal status:  MET  07/19/22  2.  Patient will report centralization of radicular symptoms.  Baseline:  Goal status: MET  07/19/22  3.  Patient will verbalize understanding of fall prevention measures in home to reduce risk for falls Baseline:  Goal status: MET  07/19/22  4.  Patient will demonstrate decreased TUG time to </= 13.5 sec to decrease risk for falls with transitional mobility Baseline: 15.75 sec Goal status: MET  07/19/22 - TUG = 12.03  LONG TERM GOALS: Target date: 08/21/2022    Patient will be independent with advanced/ongoing HEP to improve outcomes and carryover.  Baseline:  Goal status: IN PROGRESS  2.  Patient will report 50-75% improvement in low back pain to improve QOL.  Baseline:  Goal status: IN PROGRESS  3.  Patient will report at least 50-75% improvement in R hip pain to  improve QOL. Baseline:  Goal status: IN PROGRESS  4.  Patient to demonstrate ability to achieve and maintain good spinal alignment/posturing and body mechanics needed for daily activities. Baseline:  Goal status: IN PROGRESS  5.  Patient will demonstrate full pain free lumbar ROM to perform ADLs.   Baseline:  Goal status: IN PROGRESS  6.  Patient will demonstrate improved B LE strength to >/= 4+/5 for improved stability and ease of mobility . Baseline:  Goal status: IN PROGRESS  7.  Patient will report 48 on hip FOTO to demonstrate improved functional ability.  Baseline: 44 Goal status: IN PROGRESS   8.  Patient will tolerate 20-30 min of standing and/or walking to perform chores, cooking or grocery shopping. Baseline:  Goal status: IN PROGRESS  9.  Patient will demonstrate at least 19/30 on FGA to decrease risk of falls. Baseline: 15/30 Goal status: REVISED  10.  Patient will improve 5x STS time to </= 15 seconds to demonstrate improved functional strength and transfer efficiency  Baseline: 19.06 sec Goal status: IN PROGRESS  07/19/22 - 5xSTS = 17.0 sec   PLAN: PT FREQUENCY: 2x/week  PT DURATION: 8 weeks  PLANNED INTERVENTIONS: Therapeutic exercises, Therapeutic activity, Neuromuscular re-education, Balance training, Gait training, Patient/Family education, Joint mobilization, Stair training, DME instructions, Dry Needling, Electrical stimulation, Spinal mobilization, Cryotherapy, Moist heat, scar mobilization, Taping, Traction, Ultrasound, Ionotophoresis 72m/ml Dexamethasone, Manual therapy, and Re-evaluation.  PLAN FOR NEXT SESSION: reassess LE MMT to ensure targeted strengthening for areas of ongoing weakness; progress lumbopelvic flexibility and strengthening as tolerated - review/update HEP as indicated; MT +/- DN to address abnormal muscle tension/tightness and pain in B lumbar paraspinals and glutes; modalities PRN for pain   BArtist Pais PTA 07/23/2022, 9:32 AM

## 2022-07-26 ENCOUNTER — Encounter: Payer: Self-pay | Admitting: Physical Therapy

## 2022-07-26 ENCOUNTER — Ambulatory Visit: Payer: PPO | Admitting: Physical Therapy

## 2022-07-26 DIAGNOSIS — M25551 Pain in right hip: Secondary | ICD-10-CM

## 2022-07-26 DIAGNOSIS — M5459 Other low back pain: Secondary | ICD-10-CM

## 2022-07-26 DIAGNOSIS — R2689 Other abnormalities of gait and mobility: Secondary | ICD-10-CM

## 2022-07-26 DIAGNOSIS — M6281 Muscle weakness (generalized): Secondary | ICD-10-CM

## 2022-07-26 DIAGNOSIS — M5416 Radiculopathy, lumbar region: Secondary | ICD-10-CM

## 2022-07-26 DIAGNOSIS — R262 Difficulty in walking, not elsewhere classified: Secondary | ICD-10-CM

## 2022-07-26 NOTE — Therapy (Addendum)
OUTPATIENT PHYSICAL THERAPY  TREATMENT / PROGRESS NOTE   Patient Name: Shelby Robinson MRN: [id number] DOB:1946/11/05, 76 y.o., female Today's Date: 07/26/2022  Progress Note  Reporting Period 06/26/2022 to 07/26/2022  See note below for Objective Data and Assessment of Progress/Goals.      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    Constipation    Diabetes mellitus without complication (HCC)    GERD (gastroesophageal reflux disease)    Hypertension    Hyperthyroidism    Lumbar spondylosis    Osteoarthritis of lumbar spine    Synovitis of hip    Tarlov cyst    Past Surgical History:  Procedure Laterality Date   cataract     ELBOW SURGERY Right    HIP SURGERY     Bilateral THR   TOTAL HIP ARTHROPLASTY     VAGINAL HYSTERECTOMY     Patient Active Problem List   Diagnosis Date Noted   Bruit 11/23/2021   Fall 11/23/2021   Dyslipidemia 09/17/2021   Educated about COVID-19 virus infection 09/22/2020   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   Right hip pain 02/04/2018   Hypertension 02/04/2018   Amnesia 02/04/2018   Transient amnesia 02/03/2018   Aortic insufficiency 10/01/2016   Mitral regurgitation 10/01/2016    PCP: Robyne Peers, MD  REFERRING PROVIDER: Gaynelle Arabian, MD  REFERRING DIAG:  M25.551 (ICD-10-CM) - Pain in right hip  M54.50 (ICD-10-CM) - Low back pain    RATIONALE FOR EVALUATION AND TREATMENT: Rehabilitation  THERAPY DIAG:  Pain in right hip  Other low back  pain  Radiculopathy, lumbar region  Difficulty in walking, not elsewhere classified  Other abnormalities of gait and mobility  Muscle weakness (generalized)  ONSET DATE: 2019  NEXT MD VISIT: TBD   SUBJECTIVE:  SUBJECTIVE STATEMENT: Pt reports she feels better today than last visit. She notes that hip pain that first brought her to PT seems to be mostly resolved but still having issues with LBP, although incorporating the posture and body mechanics training does help.  PAIN:  Are you having pain? Yes: NPRS scale:  5/10 Pain location: low back Pain description: real, real achy  PERTINENT HISTORY:  B THA - L THA 2014, R THA 2016, R THA revision 2019; chronic R hip pain, synovitis of hip, lumbar spondylosis/OA, DM-II, HTN, GERD, IBS, chronic diarrhea, bladder and rectal prolapse, hyperthyroidism, aortic insufficieny, aortic and mitral valve disorder, mitral regurgitation, neuropathy, RLS, h/o transient global amnesia  PRECAUTIONS: None  WEIGHT BEARING RESTRICTIONS No  FALLS:  Has patient fallen in last 6 months?  None in past 6 months but 2 bad falls in the past few years  LIVING ENVIRONMENT: Lives with: lives with their spouse Lives in: House/apartment Stairs: Yes: Internal: 14 steps; on right going up and on left going up and External: 1 steps; none and ramp available  - mainly stays on main level of home Has following equipment at home: Single point cane, Walker - 4 wheeled, Grab bars, Ramped entry, and 3-wheel RW  OCCUPATION: Reitired  PLOF: Independent, Needs assistance with homemaking, and Leisure: "[employer]", does exercises in bed before she gets up in the morning  PATIENT GOALS: "Walk better"   OBJECTIVE:   DIAGNOSTIC FINDINGS:  MRI R hip - 02/05/18: Findings compatible  with mild synovitis about the patient's right hip arthroplasty. Atrophy of the adductor brevis and magnus muscles bilaterally is worse on the left. Mild edema in the right adductor muscles is likely related to atrophy. No muscle tear is identified. Trace amount of fluid in the right trochanteric bursa compatible with bursitis. MRI Lumbar spine - 02/05/18: Some increase in the size of a down turning right paracentral protrusion at L4-5. The disc impinges on the descending right L5 root. There is mild to moderate central canal stenosis overall at L4-5.     No change in a shallow disc bulge and small down turning right paracentral protrusion at L3-4. Moderate central canal stenosis and narrowing in the subarticular recesses appear unchanged.    No change in moderate central canal stenosis and narrowing in the subarticular recesses at L2-3 where there is a disc bulge and endplate spur.      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  PATIENT SURVEYS:  FOTO Hip = 44; predicted = 48  SCREENING FOR RED FLAGS: Bowel or bladder incontinence: Yes: h/o prolapsed bladder and rectum Spinal tumors: No Cauda equina syndrome: No Compression fracture: No Abdominal aneurysm: No  COGNITION:  Overall cognitive status: Within functional limits for tasks assessed     SENSATION:  Numb along R hip incisional scar; intermittent tingling down R LE    MUSCLE LENGTH:  Hamstrings: mild tight R>L ITB: mod tight R>L Piriformis: mod tight R>L Hip flexors: mod tight R Quads: mild/mod tight R>L Heelcord: WFL  POSTURE:  Symmetrical hip height and leg length but slight weight shift left   PALPATION: Decreased R hip incisional scar mobility with TTP; Increased muscle tension and TTP in R>L lower lumbar paraspinals and R>L glutes  LUMBAR ROM:   Active  AROM  Eval - 06/26/22 AROM 07/26/22  Flexion Fingertips to ankles Fingertips to floor  Extension 25% limited WFL   Right lateral flexion Hand to mid lower leg WFL - increased  pain  Left lateral flexion Hand to lateral knee WFL - tight  Right rotation 25% limited  WFL - R hip discomfort  Left rotation 25% limited - pain WFL   (Blank rows = not tested)  LOWER EXTREMITY ROM:     WFL with exceptions of limited hip IR/ER R>L  LOWER EXTREMITY MMT:    MMT Right Eval 06/26/22 Left Eval 06/26/22  Right 07/26/22  Left 07/26/22  Hip flexion 4- * 4- 4 * 4  Hip extension 2+ 3+ 4- 4-  Hip abduction 3+ 4- 4 4  Hip adduction 3- 4 4 4   Hip internal rotation 4- 4- 4+ 4+  Hip external rotation 3- 3- 3- 3-  Knee flexion 4+ 5 4+ 5  Knee extension 4+ 5 5 5   Ankle dorsiflexion 4- 4 4 4   Ankle plantarflexion 4- 8 SL HR 4 11 SL HR 4+ 15 SL HR 5 20 SL HR  Ankle inversion      Ankle eversion       (Blank rows = not tested) *-pain with resistance  LUMBAR SPECIAL TESTS:  Straight leg raise test: + on R and FABER test: + on R     FUNCTIONAL TESTS: (completed w/o SPC) 5 times sit to stand: 19.06 sec; >15 sec indicates risk for recurrent falls Timed up and go (TUG): 15.75 sec; >13.5 sec indicates high fall risk 10 meter walk test: 11.69 sec - 2.81 ft sec (community ambulator)  Functional gait assessment: 15/30; < 19 = high risk fall      GAIT: Distance walked: 2 x 50 ft Assistive device utilized: Single point cane Level of assistance: SBA/supervision Gait pattern: step through pattern, decreased stance time- Right, decreased stride length, and antalgic on R with slight L Trendelenburg drop during R stance Comments: Cues necessary for proper sequencing of SPC using cane in L hand to offset issues with R hip Stairs: 14 steps with alternating pattern with single R rail support - increased effort noted with LE lift and rise to next step on ascent and slight substitution with lateral pelvic/hip drop with fwd rotation on descent   TODAY'S TREATMENT   07/26/22  THERAPEUTIC EXERCISE: to improve flexibility,  strength and mobility.  Verbal and tactile cues throughout for technique. NuStep L5 x 6 min  THERAPEUTIC ACTIVITIES: ROM & MMT assessment 5xSTS: 14.25 sec FGA: 18/30; < 19 = high risk fall      07/23/22 Therapeutic Exercise: NuStep L5 x 6 min 3 way seated flexion stretch w/ green pball x 30 sec each Standing ext with arms on wall 3x5" Seated figure 4 stretch x 30 sec each side  Manual Therapy: STM and IASTM to B lumbar PS, QL, glutes - very tender along R QL  Hot pack x 10 min post session to low back    07/19/22  THERAPEUTIC EXERCISE: to improve flexibility, strength and mobility.  Verbal and tactile cues throughout for technique. NuStep L5 x 6 min  SELF CARE:  Reviewed the "Check for Safety - Home Fall Prevention Checklist for Older Adults" to help identify fall risk hazards in the home along with strategies to reduce fall risk at home Provided education in proper posture and body mechanics for typical daily positioning and household chores to minimize strain on low back.  THERAPEUTIC ACTIVITIES: TUG = 12.03 sec 5xSTS = 17.0 sec    PATIENT EDUCATION:  Education details: progress with PT and ongoing PT POC Person educated: Patient Education method: Explanation Education comprehension: verbalized understanding  HOME EXERCISE PROGRAM: Access Code: [email or web address] URL: [email or web address] Date: 07/19/2022 Prepared by: Annie Paras  Exercises - Hooklying Hamstring Stretch with Strap  - 2-3 x daily - 7 x weekly - 3 reps - 30 sec hold - Supine ITB Stretch with Strap  - 2-3 x daily - 7 x weekly - 3 reps - 30 sec hold - Supine Hip Adductor Stretch  - 2-3 x daily - 7 x weekly - 3 reps - 30 sec hold - Supine Piriformis Stretch with Foot on Ground  - 2-3 x daily - 7 x weekly - 3 reps - 30 sec hold - Modified Thomas Stretch  - 2-3 x daily - 7 x weekly - 3 reps - 30 sec hold - Supine Lower Trunk Rotation  - 2-3 x daily - 7 x weekly - 5 reps - 10-30 sec hold - Supine  Posterior Pelvic Tilt  - 1 x daily - 7 x weekly - 2 sets - 10 reps - 3 sec hold - Supine Bridge  - 1 x daily - 7 x weekly - 2 sets - 10 reps - Supine Hip Adduction Isometric with Ball  - 1 x daily - 7 x weekly - 2 sets - 10 reps - 3 sec hold - Hooklying Clamshell with Resistance  - 1 x daily - 7 x weekly - 2 sets - 10 reps - 3 sec hold  Patient Education - Check for Safety - Posture and Body Mechanics  ASSESSMENT:  CLINICAL IMPRESSION: Marolyn reports her pain continues to fluctuate, mostly based on her activity level, but notes 50% improvement in R hip pain and 20-30% improvement in LBP. Hip FOTO goal (LTG #7) now met. Lumbar ROM now [facility], although some discomfort still noted with some motions as detailed above. Gains noted in B LE strength with overall MMT now >/= 4-/5 (with only exception of B hip ER 3-/5) and improving symmetry noted between left and right LE. She has also demonstrated good progress with all standardized balance tests with 5xSTS goal (LTG #10) now met. She continues to note some limitations in activity tolerance, now mostly related to LBP, but reports improvement in standing and walking tolerance to at least 30 min (LTG #8 now met). Madelynne is progressing well with PT with all STGs and above LTGs met and progress demonstrated toward all remaining LTGs. She will continue to benefit from skilled PT to further improve stability and activity tolerance with decrease pain interference.  OBJECTIVE IMPAIRMENTS: Abnormal gait, decreased activity tolerance, decreased balance, decreased endurance, decreased knowledge of condition, decreased knowledge of use of DME, decreased mobility, difficulty walking, decreased ROM, decreased strength, decreased safety awareness, increased fascial restrictions, impaired perceived functional ability, increased muscle spasms, impaired flexibility, impaired sensation, improper body mechanics, postural dysfunction, and pain.   ACTIVITY LIMITATIONS: carrying,  lifting, bending, standing, squatting, sleeping, stairs, transfers, bed mobility, and locomotion level  PARTICIPATION LIMITATIONS: meal prep, cleaning, laundry, driving, shopping, community activity, and yard work  PERSONAL FACTORS: Age, Fitness, Past/current experiences, Time since onset of injury/illness/exacerbation, and 3+ comorbidities: B THA - L THA 2014, R THA 2016, R THA revision 2019; chronic R hip pain, synovitis of hip, lumbar spondylosis/OA, DM-II, HTN, GERD, IBS, chronic diarrhea, bladder and rectal prolapse, hyperthyroidism, aortic insufficieny, aortic and mitral valve disorder, mitral regurgitation, neuropathy, RLS, h/o transient global amnesia  are also affecting patient's functional outcome.   REHAB POTENTIAL: Good  CLINICAL DECISION MAKING: Evolving/moderate complexity  EVALUATION COMPLEXITY: Moderate   GOALS: Goals reviewed with patient? Yes  SHORT TERM GOALS: Target date: 07/24/2022   Patient will be independent with initial HEP.  Baseline:  Goal status: MET  07/19/22  2.  Patient will report centralization of radicular symptoms.  Baseline:  Goal status: MET  07/19/22  3.  Patient will verbalize understanding of fall prevention measures in home to reduce risk for falls Baseline:  Goal status: MET  07/19/22  4.  Patient will demonstrate decreased TUG time to </= 13.5 sec to decrease risk for falls with transitional mobility Baseline: 15.75 sec Goal status: MET  07/19/22 - TUG = 12.03  LONG TERM GOALS: Target date: 08/21/2022    Patient will be independent with advanced/ongoing HEP to improve outcomes and carryover.  Baseline:  Goal status: IN PROGRESS  2.  Patient will report 50-75% improvement in low back pain to improve QOL.  Baseline:  Goal status: IN PROGRESS  07/26/22 - Pt reports 20-30% improvement.  3.  Patient will report at least 50-75% improvement in R hip pain to improve QOL. Baseline:  Goal status: IN PROGRESS  07/26/22 - Pt reports 50%  improvement.  4.  Patient to demonstrate ability to achieve and maintain good spinal alignment/posturing and body mechanics needed for daily activities. Baseline:  Goal status: IN PROGRESS    5.  Patient will demonstrate full pain free lumbar ROM to perform ADLs.   Baseline:  Goal status: IN PROGRESS  07/26/22 - Lumbar ROM WFL/WNL but still some pain with certain motions  6.  Patient will demonstrate improved B LE strength to >/= 4+/5 for improved stability and ease of mobility . Baseline: Refer to above MMT chart Goal status: IN PROGRESS  07/26/22 - Met for knee and ankles, hips partially   7.  Patient will report 48 on hip FOTO to demonstrate improved functional ability.  Baseline: 44 Goal status: MET  07/26/22 - Hip FOTO = 50   8.  Patient will tolerate 20-30 min of standing and/or walking to perform chores, cooking or grocery shopping. Baseline:  Goal status: MET  07/26/29 - Pt reports she is able to stand or walk for 30+ minutes  9.  Patient will demonstrate at least 19/30 on FGA to decrease risk of falls. Baseline: 15/30 (eval); 18/30 (07/26/22) Goal status: IN PROGRESS  07/26/22 - FGA = 18/30  10.  Patient will improve 5x STS time to </= 15 seconds to demonstrate improved functional strength and transfer efficiency  Baseline: 19.06 sec (eval); 17.0 sec (07/19/22); 14.25 (07/26/22) Goal status: MET  07/26/22 - 5xSTS = 14.25 sec   PLAN: PT FREQUENCY: 2x/week  PT DURATION: 8 weeks  PLANNED INTERVENTIONS: Therapeutic exercises, Therapeutic activity, Neuromuscular re-education, Balance training, Gait training, Patient/Family education, Joint mobilization, Stair training, DME instructions, Dry Needling, Electrical stimulation, Spinal mobilization, Cryotherapy, Moist heat, scar mobilization, Taping, Traction, Ultrasound, Ionotophoresis 71m/ml Dexamethasone, Manual therapy, and Re-evaluation.  PLAN FOR NEXT SESSION: progress lumbopelvic/LE flexibility and strengthening as tolerated -  review/update HEP as indicated; MT +/- DN to address abnormal muscle tension/tightness and pain in B lumbar paraspinals and glutes; modalities PRN for pain   JPercival Spanish PT 07/26/2022, 11:44 AM

## 2022-07-30 ENCOUNTER — Ambulatory Visit: Payer: PPO

## 2022-07-30 DIAGNOSIS — R2689 Other abnormalities of gait and mobility: Secondary | ICD-10-CM

## 2022-07-30 DIAGNOSIS — R262 Difficulty in walking, not elsewhere classified: Secondary | ICD-10-CM

## 2022-07-30 DIAGNOSIS — M25551 Pain in right hip: Secondary | ICD-10-CM

## 2022-07-30 DIAGNOSIS — M5416 Radiculopathy, lumbar region: Secondary | ICD-10-CM

## 2022-07-30 DIAGNOSIS — M5459 Other low back pain: Secondary | ICD-10-CM

## 2022-07-30 NOTE — Therapy (Signed)
OUTPATIENT PHYSICAL THERAPY  TREATMENT    Patient Name: Shelby Robinson MRN: [id number] DOB:28-May-1946, 76 y.o., female Today's Date: 07/30/2022       PT End of Session - 07/30/22 0849     Visit Number 10    Date for PT Re-Evaluation 08/21/22    Authorization Type [employer]    Progress Note Due on Visit 19   PN completed on visit #9 - 07/26/22   PT Start Time 0845    PT Stop Time 0931    PT Time Calculation (min) 46 min    Activity Tolerance Patient tolerated treatment well    Behavior During Therapy WFL for tasks assessed/performed                      Past Medical History:  Diagnosis Date   B12 deficiency    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    Constipation    Diabetes mellitus without complication (HCC)    GERD (gastroesophageal reflux disease)    Hypertension    Hyperthyroidism    Lumbar spondylosis    Osteoarthritis of lumbar spine    Synovitis of hip    Tarlov cyst    Past Surgical History:  Procedure Laterality Date   cataract     ELBOW SURGERY Right    HIP SURGERY     Bilateral THR   TOTAL HIP ARTHROPLASTY     VAGINAL HYSTERECTOMY     Patient Active Problem List   Diagnosis Date Noted   Bruit 11/23/2021   Fall 11/23/2021   Dyslipidemia 09/17/2021   Educated about COVID-19 virus infection 09/22/2020   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   Right hip pain 02/04/2018   Hypertension 02/04/2018   Amnesia 02/04/2018   Transient amnesia 02/03/2018   Aortic insufficiency 10/01/2016   Mitral regurgitation 10/01/2016    PCP: Robyne Peers, MD  REFERRING PROVIDER: Gaynelle Arabian, MD  REFERRING DIAG:  M25.551 (ICD-10-CM) - Pain in right hip  M54.50 (ICD-10-CM) - Low back pain    RATIONALE FOR EVALUATION AND TREATMENT: Rehabilitation  THERAPY DIAG:  Pain in right hip  Other low back pain  Radiculopathy, lumbar region  Difficulty in walking, not elsewhere classified  Other abnormalities of gait and  mobility  ONSET DATE: 2019  NEXT MD VISIT: TBD   SUBJECTIVE:                                                                                                                                                                                           SUBJECTIVE STATEMENT: Pt reports over the weekend she woke up  one morning feeling weak in legs, after doing HEP this all went away.  PAIN:  Are you having pain? Yes: NPRS scale: 3-4/10 Pain location: low back Pain description: real, real achy  PERTINENT HISTORY:  B THA - L THA 2014, R THA 2016, R THA revision 2019; chronic R hip pain, synovitis of hip, lumbar spondylosis/OA, DM-II, HTN, GERD, IBS, chronic diarrhea, bladder and rectal prolapse, hyperthyroidism, aortic insufficieny, aortic and mitral valve disorder, mitral regurgitation, neuropathy, RLS, h/o transient global amnesia  PRECAUTIONS: None  WEIGHT BEARING RESTRICTIONS No  FALLS:  Has patient fallen in last 6 months?  None in past 6 months but 2 bad falls in the past few years  LIVING ENVIRONMENT: Lives with: lives with their spouse Lives in: House/apartment Stairs: Yes: Internal: 14 steps; on right going up and on left going up and External: 1 steps; none and ramp available  - mainly stays on main level of home Has following equipment at home: Single point cane, Walker - 4 wheeled, Grab bars, Ramped entry, and 3-wheel RW  OCCUPATION: Reitired  PLOF: Independent, Needs assistance with homemaking, and Leisure: "[employer]", does exercises in bed before she gets up in the morning  PATIENT GOALS: "Walk better"   OBJECTIVE:   DIAGNOSTIC FINDINGS:  MRI R hip - 02/05/18: Findings compatible with mild synovitis about the patient's right hip arthroplasty. Atrophy of the adductor brevis and magnus muscles bilaterally is worse on the left. Mild edema in the right adductor muscles is likely related to atrophy. No muscle tear is identified. Trace amount of fluid in the right  trochanteric bursa compatible with bursitis. MRI Lumbar spine - 02/05/18: Some increase in the size of a down turning right paracentral protrusion at L4-5. The disc impinges on the descending right L5 root. There is mild to moderate central canal stenosis overall at L4-5.     No change in a shallow disc bulge and small down turning right paracentral protrusion at L3-4. Moderate central canal stenosis and narrowing in the subarticular recesses appear unchanged.    No change in moderate central canal stenosis and narrowing in the subarticular recesses at L2-3 where there is a disc bulge and endplate spur.      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  PATIENT SURVEYS:  FOTO Hip = 44; predicted = 48  SCREENING FOR RED FLAGS: Bowel or bladder incontinence: Yes: h/o prolapsed bladder and rectum Spinal tumors: No Cauda equina syndrome: No Compression fracture: No Abdominal aneurysm: No  COGNITION:  Overall cognitive status: Within functional limits for tasks assessed     SENSATION:  Numb along R hip incisional scar; intermittent tingling down R LE    MUSCLE LENGTH:  Hamstrings: mild tight R>L ITB: mod tight R>L Piriformis: mod tight R>L Hip flexors: mod tight R Quads: mild/mod tight R>L Heelcord: WFL  POSTURE:  Symmetrical hip height and leg length but slight weight shift left   PALPATION: Decreased R hip incisional scar mobility with TTP; Increased muscle tension and TTP in R>L lower lumbar paraspinals and R>L glutes  LUMBAR ROM:   Active  AROM  Eval - 06/26/22 AROM 07/26/22  Flexion Fingertips to ankles Fingertips to floor  Extension 25% limited WFL  Right lateral flexion Hand to mid lower leg WFL - increased pain  Left lateral flexion Hand to lateral knee WFL - tight  Right rotation 25% limited  WFL - R hip discomfort  Left rotation 25% limited - pain WFL   (  Blank rows = not tested)  LOWER EXTREMITY ROM:     WFL with  exceptions of limited hip IR/ER R>L  LOWER EXTREMITY MMT:    MMT Right Eval 06/26/22 Left Eval 06/26/22  Right 07/26/22  Left 07/26/22  Hip flexion 4- * 4- 4 * 4  Hip extension 2+ 3+ 4- 4-  Hip abduction 3+ 4- 4 4  Hip adduction 3- _0 Hip internal rotation 4- 4- 4+ 4+  Hip external rotation 3- 3- 3- 3-  Knee flexion 4+ 5 4+ 5  Knee extension 4+ _1 Ankle dorsiflexion 4- _2 Ankle plantarflexion 4- 8 SL HR 4 11 SL HR 4+ 15 SL HR 5 20 SL HR  Ankle inversion      Ankle eversion       (Blank rows = not tested) *-pain with resistance  LUMBAR SPECIAL TESTS:  Straight leg raise test: + on R and FABER test: + on R     FUNCTIONAL TESTS: (completed w/o SPC) 5 times sit to stand: 19.06 sec; >15 sec indicates risk for recurrent falls Timed up and go (TUG): 15.75 sec; >13.5 sec indicates high fall risk 10 meter walk test: 11.69 sec - 2.81 ft sec (community ambulator)  Functional gait assessment: 15/30; < 19 = high risk fall      GAIT: Distance walked: 2 x 50 ft Assistive device utilized: Single point cane Level of assistance: SBA/supervision Gait pattern: step through pattern, decreased stance time- Right, decreased stride length, and antalgic on R with slight L Trendelenburg drop during R stance Comments: Cues necessary for proper sequencing of SPC using cane in L hand to offset issues with R hip Stairs: 14 steps with alternating pattern with single R rail support - increased effort noted with LE lift and rise to next step on ascent and slight substitution with lateral pelvic/hip drop with fwd rotation on descent   TODAY'S TREATMENT  07/30/22 Nustep L5x37mn Standing open book at wall x 10 bil Standing lateral bends at wall 5x bil Standing trunk rotation w/ red TB x 10 bil Standing row and shld ext red TB 2x10 each Multifus walkout w/ red TB x 10 - unsteady going to L side Bridges x 15  Manual Therapy: STM to R lumbar PS, QL - very tender along upper lumbar  PS  07/19/22  THERAPEUTIC EXERCISE: to improve flexibility, strength and mobility.  Verbal and tactile cues throughout for technique. NuStep L5 x 6 min  THERAPEUTIC ACTIVITIES: ROM & MMT assessment 5xSTS: 14.25 sec FGA: 18/30; < 19 = high risk fall      07/23/22 Therapeutic Exercise: NuStep L5 x 6 min 3 way seated flexion stretch w/ green pball x 30 sec each Standing ext with arms on wall 3x5" Seated figure 4 stretch x 30 sec each side  Manual Therapy: STM and IASTM to B lumbar PS, QL, glutes - very tender along R QL  Hot pack x 10 min post session to low back        PATIENT EDUCATION:  Education details: progress with PT and ongoing PT POC Person educated: Patient Education method: Explanation Education comprehension: verbalized understanding   HOME EXERCISE PROGRAM: Access Code: [email or web address]: [email or web address] Date: 07/19/2022 Prepared by: JAnnie Paras Exercises - Hooklying Hamstring Stretch with Strap  - 2-3 x daily - 7 x weekly - 3 reps - 30 sec hold - Supine ITB Stretch with Strap  - 2-3 x daily - 7 x  weekly - 3 reps - 30 sec hold - Supine Hip Adductor Stretch  - 2-3 x daily - 7 x weekly - 3 reps - 30 sec hold - Supine Piriformis Stretch with Foot on Ground  - 2-3 x daily - 7 x weekly - 3 reps - 30 sec hold - Modified Thomas Stretch  - 2-3 x daily - 7 x weekly - 3 reps - 30 sec hold - Supine Lower Trunk Rotation  - 2-3 x daily - 7 x weekly - 5 reps - 10-30 sec hold - Supine Posterior Pelvic Tilt  - 1 x daily - 7 x weekly - 2 sets - 10 reps - 3 sec hold - Supine Bridge  - 1 x daily - 7 x weekly - 2 sets - 10 reps - Supine Hip Adduction Isometric with Ball  - 1 x daily - 7 x weekly - 2 sets - 10 reps - 3 sec hold - Hooklying Clamshell with Resistance  - 1 x daily - 7 x weekly - 2 sets - 10 reps - 3 sec hold  Patient Education - Check for Safety - Posture and Body Mechanics  ASSESSMENT:  CLINICAL IMPRESSION: Pt still c/o mild LBP  throughout the day. Progressed ROM focusing on rot and side bend. Pt was unsteady a few times with multifidus walkout going to L side. Able to progress HEP w/ bridges + TB. Finished session with STM to lower back and pt remains tender along L paraspinals. Pt reported after this week she will be out of town.  OBJECTIVE IMPAIRMENTS: Abnormal gait, decreased activity tolerance, decreased balance, decreased endurance, decreased knowledge of condition, decreased knowledge of use of DME, decreased mobility, difficulty walking, decreased ROM, decreased strength, decreased safety awareness, increased fascial restrictions, impaired perceived functional ability, increased muscle spasms, impaired flexibility, impaired sensation, improper body mechanics, postural dysfunction, and pain.   ACTIVITY LIMITATIONS: carrying, lifting, bending, standing, squatting, sleeping, stairs, transfers, bed mobility, and locomotion level  PARTICIPATION LIMITATIONS: meal prep, cleaning, laundry, driving, shopping, community activity, and yard work  PERSONAL FACTORS: Age, Fitness, Past/current experiences, Time since onset of injury/illness/exacerbation, and 3+ comorbidities: B THA - L THA 2014, R THA 2016, R THA revision 2019; chronic R hip pain, synovitis of hip, lumbar spondylosis/OA, DM-II, HTN, GERD, IBS, chronic diarrhea, bladder and rectal prolapse, hyperthyroidism, aortic insufficieny, aortic and mitral valve disorder, mitral regurgitation, neuropathy, RLS, h/o transient global amnesia  are also affecting patient's functional outcome.   REHAB POTENTIAL: Good  CLINICAL DECISION MAKING: Evolving/moderate complexity  EVALUATION COMPLEXITY: Moderate   GOALS: Goals reviewed with patient? Yes  SHORT TERM GOALS: Target date: 07/24/2022   Patient will be independent with initial HEP.  Baseline:  Goal status: MET  07/19/22  2.  Patient will report centralization of radicular symptoms.  Baseline:  Goal status: MET   07/19/22  3.  Patient will verbalize understanding of fall prevention measures in home to reduce risk for falls Baseline:  Goal status: MET  07/19/22  4.  Patient will demonstrate decreased TUG time to </= 13.5 sec to decrease risk for falls with transitional mobility Baseline: 15.75 sec Goal status: MET  07/19/22 - TUG = 12.03  LONG TERM GOALS: Target date: 08/21/2022    Patient will be independent with advanced/ongoing HEP to improve outcomes and carryover.  Baseline:  Goal status: IN PROGRESS  2.  Patient will report 50-75% improvement in low back pain to improve QOL.  Baseline:  Goal status: IN PROGRESS  07/26/22 - Pt  reports 20-30% improvement.  3.  Patient will report at least 50-75% improvement in R hip pain to improve QOL. Baseline:  Goal status: IN PROGRESS  07/26/22 - Pt reports 50% improvement.  4.  Patient to demonstrate ability to achieve and maintain good spinal alignment/posturing and body mechanics needed for daily activities. Baseline:  Goal status: IN PROGRESS    5.  Patient will demonstrate full pain free lumbar ROM to perform ADLs.   Baseline:  Goal status: IN PROGRESS  07/26/22 - Lumbar ROM WFL/WNL but still some pain with certain motions  6.  Patient will demonstrate improved B LE strength to >/= 4+/5 for improved stability and ease of mobility . Baseline: Refer to above MMT chart Goal status: IN PROGRESS  07/26/22 - Met for knee and ankles, hips partially   7.  Patient will report 48 on hip FOTO to demonstrate improved functional ability.  Baseline: 44 Goal status: MET  07/26/22 - Hip FOTO = 50   8.  Patient will tolerate 20-30 min of standing and/or walking to perform chores, cooking or grocery shopping. Baseline:  Goal status: MET  07/26/29 - Pt reports she is able to stand or walk for 30+ minutes  9.  Patient will demonstrate at least 19/30 on FGA to decrease risk of falls. Baseline: 15/30 (eval); 18/30 (07/26/22) Goal status: IN PROGRESS  07/26/22 - FGA =  18/30  10.  Patient will improve 5x STS time to </= 15 seconds to demonstrate improved functional strength and transfer efficiency  Baseline: 19.06 sec (eval); 17.0 sec (07/19/22); 14.25 (07/26/22) Goal status: MET  07/26/22 - 5xSTS = 14.25 sec   PLAN: PT FREQUENCY: 2x/week  PT DURATION: 8 weeks  PLANNED INTERVENTIONS: Therapeutic exercises, Therapeutic activity, Neuromuscular re-education, Balance training, Gait training, Patient/Family education, Joint mobilization, Stair training, DME instructions, Dry Needling, Electrical stimulation, Spinal mobilization, Cryotherapy, Moist heat, scar mobilization, Taping, Traction, Ultrasound, Ionotophoresis 27m/ml Dexamethasone, Manual therapy, and Re-evaluation.  PLAN FOR NEXT SESSION: progress lumbopelvic/LE flexibility and strengthening as tolerated - review/update HEP as indicated; MT +/- DN to address abnormal muscle tension/tightness and pain in B lumbar paraspinals and glutes; modalities PRN for pain   BArtist Pais PTA 07/30/2022, 9:39 AM

## 2022-08-02 ENCOUNTER — Ambulatory Visit: Payer: PPO | Admitting: Physical Therapy

## 2022-08-02 DIAGNOSIS — R2689 Other abnormalities of gait and mobility: Secondary | ICD-10-CM

## 2022-08-02 DIAGNOSIS — M5459 Other low back pain: Secondary | ICD-10-CM

## 2022-08-02 DIAGNOSIS — R262 Difficulty in walking, not elsewhere classified: Secondary | ICD-10-CM

## 2022-08-02 DIAGNOSIS — M5416 Radiculopathy, lumbar region: Secondary | ICD-10-CM

## 2022-08-02 DIAGNOSIS — M25551 Pain in right hip: Secondary | ICD-10-CM | POA: Diagnosis not present

## 2022-08-02 DIAGNOSIS — M6281 Muscle weakness (generalized): Secondary | ICD-10-CM

## 2022-08-02 NOTE — Therapy (Addendum)
OUTPATIENT PHYSICAL THERAPY TREATMENT / DISCHARGE SUMMARY   Patient Name: Shelby Robinson MRN: [id number] DOB:[birth year], 76 y.o., female Today's Date: 08/02/2022       PT End of Session - 08/02/22 1100     Visit Number 11    Date for PT Re-Evaluation 09/27/22    Authorization Type [employer]    Progress Note Due on Visit 19   PN completed on visit #9 - 07/26/22   PT Start Time 1100    PT Stop Time 1151    PT Time Calculation (min) 51 min    Activity Tolerance Patient tolerated treatment well    Behavior During Therapy WFL for tasks assessed/performed                       Past Medical History:  Diagnosis Date   B12 deficiency    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    Constipation    Diabetes mellitus without complication (HCC)    GERD (gastroesophageal reflux disease)    Hypertension    Hyperthyroidism    Lumbar spondylosis    Osteoarthritis of lumbar spine    Synovitis of hip    Tarlov cyst    Past Surgical History:  Procedure Laterality Date   cataract     ELBOW SURGERY Right    HIP SURGERY     Bilateral THR   TOTAL HIP ARTHROPLASTY     VAGINAL HYSTERECTOMY     Patient Active Problem List   Diagnosis Date Noted   Bruit 11/23/2021   Fall 11/23/2021   Dyslipidemia 09/17/2021   Educated about COVID-19 virus infection 09/22/2020   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   Right hip pain 02/04/2018   Hypertension 02/04/2018   Amnesia 02/04/2018   Transient amnesia 02/03/2018   Aortic insufficiency 10/01/2016   Mitral regurgitation 10/01/2016    PCP: Robyne Peers, MD  REFERRING PROVIDER: Gaynelle Arabian, MD  REFERRING DIAG:  M25.551 (ICD-10-CM) - Pain in right hip  M54.50 (ICD-10-CM) - Low back pain    RATIONALE FOR EVALUATION AND TREATMENT: Rehabilitation  THERAPY DIAG:  Pain in right hip  Other low back pain  Radiculopathy, lumbar region  Difficulty in walking, not elsewhere classified  Other  abnormalities of gait and mobility  Muscle weakness (generalized)  ONSET DATE: 2019  NEXT MD VISIT: TBD   SUBJECTIVE:                                                                                                                                                                                           SUBJECTIVE STATEMENT: Pt reports  her pain will still "hit" her (up to 10/10) during some transitional movements but pain will settle back down almost immediately - pain seems to only be in her back these days ns not as much in her hips.  PAIN:  Are you having pain? Yes: NPRS scale: 0/10 currently; 3-4 on average up to 10/10 Pain location: L low back Pain description: real, real achy  PERTINENT HISTORY:  B THA - L THA 2014, R THA 2016, R THA revision 2019; chronic R hip pain, synovitis of hip, lumbar spondylosis/OA, DM-II, HTN, GERD, IBS, chronic diarrhea, bladder and rectal prolapse, hyperthyroidism, aortic insufficieny, aortic and mitral valve disorder, mitral regurgitation, neuropathy, RLS, h/o transient global amnesia  PRECAUTIONS: None  WEIGHT BEARING RESTRICTIONS No  FALLS:  Has patient fallen in last 6 months?  None in past 6 months but 2 bad falls in the past few years  LIVING ENVIRONMENT: Lives with: lives with their spouse Lives in: House/apartment Stairs: Yes: Internal: 14 steps; on right going up and on left going up and External: 1 steps; none and ramp available  - mainly stays on main level of home Has following equipment at home: Single point cane, Walker - 4 wheeled, Grab bars, Ramped entry, and 3-wheel RW  OCCUPATION: Reitired  PLOF: Independent, Needs assistance with homemaking, and Leisure: "[employer]", does exercises in bed before she gets up in the morning  PATIENT GOALS: "Walk better"   OBJECTIVE:   DIAGNOSTIC FINDINGS:  MRI R hip - 02/05/18: Findings compatible with mild synovitis about the patient's right hip arthroplasty. Atrophy of the  adductor brevis and magnus muscles bilaterally is worse on the left. Mild edema in the right adductor muscles is likely related to atrophy. No muscle tear is identified. Trace amount of fluid in the right trochanteric bursa compatible with bursitis. MRI Lumbar spine - 02/05/18: Some increase in the size of a down turning right paracentral protrusion at L4-5. The disc impinges on the descending right L5 root. There is mild to moderate central canal stenosis overall at L4-5.     No change in a shallow disc bulge and small down turning right paracentral protrusion at L3-4. Moderate central canal stenosis and narrowing in the subarticular recesses appear unchanged.    No change in moderate central canal stenosis and narrowing in the subarticular recesses at L2-3 where there is a disc bulge and endplate spur.      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  PATIENT SURVEYS:  FOTO Hip = 44; predicted = 48  SCREENING FOR RED FLAGS: Bowel or bladder incontinence: Yes: h/o prolapsed bladder and rectum Spinal tumors: No Cauda equina syndrome: No Compression fracture: No Abdominal aneurysm: No  COGNITION:  Overall cognitive status: Within functional limits for tasks assessed     SENSATION:  Numb along R hip incisional scar; intermittent tingling down R LE    MUSCLE LENGTH:  Hamstrings: mild tight R>L ITB: mod tight R>L Piriformis: mod tight R>L Hip flexors: mod tight R Quads: mild/mod tight R>L Heelcord: WFL  POSTURE:  Symmetrical hip height and leg length but slight weight shift left   PALPATION: Decreased R hip incisional scar mobility with TTP; Increased muscle tension and TTP in R>L lower lumbar paraspinals and R>L glutes  LUMBAR ROM:   Active  AROM  Eval - 06/26/22 AROM 07/26/22  Flexion Fingertips to ankles Fingertips to floor  Extension 25% limited WFL  Right lateral flexion Hand to mid lower leg WFL -  increased pain  Left  lateral flexion Hand to lateral knee WFL - tight  Right rotation 25% limited  WFL - R hip discomfort  Left rotation 25% limited - pain WFL   (Blank rows = not tested)  LOWER EXTREMITY ROM:     WFL with exceptions of limited hip IR/ER R>L  LOWER EXTREMITY MMT:    MMT Right Eval 06/26/22 Left Eval 06/26/22  Right 07/26/22  Left 07/26/22  Hip flexion 4- * 4- 4 * 4  Hip extension 2+ 3+ 4- 4-  Hip abduction 3+ 4- 4 4  Hip adduction 3- _0 Hip internal rotation 4- 4- 4+ 4+  Hip external rotation 3- 3- 3- 3-  Knee flexion 4+ 5 4+ 5  Knee extension 4+ _1 Ankle dorsiflexion 4- _2 Ankle plantarflexion 4- 8 SL HR 4 11 SL HR 4+ 15 SL HR 5 20 SL HR  Ankle inversion      Ankle eversion       (Blank rows = not tested) *-pain with resistance  LUMBAR SPECIAL TESTS:  Straight leg raise test: + on R and FABER test: + on R     FUNCTIONAL TESTS: (completed w/o SPC) 5 times sit to stand: 19.06 sec; >15 sec indicates risk for recurrent falls Timed up and go (TUG): 15.75 sec; >13.5 sec indicates high fall risk 10 meter walk test: 11.69 sec - 2.81 ft sec (community ambulator)  Functional gait assessment: 15/30; < 19 = high risk fall      GAIT: Distance walked: 2 x 50 ft Assistive device utilized: Single point cane Level of assistance: SBA/supervision Gait pattern: step through pattern, decreased stance time- Right, decreased stride length, and antalgic on R with slight L Trendelenburg drop during R stance Comments: Cues necessary for proper sequencing of SPC using cane in L hand to offset issues with R hip Stairs: 14 steps with alternating pattern with single R rail support - increased effort noted with LE lift and rise to next step on ascent and slight substitution with lateral pelvic/hip drop with fwd rotation on descent   TODAY'S TREATMENT   08/02/22  THERAPEUTIC EXERCISE: to improve flexibility, strength and mobility.  Verbal and tactile cues throughout for technique. Rec  bike L3 x 7 min Quadruped cat/cow x 10 Quadruped alt hip extension x 5 each - cues for neutral spine  MANUAL THERAPY: To promote normalized muscle tension and reduced pain. Skilled palpation and monitoring of soft tissue during DN Trigger Point Dry-Needling  Treatment instructions: Expect mild to moderate muscle soreness. Patient verbalized understanding of these instructions and education. Patient Consent Given: Yes Education handout provided: Previously provided Muscles treated: L glute medius, multifidi and erector spinae Electrical stimulation performed: No Parameters: N/A Treatment response/outcome: Twitch Response Elicited and Palpable Increase in Muscle Length STM/DTM, manual TPR and pin & stretch to muscles addressed with DN  SELF CARE: Provided information on set-up and use of home TENS unit (pt already has a home unit).   07/30/22 Nustep L5x34mn Standing open book at wall x 10 bil Standing lateral bends at wall 5x bil Standing trunk rotation w/ red TB x 10 bil Standing row and shld ext red TB 2x10 each Multifus walkout w/ red TB x 10 - unsteady going to L side Bridges x 15  Manual Therapy: STM to R lumbar PS, QL - very tender along upper lumbar PS   07/26/22  THERAPEUTIC EXERCISE: to improve flexibility, strength and mobility.  Verbal and tactile cues throughout for technique. NuStep L5 x 6 min  THERAPEUTIC ACTIVITIES: ROM & MMT assessment 5xSTS: 14.25 sec FGA: 18/30; < 19 = high risk fall      PATIENT EDUCATION:  Education details: progress with PT and ongoing PT POC Person educated: Patient Education method: Explanation Education comprehension: verbalized understanding   HOME EXERCISE PROGRAM: Access Code: [email or web address] URL: [email or web address] Date: 07/19/2022 Prepared by: Annie Paras  Exercises - Hooklying Hamstring Stretch with Strap  - 2-3 x daily - 7 x weekly - 3 reps - 30 sec hold - Supine ITB Stretch with Strap  - 2-3 x daily - 7  x weekly - 3 reps - 30 sec hold - Supine Hip Adductor Stretch  - 2-3 x daily - 7 x weekly - 3 reps - 30 sec hold - Supine Piriformis Stretch with Foot on Ground  - 2-3 x daily - 7 x weekly - 3 reps - 30 sec hold - Modified Thomas Stretch  - 2-3 x daily - 7 x weekly - 3 reps - 30 sec hold - Supine Lower Trunk Rotation  - 2-3 x daily - 7 x weekly - 5 reps - 10-30 sec hold - Supine Posterior Pelvic Tilt  - 1 x daily - 7 x weekly - 2 sets - 10 reps - 3 sec hold - Supine Bridge  - 1 x daily - 7 x weekly - 2 sets - 10 reps - Supine Hip Adduction Isometric with Ball  - 1 x daily - 7 x weekly - 2 sets - 10 reps - 3 sec hold - Hooklying Clamshell with Resistance  - 1 x daily - 7 x weekly - 2 sets - 10 reps - 3 sec hold  Patient Education - Check for Safety - Posture and Body Mechanics  ASSESSMENT:  CLINICAL IMPRESSION: Ilisa reports her hip pain has improved by at least 50% and her LBP by 40% but still experiences intermittent most commonly during transitional movements. He back is more aggravated today due to washing and waxing her floor yesterday - pt noting good awareness of posture when maneuvering the mop but did have to get down on hands and knees for part of the job which seem to be more irritating for her back. Worked on exercises for lumbopelvic mobility and stability in quadruped with pt noting discomfort during transition from cat to cow positions but no pain as either end ROM. Weakness and fatigue noted with hip extension from quadruped but pt able to maintain good neutral spine. Pt requesting DN to address ongoing L-sided LBP - performed to L upper glutes and lumbar paraspinals in conjunction with manual STM/TPR with good twitch responses elicited resulting in palpable reduction in muscle tension/TTP and decreased pain upon transitional mobility following MT. Abbi continues to demonstrate progress toward goals and will continue to benefit from skilled PT to address ongoing deficits, improve  flexibility and core/LE strength to improve positional and activity/gait tolerance w/o pain interference, however she will be traveling for extended periods starting next week and not returning until after the end of her current certification period along with another trip planned later in Sept. As such will extend her certification period to allow her to return to PT between her travels and upon her return if needed.   OBJECTIVE IMPAIRMENTS: Abnormal gait, decreased activity tolerance, decreased balance, decreased endurance, decreased knowledge of condition, decreased knowledge of use of DME, decreased mobility, difficulty walking, decreased ROM, decreased strength, decreased safety awareness, increased fascial  restrictions, impaired perceived functional ability, increased muscle spasms, impaired flexibility, impaired sensation, improper body mechanics, postural dysfunction, and pain.   ACTIVITY LIMITATIONS: carrying, lifting, bending, standing, squatting, sleeping, stairs, transfers, bed mobility, and locomotion level  PARTICIPATION LIMITATIONS: meal prep, cleaning, laundry, driving, shopping, community activity, and yard work  PERSONAL FACTORS: Age, Fitness, Past/current experiences, Time since onset of injury/illness/exacerbation, and 3+ comorbidities: B THA - L THA 2014, R THA 2016, R THA revision 2019; chronic R hip pain, synovitis of hip, lumbar spondylosis/OA, DM-II, HTN, GERD, IBS, chronic diarrhea, bladder and rectal prolapse, hyperthyroidism, aortic insufficieny, aortic and mitral valve disorder, mitral regurgitation, neuropathy, RLS, h/o transient global amnesia  are also affecting patient's functional outcome.   REHAB POTENTIAL: Good  CLINICAL DECISION MAKING: Evolving/moderate complexity  EVALUATION COMPLEXITY: Moderate   GOALS: Goals reviewed with patient? Yes  SHORT TERM GOALS: Target date: 07/24/2022  Patient will be independent with initial HEP.  Baseline:  Goal status: MET   07/19/22  2.  Patient will report centralization of radicular symptoms.  Baseline:  Goal status: MET  07/19/22  3.  Patient will verbalize understanding of fall prevention measures in home to reduce risk for falls Baseline:  Goal status: MET  07/19/22  4.  Patient will demonstrate decreased TUG time to </= 13.5 sec to decrease risk for falls with transitional mobility Baseline: 15.75 sec Goal status: MET  07/19/22 - TUG = 12.03  LONG TERM GOALS: Target date: 08/21/2022, extended to 09/27/22    Patient will be independent with advanced/ongoing HEP to improve outcomes and carryover.  Baseline:  Goal status: IN PROGRESS  2.  Patient will report 50-75% improvement in low back pain to improve QOL.  Baseline:  Goal status: IN PROGRESS  08/02/22 - Pt reports 40% improvement.  3.  Patient will report at least 50-75% improvement in R hip pain to improve QOL. Baseline:  Goal status: IN PROGRESS  08/02/22 - Pt reports 50% improvement.  4.  Patient to demonstrate ability to achieve and maintain good spinal alignment/posturing and body mechanics needed for daily activities. Baseline:  Goal status: IN PROGRESS  08/02/22 - Pt reports improving postural awareness but still finds herself having to correct her posture  5.  Patient will demonstrate full pain free lumbar ROM to perform ADLs.   Baseline:  Goal status: IN PROGRESS  07/26/22 - Lumbar ROM WFL/WNL but still some pain with certain motions  6.  Patient will demonstrate improved B LE strength to >/= 4+/5 for improved stability and ease of mobility . Baseline: Refer to above MMT chart Goal status: IN PROGRESS  07/26/22 - Met for knee and ankles, hips partially met  7.  Patient will report 48 on hip FOTO to demonstrate improved functional ability.  Baseline: 44 Goal status: MET  07/26/22 - Hip FOTO = 50   8.  Patient will tolerate 20-30 min of standing and/or walking to perform chores, cooking or grocery shopping. Baseline:  Goal status: MET   07/26/29 - Pt reports she is able to stand or walk for 30+ minutes  9.  Patient will demonstrate at least 19/30 on FGA to decrease risk of falls. Baseline: 15/30 (eval); 18/30 (07/26/22) Goal status: IN PROGRESS  07/26/22 - FGA = 18/30  10.  Patient will improve 5x STS time to </= 15 seconds to demonstrate improved functional strength and transfer efficiency  Baseline: 19.06 sec (eval); 17.0 sec (07/19/22); 14.25 (07/26/22) Goal status: MET  07/26/22 - 5xSTS = 14.25 sec   PLAN: PT  FREQUENCY: 2x/week  PT DURATION: 8 weeks  PLANNED INTERVENTIONS: Therapeutic exercises, Therapeutic activity, Neuromuscular re-education, Balance training, Gait training, Patient/Family education, Joint mobilization, Stair training, DME instructions, Dry Needling, Electrical stimulation, Spinal mobilization, Cryotherapy, Moist heat, scar mobilization, Taping, Traction, Ultrasound, Ionotophoresis 23m/ml Dexamethasone, Manual therapy, and Re-evaluation.  PLAN FOR NEXT SESSION: progress lumbopelvic/LE flexibility and strengthening as tolerated - review/update HEP as indicated; MT +/- DN to address abnormal muscle tension/tightness and pain in B lumbar paraspinals and glutes; modalities PRN for pain   JPercival Spanish PT 08/02/2022, 1:59 PM   PHYSICAL THERAPY DISCHARGE SUMMARY  Visits from Start of Care: 11  Current functional level related to goals / functional outcomes:   Refer to above clinical impression and goal assessment for status as of last visit on 08/02/2022. Patient had to travel out of town due to family issues and was unable to return as planned in Sept. She has has not been able to return in greater than 30 days, therefore will proceed with discharge from PT for this episode.    Remaining deficits:   As above.  Unable to formally assess status as of discharge due to failure to return.   Education / Equipment:   HEP, pBiomedical scientisteducation   Patient agrees to discharge. Patient goals were  partially met. Patient is being discharged due to not returning since the last visit.  JPercival Spanish PT, MPT 10/23/22, 5:26 PM  [facility] [place] [address]Phone: [phone]  Fax:  [phone]

## 2022-09-03 ENCOUNTER — Ambulatory Visit: Payer: PPO | Admitting: Physical Therapy

## 2022-09-06 ENCOUNTER — Ambulatory Visit: Payer: PPO | Admitting: Physical Therapy

## 2022-09-10 ENCOUNTER — Encounter: Payer: PPO | Admitting: Physical Therapy

## 2022-11-07 ENCOUNTER — Emergency Department (HOSPITAL_BASED_OUTPATIENT_CLINIC_OR_DEPARTMENT_OTHER)
Admission: EM | Admit: 2022-11-07 | Discharge: 2022-11-07 | Discharge status: Against Medical Advice | Payer: PPO | Attending: Emergency Medicine | Admitting: Emergency Medicine

## 2022-11-07 ENCOUNTER — Emergency Department (HOSPITAL_BASED_OUTPATIENT_CLINIC_OR_DEPARTMENT_OTHER): Payer: PPO

## 2022-11-07 ENCOUNTER — Encounter (HOSPITAL_BASED_OUTPATIENT_CLINIC_OR_DEPARTMENT_OTHER): Payer: Self-pay

## 2022-11-07 ENCOUNTER — Other Ambulatory Visit: Payer: Self-pay

## 2022-11-07 DIAGNOSIS — R29898 Other symptoms and signs involving the musculoskeletal system: Secondary | ICD-10-CM

## 2022-11-07 DIAGNOSIS — R202 Paresthesia of skin: Secondary | ICD-10-CM | POA: Insufficient documentation

## 2022-11-07 DIAGNOSIS — Z79899 Other long term (current) drug therapy: Secondary | ICD-10-CM | POA: Diagnosis not present

## 2022-11-07 DIAGNOSIS — Z7984 Long term (current) use of oral hypoglycemic drugs: Secondary | ICD-10-CM | POA: Diagnosis not present

## 2022-11-07 LAB — CBC WITH DIFFERENTIAL/PLATELET
Abs Immature Granulocytes: 0.02 10*3/uL (ref 0.00–0.07)
Basophils Absolute: 0 10*3/uL (ref 0.0–0.1)
Basophils Relative: 0 %
Eosinophils Absolute: 0.2 10*3/uL (ref 0.0–0.5)
Eosinophils Relative: 3 %
HCT: 38.3 % (ref 36.0–46.0)
Hemoglobin: 12.9 g/dL (ref 12.0–15.0)
Immature Granulocytes: 0 %
Lymphocytes Relative: 27 %
Lymphs Abs: 1.9 10*3/uL (ref 0.7–4.0)
MCH: 30.8 pg (ref 26.0–34.0)
MCHC: 33.7 g/dL (ref 30.0–36.0)
MCV: 91.4 fL (ref 80.0–100.0)
Monocytes Absolute: 0.7 10*3/uL (ref 0.1–1.0)
Monocytes Relative: 9 %
Neutro Abs: 4.3 10*3/uL (ref 1.7–7.7)
Neutrophils Relative %: 61 %
Platelets: 216 10*3/uL (ref 150–400)
RBC: 4.19 MIL/uL (ref 3.87–5.11)
RDW: 12.9 % (ref 11.5–15.5)
WBC: 7.1 10*3/uL (ref 4.0–10.5)
nRBC: 0 % (ref 0.0–0.2)

## 2022-11-07 LAB — LIPASE, BLOOD: Lipase: 41 U/L (ref 11–51)

## 2022-11-07 LAB — COMPREHENSIVE METABOLIC PANEL
ALT: 13 U/L (ref 0–44)
AST: 15 U/L (ref 15–41)
Albumin: 3.7 g/dL (ref 3.5–5.0)
Alkaline Phosphatase: 79 U/L (ref 38–126)
Anion gap: 8 (ref 5–15)
BUN: 16 mg/dL (ref 8–23)
CO2: 23 mmol/L (ref 22–32)
Calcium: 8.9 mg/dL (ref 8.9–10.3)
Chloride: 109 mmol/L (ref 98–111)
Creatinine, Ser: 1.05 mg/dL — ABNORMAL HIGH (ref 0.44–1.00)
GFR, Estimated: 54 mL/min — ABNORMAL LOW (ref >60)
Glucose, Bld: 109 mg/dL — ABNORMAL HIGH (ref 70–99)
Potassium: 3.3 mmol/L — ABNORMAL LOW (ref 3.5–5.1)
Sodium: 140 mmol/L (ref 135–145)
Total Bilirubin: 0.8 mg/dL (ref 0.3–1.2)
Total Protein: 6.7 g/dL (ref 6.5–8.1)

## 2022-11-07 LAB — URINALYSIS, ROUTINE W REFLEX MICROSCOPIC
Bilirubin Urine: NEGATIVE
Glucose, UA: NEGATIVE mg/dL
Ketones, ur: NEGATIVE mg/dL
Nitrite: POSITIVE — AB
Protein, ur: NEGATIVE mg/dL
Specific Gravity, Urine: 1.01 (ref 1.005–1.030)
pH: 6 (ref 5.0–8.0)

## 2022-11-07 LAB — TROPONIN I (HIGH SENSITIVITY)
Troponin I (High Sensitivity): 4 ng/L (ref <18)
Troponin I (High Sensitivity): 5 ng/L (ref <18)

## 2022-11-07 LAB — URINALYSIS, MICROSCOPIC (REFLEX): WBC, UA: >50 WBC/hpf (ref 0–5)

## 2022-11-07 MED ORDER — IOHEXOL 350 MG/ML SOLN
100.0000 mL | Freq: Once | INTRAVENOUS | Status: AC | PRN
  Administered 2022-11-07: 100 mL via INTRAVENOUS

## 2022-11-07 MED ORDER — LORAZEPAM 1 MG PO TABS: 0.5000 mg | ORAL_TABLET | Freq: Once | ORAL | Status: DC | PRN

## 2022-11-07 MED ORDER — CEPHALEXIN 250 MG PO CAPS
1000.0000 mg | ORAL_CAPSULE | Freq: Once | ORAL | Status: AC
  Administered 2022-11-07: 1000 mg via ORAL
  Filled 2022-11-07: qty 4

## 2022-11-07 MED ORDER — CEPHALEXIN 500 MG PO CAPS: ORAL_CAPSULE | ORAL | 0 refills | Status: DC

## 2022-11-07 NOTE — ED Notes (Signed)
Report given to Chestine Spore, RN at [facility].  Pt to be transported POV.

## 2022-11-07 NOTE — ED Notes (Signed)
Pt leaving POV with family at this time.  PT to proceed directly to [facility] ER for MRI.

## 2022-11-07 NOTE — ED Provider Notes (Signed)
[facility] EMERGENCY DEPARTMENT Provider Note   CSN: [id number] Arrival date & time: 11/07/22  0457     History  Chief Complaint  Patient presents with   Numbness    Shelby Robinson is a 76 y.o. female.  The history is provided by the patient and medical records.  Shelby Robinson is a 76 y.o. female who presents to the Emergency Department complaining of numbness and multiple complaints.  She states that she developed numbness in the left shoulder that traveled down to the hand over the course of several hours and started around 4 PM yesterday.  Around noon she had no symptoms.  She describes the numbness as a sharp and shooting pain that is worse with movement.  She has applied Salonpas patches.  She also reports a headache that just started at time of ED presentation.  She has a history of silent migraines and had 1 yesterday.  No vision changes.  No fevers, vomiting.  She does have occasional nausea.  She did have 1 episode of chest pain yesterday, none today.  She has a bladder infection and was started on Valium suppositories 2 weeks ago but is not currently on antibiotics.  Hx/o HTN, DM      Home Medications Prior to Admission medications   Medication Sig Start Date End Date Taking? Authorizing Provider  5-Hydroxytryptophan (5-HTP) 100 MG CAPS Take by mouth. Patient not taking: Reported on 06/26/2022    [provider]  atorvastatin (LIPITOR) 10 MG tablet Take 10 mg by mouth daily.    [provider]  Calcium Carbonate Antacid (CHOOZ PO) Take by mouth as needed. Patient not taking: Reported on 06/26/2022    [provider]  cetirizine (ZYRTEC) 10 MG tablet Take 10 mg by mouth as needed for allergies.    [provider]  Coenzyme Q10 200 MG TABS Take 30 mg by mouth 3 (three) times daily. Patient not taking: Reported on 06/26/2022    [provider]  fluticasone (FLONASE) 50 MCG/ACT nasal spray Place 1 spray into both nostrils  daily as needed for allergies or rhinitis.    [provider]  folic acid (FOLVITE) A999333 MCG tablet Take 400 mcg by mouth daily.    [provider]  Magnesium 400 MG CAPS Take 400 mg by mouth daily.    [provider]  Melatonin 10 MG TABS Take 10 mg by mouth as needed. Patient not taking: Reported on 06/26/2022    [provider]  meloxicam (MOBIC) 7.5 MG tablet Take 1 tablet by mouth daily. 08/09/20   [provider]  metFORMIN (GLUCOPHAGE) 500 MG tablet Take 1,000 mg by mouth in the morning and at bedtime. 03/29/21   [provider]  metoprolol (TOPROL-XL) 200 MG 24 hr tablet Take 200 mg by mouth daily.    [provider]  montelukast (SINGULAIR) 10 MG tablet montelukast 10 mg tablet 04/04/21   [provider]  NON FORMULARY Biotin rinse as needed for dry mouth    [provider]  Omega-3 Fatty Acids (FISH OIL OMEGA-3 PO) Take 360 mg by mouth.    [provider]  polyethylene glycol (MIRALAX / GLYCOLAX) 17 g packet Take 17 g by mouth daily. Patient not taking: Reported on 06/26/2022    [provider]  PRESCRIPTION MEDICATION Mega benfotiamine 250mg  once daily helps blood sugar Patient not taking: Reported on 06/26/2022    [provider]  spironolactone (ALDACTONE) 25 MG tablet TAKE 1/2 TABLET BY  MOUTH DAILY 12/13/21   Rollene Rotunda, MD  vitamin C (ASCORBIC ACID) 500 MG tablet Take 500 mg by mouth daily.    [provider]  VITAMIN D PO Take by mouth.    [provider]      Allergies    Angiotensin i, human [angiotensin]; Flagyl [metronidazole]; Codeine; Nalbuphine; Ace inhibitors; Angiotensin ii; Angiotensin receptor blockers; Chicken-peas-carrots [alitraq]; Other; Tree extract; Cottonseed oil; and Nickel    Review of Systems   Review of Systems  All other systems reviewed and are negative.   Physical Exam Updated Vital Signs BP (!) 163/93 (BP Location: Right  Arm)   Pulse 60   Temp 97.7 F (36.5 C) (Oral)   Resp 18   Ht 5\' 4"  (1.626 m)   Wt 68.9 kg   SpO2 100%   BMI 26.09 kg/m  Physical Exam Vitals and nursing note reviewed.  Constitutional:      Appearance: She is well-developed.  HENT:     Head: Normocephalic and atraumatic.  Cardiovascular:     Rate and Rhythm: Normal rate and regular rhythm.     Heart sounds: No murmur heard. Pulmonary:     Effort: Pulmonary effort is normal. No respiratory distress.     Breath sounds: Normal breath sounds.  Abdominal:     Palpations: Abdomen is soft.     Tenderness: There is no abdominal tenderness. There is no guarding or rebound.  Musculoskeletal:     Comments: There is tenderness to palpation over the left shoulder, wrist with range of motion intact.  Skin:    General: Skin is warm and dry.  Neurological:     Mental Status: She is alert and oriented to person, place, and time.     Comments: 2+ radial and DP pulses bilaterally.  There is 4+ out of 5 strength in the left upper extremity, 5 out of 5 strength in right upper extremity and bilateral lower extremities.  Sensation to light touch intact intact in all 4 extremities.  No asymmetry of facial movements.  She does have a mild left pronator drift.  She does appear to have pain, which limits her strength testing in the left upper extremity.  Visual fields are grossly intact.  Psychiatric:        Behavior: Behavior normal.     ED Results / Procedures / Treatments   Labs (all labs ordered are listed, but only abnormal results are displayed) Labs Reviewed - No data to display  EKG None  Radiology No results found.  Procedures Procedures    Medications Ordered in ED Medications - No data to display  ED Course/ Medical Decision Making/ A&P                           Medical Decision Making Amount and/or Complexity of Data Reviewed Labs: ordered. Radiology: ordered.  Risk Prescription drug management.   Patient here for  evaluation of numbness to the left upper extremity.  She does have a pain component to a 2.  She is well-perfused on examination with symmetric pulses.  Suspect that her weakness is more likely secondary to a radicular process we will check CT head to rule out additional process.  Patient did have some left-sided abdominal pain as well as chest pain yesterday, will check a CTA to rule out dissection.  Patient care transferred pending imaging.       Final Clinical Impression(s) / ED Diagnoses Final diagnoses:  None  Rx / DC Orders ED Discharge Orders     None         Quintella Reichert, MD 11/07/22 [phone]

## 2022-11-07 NOTE — ED Provider Notes (Signed)
I seen the patient in signout from Dr. Madilyn Hook, briefly the patient is a 76 year old female with a chief complaints of left arm weakness abdominal chest and back pain urinary symptoms.  The arm symptoms been going on for greater than 24 hours.  Thought to be more likely radiculopathy but plan for likely MRI with new left upper extremity weakness of the brain and C-spine.  CT angiogram of the chest abdomen pelvis is resulted and is negative for dissection or other obvious acute intrathoracic or abdominal pathology.  Her UA is concerning for urinary tract infection we will start her on Keflex.  I discussed the case with Dr. Fredderick Phenix who accepted the patient in ED to ED transfer for MRI.   Melene Plan, DO 11/07/22 [phone]

## 2022-11-07 NOTE — ED Triage Notes (Signed)
Pt reports that she began to have numbness yesterday at noon that began on LT shoulder and gradually worked its way down to hand. Pt now has difficulty using both legs. Unable to fully lift LT leg. Pt has hx of hip and BLE issues and lower back pain. Pt reports vision changes yesterday in RT eye into LT eye. NO slurred speech.

## 2022-11-09 LAB — URINE CULTURE: Culture: >=100000 — AB

## 2022-11-10 ENCOUNTER — Telehealth (HOSPITAL_BASED_OUTPATIENT_CLINIC_OR_DEPARTMENT_OTHER): Payer: Self-pay | Admitting: *Deleted

## 2022-11-10 NOTE — Telephone Encounter (Signed)
Post ED Visit - Positive Culture Follow-up: Successful Patient Follow-Up  Culture assessed and recommendations reviewed by:  []  , Pharm.D. []  Enzo Bi, Pharm.D., BCPS AQ-ID []  , Pharm.D., BCPS []  Celedonio Miyamoto, Pharm.D., BCPS []  [place], Garvin Fila.D., BCPS, AAHIVP []  , Pharm.D., BCPS, AAHIVP [x]  Georgina Pillion, PharmD, BCPS []  , PharmD, BCPS []  [place], PharmD, BCPS []  [place], PharmD  Positive urine culture  []  Patient discharged without antimicrobial prescription and treatment is now indicated [x]  Organism is resistant to prescribed ED discharge antimicrobial []  Patient with positive blood cultures  Changes discussed with ED provider: , PA New antibiotic prescription Cefdinir 300mg  PO BID x 5 days Called to Shelby Robinson , . [place], Shelby Robinson  Contacted patient, date 11/10/22, time 1148   [name] 11/10/2022, 11:47 AM

## 2022-11-10 NOTE — Progress Notes (Signed)
ED Antimicrobial Stewardship Positive Culture Follow Up   Shelby Robinson is an 76 y.o. female who presented to [facility] on 11/07/2022 with a chief complaint of  Chief Complaint  Patient presents with   Numbness    Recent Results (from the past 720 hour(s))  Urine Culture     Status: Abnormal   Collection Time: 11/07/22  6:00 AM   Specimen: Urine, Clean Catch  Result Value Ref Range Status   Specimen Description   Final    URINE, CLEAN CATCH Performed at [facility], [address] [place], [place] [address]    Special Requests   Final    NONE Performed at [facility], [address] [place], [place] [address]    Culture >=100,000 COLONIES/mL ESCHERICHIA COLI (A)  Final   Report Status 11/09/2022 FINAL  Final   Organism ID, Bacteria ESCHERICHIA COLI (A)  Final      Susceptibility   Escherichia coli - MIC*    AMPICILLIN >=32 RESISTANT Resistant     CEFAZOLIN >=64 RESISTANT Resistant     CEFEPIME <=0.12 SENSITIVE Sensitive     CEFTRIAXONE <=0.25 SENSITIVE Sensitive     CIPROFLOXACIN >=4 RESISTANT Resistant     GENTAMICIN <=1 SENSITIVE Sensitive     IMIPENEM <=0.25 SENSITIVE Sensitive     NITROFURANTOIN <=16 SENSITIVE Sensitive     TRIMETH/SULFA >=320 RESISTANT Resistant     AMPICILLIN/SULBACTAM >=32 RESISTANT Resistant     PIP/TAZO 64 INTERMEDIATE Intermediate     * >=100,000 COLONIES/mL ESCHERICHIA COLI    [x]  Treated with cephalexin, organism resistant to prescribed antimicrobial []  Patient discharged originally without antimicrobial agent and treatment is now indicated  New antibiotic prescription: DC cephalexin and start cefdinir 300mg  PO BID x 5 days  ED Provider: , PA-C   Rojean Ige, 11/10/2022, 10:33 AM Clinical Pharmacist Monday - Friday phone -  [phone] Saturday - Sunday phone - [phone]

## 2022-11-15 ENCOUNTER — Ambulatory Visit
Admission: RE | Admit: 2022-11-15 | Discharge: 2022-11-15 | Disposition: A | Discharge status: Home / Self Care | Payer: PPO | Source: Ambulatory Visit | Attending: Sports Medicine | Admitting: Sports Medicine

## 2022-11-15 ENCOUNTER — Ambulatory Visit: Payer: PPO | Admitting: Sports Medicine

## 2022-11-15 VITALS — BP 136/77 | Ht 64.0 in | Wt 150.0 lb

## 2022-11-15 DIAGNOSIS — M545 Low back pain, unspecified: Secondary | ICD-10-CM | POA: Insufficient documentation

## 2022-11-15 MED ORDER — METHYLPREDNISOLONE ACETATE 80 MG/ML IJ SUSP
80.0000 mg | Freq: Once | INTRAMUSCULAR | Status: AC
  Administered 2022-11-15: 80 mg via INTRAMUSCULAR

## 2022-11-15 NOTE — Assessment & Plan Note (Addendum)
Concern for compression fracture, obtain lumbar imaging.  Hip pathology unlikely given physical exam.  Opioids would not be beneficial given bowel difficulties.  Depo-Medrol injection to assist with pain and inflammation.  Discussed hyperglycemia affecting coordination with diabetes.  She would likely benefit from PT in the future.  Follow-up in 2 weeks to reassess.

## 2022-11-15 NOTE — Progress Notes (Signed)
  SUBJECTIVE:   CHIEF COMPLAINT / HPI:   76 year old female presents with acute low back pain after having a fall ~11/21 out of bed in which she caught herself with her arms.  She was seen in the ED and received widespread imaging which did not reveal any acute bony abnormalities or fractures.  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  She also has history of bowel/bladder incontinence intermittently and states this has not worsened by her fall.  She primarily has lumbar pain bilaterally which moves towards the right groin and towards her left leg and shoots down her thigh.  Denies loss of sensation bilaterally.  She has been using Tylenol which has not been helpful, and TENS units which has been helpful.  She does experience pain that wakes her up at night and prefers extension as flexion is more uncomfortable for her.  Denies fever as well.  PERTINENT  PMH / PSH: reviewed  OBJECTIVE:  BP 136/77   Ht 5\' 4"  (1.626 m)   Wt 150 lb (68 kg)   BMI 25.75 kg/m  Back: No gross deformity, scoliosis. TTP of spinous process L1/2 and L5.  No midline or bony TTP with reflex hammer. FROM, discomfort with flexion. Strength LEs 5/5 all muscle groups except 4/5 with hip flexion bilaterally..   Negative SLRs.  Negative logroll, foot deer, FABER Sensation intact to light touch bilaterally.   ASSESSMENT/PLAN:  Acute bilateral low back pain without sciatica Assessment & Plan: Concern for compression fracture, obtain lumbar imaging.  Hip pathology unlikely given physical exam.  Opioids would not be beneficial given bowel difficulties.  Depo-Medrol injection to assist with pain and inflammation.  Discussed hyperglycemia affecting coordination with diabetes.  She would likely benefit from PT in the future.  Follow-up in 2 weeks to reassess.  Orders: -     DG Lumbar Spine 2-3 Views; Future -     methylPREDNISolone Acetate  , DO 11/15/2022, 10:54 AM PGY-2, [facility]   Patient seen and evaluated with the resident.  I agree with the above plan of care.  We will order x-rays of the lumbar spine including AP and lateral views to rule out any sort of compression fracture that may not have been seen in initial imaging.  80 mg of Depo-Medrol IM today.  Patient has a good supportive lumbar corset at home.  She will continue to use heat and stim.  Follow-up with me again in 2 weeks.  An MRI done in 2019 showed diffuse degenerative changes throughout the lower lumbar spine so if her symptoms are not improving and her x-ray shows no evidence of compression fracture that I would consider an updated MRI in anticipation of possible referral for epidural or facet injections.  This note was dictated using Dragon naturally speaking software and may contain errors in syntax, spelling, or content which have not been identified prior to signing this note.

## 2022-11-17 IMAGING — MR MR HEAD WO/W CM
12 series · 48 of 48 positions shown · IV contrast (multihance)
Comparison: 02/05/2018

CLINICAL DATA: Memory loss and confusion with right-sided weakness

EXAM:
MRI HEAD WITHOUT AND WITH CONTRAST
TECHNIQUE: Multiplanar, multiecho pulse sequences of the brain and surrounding
structures were obtained without and with intravenous contrast.
CONTRAST:  13mL MULTIHANCE GADOBENATE DIMEGLUMINE 529 MG/ML IV SOLN

[Series 2: T1 · sagittal · 5.0mm · 0.45mm/px · 1 of 25 slices shown]
[im 1/25]
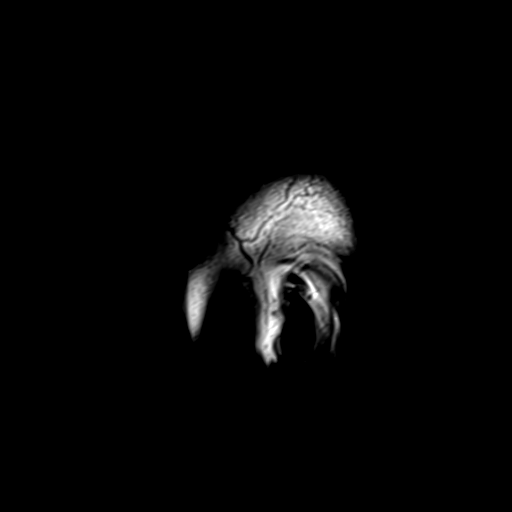

[Series 3: ax ep2d_diff_3 · axial · 3.0mm · 1.80mm/px · z∈[-79,+77]mm · 5 of 106 slices shown]
[im 1/106]
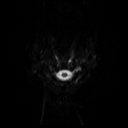
[im 27/106]
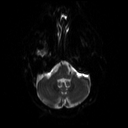
[im 53/106]
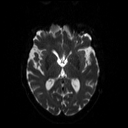
[im 79/106]
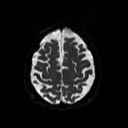
[im 106/106]
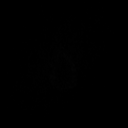

[Series 4: ax ep2d_diff_3_adc · axial · 3.0mm · 1.80mm/px · z∈[-79,+77]mm · 3 of 53 slices shown]
[im 1/53]
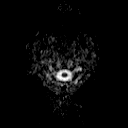
[im 27/53]
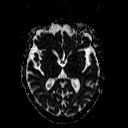
[im 53/53]
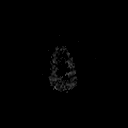

[Series 5: cor ep2d_diff · coronal · 5.0mm · 1.77mm/px · 3 of 57 slices shown]
[im 1/57]
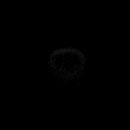
[im 29/57]
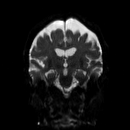
[im 57/57]
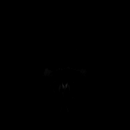

[Series 6: cor ep2d_diff_adc · coronal · 5.0mm · 1.77mm/px · 2 of 29 slices shown]
[im 1/29]
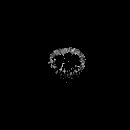
[im 29/29]
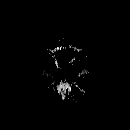

[Series 8: swi_images · axial · 2.0mm · 0.90mm/px · z∈[-76,+82]mm · 5 of 80 slices shown]
[im 1/80]
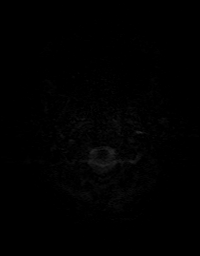
[im 20/80]
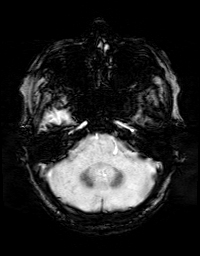
[im 40/80]
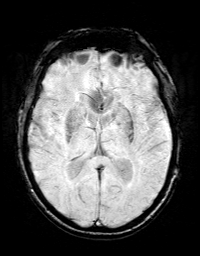
[im 60/80]
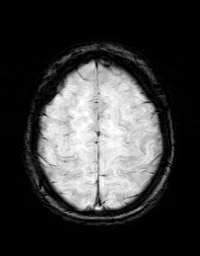
[im 80/80]
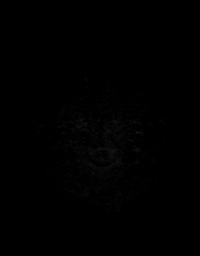

[Series 9: FLAIR · axial · 3.0mm · 0.45mm/px · z∈[-80,+80]mm · 3 of 42 slices shown]
[im 1/42]
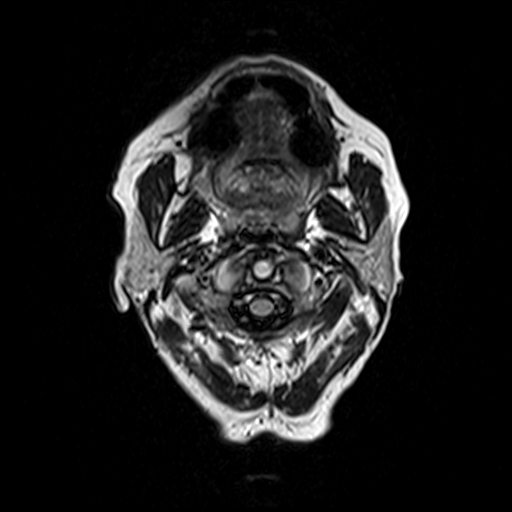
[im 21/42]
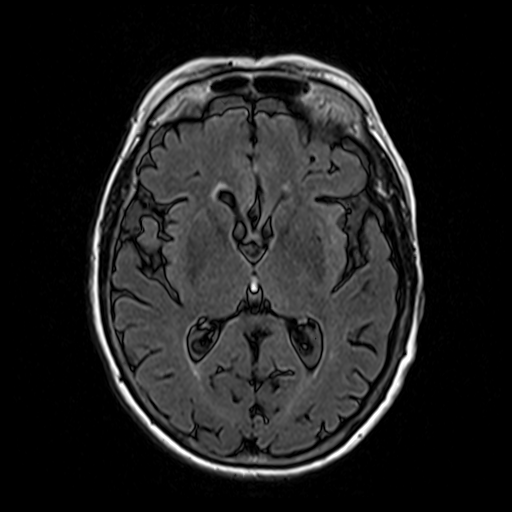
[im 42/42]
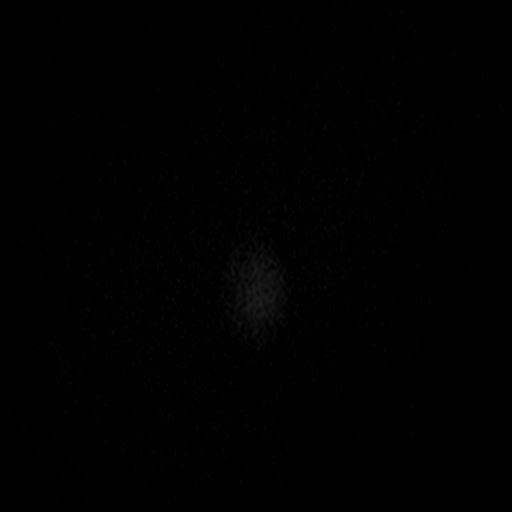

[Series 10: T2 · axial · 5.0mm · 0.72mm/px · z∈[-81,+87]mm · 2 of 29 slices shown (1 of 2)]
[im 1/29]
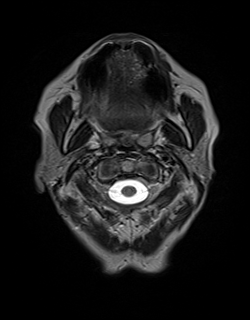
[im 29/29]
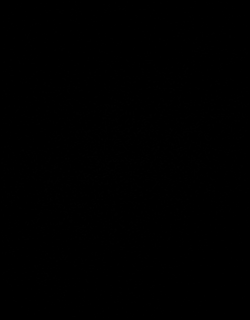

[Series 11: t1_mpr_tra · axial · 1.0mm · 0.90mm/px · z∈[-79,+80]mm · 10 of 160 slices shown]
[im 1/160]
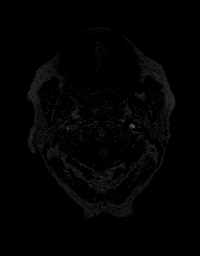
[im 18/160]
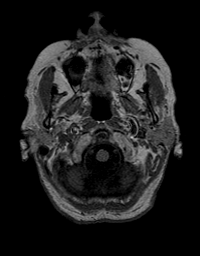
[im 36/160]
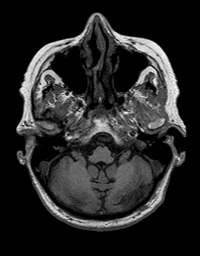
[im 54/160]
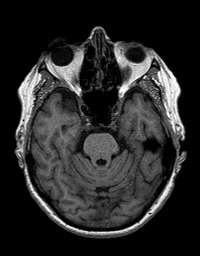
[im 71/160]
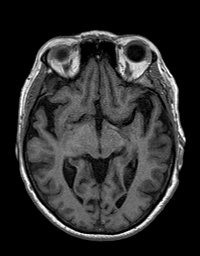
[im 89/160]
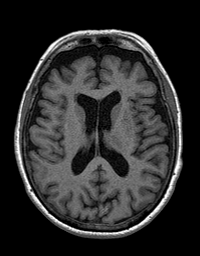
[im 107/160]
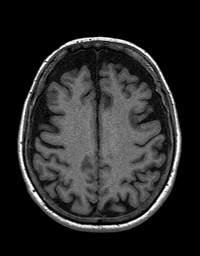
[im 124/160]
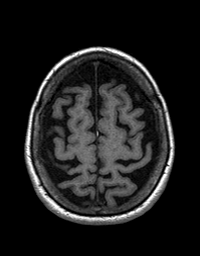
[im 142/160]
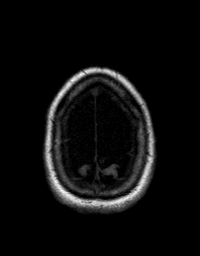
[im 160/160]
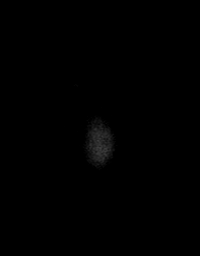

[Series 12: T2 · coronal · 5.0mm · 0.45mm/px · 2 of 29 slices shown (2 of 2)]
[im 1/29]
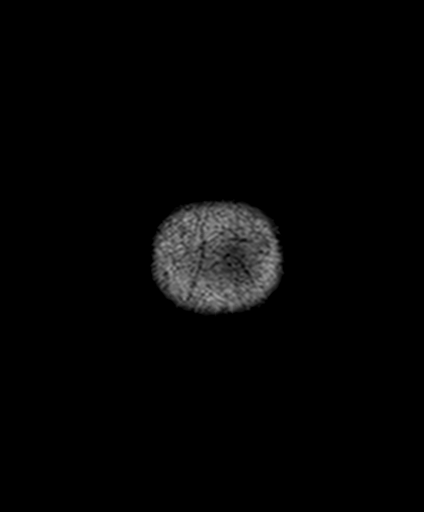
[im 29/29]
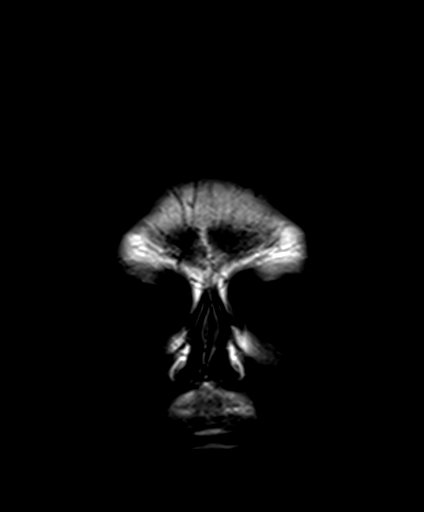

[Series 13: T1 post-contrast · coronal · 5.0mm · 0.72mm/px · 2 of 29 slices shown]
[im 1/29]
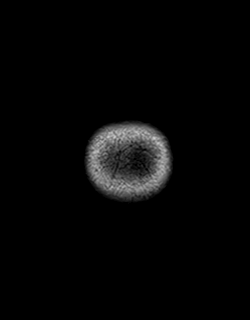
[im 29/29]
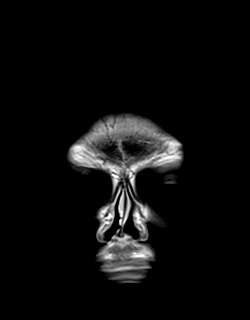

[Series 14: post t1_mpr_tra · axial · 1.0mm · 0.90mm/px · z∈[-79,+80]mm · 10 of 160 slices shown]
[im 1/160]
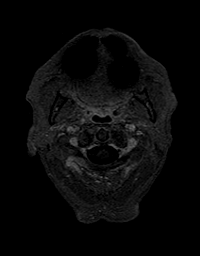
[im 18/160]
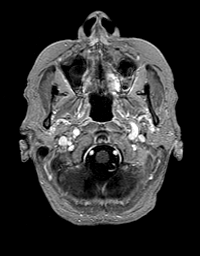
[im 36/160]
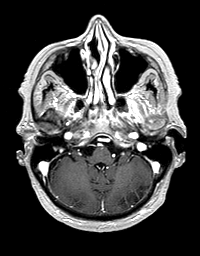
[im 54/160]
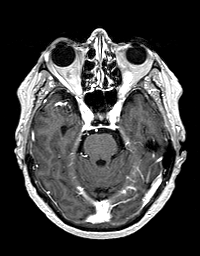
[im 71/160]
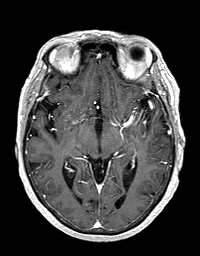
[im 89/160]
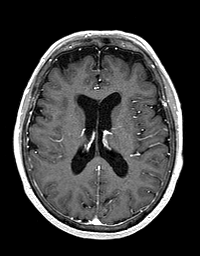
[im 107/160]
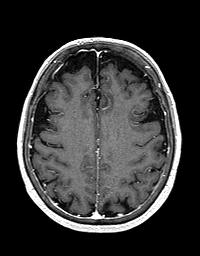
[im 124/160]
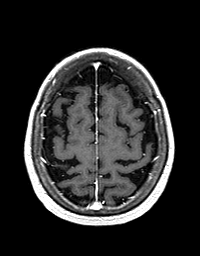
[im 142/160]
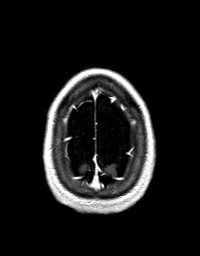
[im 160/160]
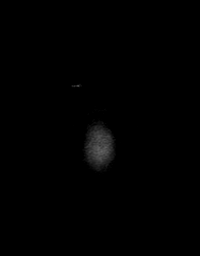

[48 of 48 positions shown; findings below may reference images not displayed]

FINDINGS: Brain: No acute or subacute infarction, hemorrhage, hydrocephalus,
extra-axial collection or mass lesion. No abnormal enhancement.
Generalized cerebral volume loss, mild for age. Age normal white
matter appearance

Vascular: Normal flow voids and vascular enhancements

Skull and upper cervical spine: Normal marrow signal

Sinuses/Orbits: Retention cysts in the inferior bilateral maxillary
sinus.

Other: Superficial left parotid nodule is stable and favors lymph
node.
IMPRESSION: Stable and unremarkable brain MRI.  No specific cause for symptoms.

## 2022-11-29 ENCOUNTER — Ambulatory Visit: Payer: PPO | Admitting: Sports Medicine

## 2022-11-29 VITALS — BP 114/86 | Ht 64.0 in | Wt 150.0 lb

## 2022-11-29 DIAGNOSIS — M545 Low back pain, unspecified: Secondary | ICD-10-CM

## 2022-11-29 MED ORDER — PREDNISONE 10 MG PO TABS: ORAL_TABLET | ORAL | 0 refills | Status: DC

## 2022-11-29 MED ORDER — TRAMADOL HCL 50 MG PO TABS: 50.0000 mg | ORAL_TABLET | Freq: Two times a day (BID) | ORAL | 0 refills | Status: DC | PRN

## 2022-11-29 NOTE — Progress Notes (Signed)
Patient ID: Shelby Robinson, female   DOB: [birth year], 76 y.o.   MRN: [id number]  Patient presents today for follow-up on low back pain.  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  No evidence of compression fracture.  Shelby Robinson continues to have sharp left-sided low back pain which is worse with walking, climbing in and out of the bed, and lifting her leg.  Pain is less with sitting.  No significant numbness or tingling down the left leg other than some intermittent numbness when getting in and out of bed.  The IM cortisone injection at last visit was somewhat helpful.  It did alleviate the pain on the right side of her lumbar spine but not the left.  Physical exam was not repeated today.  We simply talked about workup and treatment going forward.  I would like to get an updated MRI of her lumbar spine in anticipation of referral for probable diagnostic/therapeutic facet injections.  Her previous MRI in 2019 showed facet arthropathy at multiple levels as well as some spinal stenosis.  Phone follow-up with those results when available.  In the meantime, we will try a 6-day Sterapred Dosepak and I will also prescribe some tramadol which she may take as needed.  She has taken both of these medications in the past and tolerated them well.  This note was dictated using Dragon naturally speaking software and may contain errors in syntax, spelling, or content which have not been identified prior to signing this note.

## 2022-12-03 ENCOUNTER — Ambulatory Visit
Admission: RE | Admit: 2022-12-03 | Discharge: 2022-12-03 | Disposition: A | Discharge status: Home / Self Care | Payer: PPO | Source: Ambulatory Visit | Attending: Sports Medicine | Admitting: Sports Medicine

## 2022-12-03 DIAGNOSIS — M545 Low back pain, unspecified: Secondary | ICD-10-CM

## 2022-12-05 NOTE — Telephone Encounter (Signed)
  I spoke with Shelby Robinson on the phone today after reviewing MRI findings of her bar spine.  She has multilevel spondylosis which has advanced some when compared to the MRI from 2019.  I believe her main pain generator is the severe bilateral facet arthropathy seen at L5-S1.  Fortunately, she seems to have improved on a 6-day Sterapred Dosepak and tramadol.  [place].  She will see how things progress over the next few days and if symptoms do not continue to improve or once again worsen then we will consider referral for diagnostic/therapeutic L5-S1 facet injections.  This note was dictated using Dragon naturally speaking software and may contain errors in syntax, spelling, or content which have not been identified prior to signing this note.

## 2022-12-16 ENCOUNTER — Other Ambulatory Visit: Payer: PPO

## 2022-12-27 ENCOUNTER — Other Ambulatory Visit: Payer: Self-pay | Admitting: Sports Medicine

## 2022-12-27 ENCOUNTER — Ambulatory Visit (INDEPENDENT_AMBULATORY_CARE_PROVIDER_SITE_OTHER): Payer: PPO | Admitting: Sports Medicine

## 2022-12-27 VITALS — BP 161/90 | Ht 64.0 in | Wt 150.0 lb

## 2022-12-27 DIAGNOSIS — M545 Low back pain, unspecified: Secondary | ICD-10-CM

## 2022-12-27 DIAGNOSIS — M47816 Spondylosis without myelopathy or radiculopathy, lumbar region: Secondary | ICD-10-CM | POA: Diagnosis not present

## 2022-12-27 NOTE — Progress Notes (Signed)
   Subjective:    Patient ID: Shelby Robinson, female    DOB: March 28, 1946, 77 y.o.   MRN: [id number]  HPI  Shelby Robinson presents today to discuss MRI findings of her lumbar spine.  The MRI shows degenerative disc disease at L1-L2 with moderate left neuroforaminal stenosis at this level.  She also has degenerative disc disease at L4-L5.  At L5-S1 she has severe bilateral facet degenerative changes.  She finished a Sterapred Dosepak recently and had good symptom relief on the right but still has left-sided low back pain.  Pain is worse with standing and will occasionally radiate down the leg.  Pain is not as severe with sitting.  She is here today with her husband.    Review of Systems As above    Objective:   Physical Exam  Well-developed, well-nourished.  No acute distress.  Sitting comfortably in the exam room.  Lumbar spine: Fairly good lumbar range of motion with some pain with extension.  She is also tender to palpation along the left lower spine near the left L5-S1 facet.  No spasm.  No focal neurological deficit of either lower extremity.      Assessment & Plan:   Left-sided low back pain with MRI evidence of degenerative disc disease and facet arthropathy  I believe the patient's symptoms are originating from the facet arthropathy seen at L5-S1.  I recommend a referral to [facility] imaging for diagnostic/therapeutic left-sided L5-S1 facet injection.  If pain persist thereafter then we may need to consider a diagnostic lumbar ESI.  I have asked [name] to follow-up with me 2 weeks after her facet injection for check on her progress.  This note was dictated using Dragon naturally speaking software and may contain errors in syntax, spelling, or content which have not been identified prior to signing this note.

## 2023-01-02 ENCOUNTER — Ambulatory Visit
Admission: RE | Admit: 2023-01-02 | Discharge: 2023-01-02 | Disposition: A | Discharge status: Home / Self Care | Payer: PPO | Source: Ambulatory Visit | Attending: Sports Medicine | Admitting: Sports Medicine

## 2023-01-02 DIAGNOSIS — M545 Low back pain, unspecified: Secondary | ICD-10-CM

## 2023-01-02 MED ORDER — METHYLPREDNISOLONE ACETATE 40 MG/ML INJ SUSP (RADIOLOG
80.0000 mg | Freq: Once | INTRAMUSCULAR | Status: AC
  Administered 2023-01-02: 80 mg via INTRA_ARTICULAR

## 2023-01-02 MED ORDER — IOPAMIDOL (ISOVUE-M 200) INJECTION 41%
1.0000 mL | Freq: Once | INTRAMUSCULAR | Status: AC
  Administered 2023-01-02: 1 mL via INTRA_ARTICULAR

## 2023-01-02 NOTE — Discharge Instructions (Signed)

## 2023-01-08 ENCOUNTER — Other Ambulatory Visit: Payer: Self-pay

## 2023-01-08 DIAGNOSIS — M545 Low back pain, unspecified: Secondary | ICD-10-CM

## 2023-01-08 DIAGNOSIS — M47816 Spondylosis without myelopathy or radiculopathy, lumbar region: Secondary | ICD-10-CM

## 2023-01-15 ENCOUNTER — Ambulatory Visit (INDEPENDENT_AMBULATORY_CARE_PROVIDER_SITE_OTHER): Payer: PPO

## 2023-01-15 ENCOUNTER — Ambulatory Visit: Payer: PPO | Admitting: Orthopedic Surgery

## 2023-01-15 ENCOUNTER — Encounter: Payer: Self-pay | Admitting: Orthopedic Surgery

## 2023-01-15 VITALS — BP 170/90 | HR 57 | Ht 64.0 in | Wt 150.0 lb

## 2023-01-15 DIAGNOSIS — M545 Low back pain, unspecified: Secondary | ICD-10-CM

## 2023-01-15 NOTE — Progress Notes (Signed)
Orthopedic Spine Surgery Office Note  Assessment: Patient is a 77 y.o. female with chronic low back pain with acute exacerbation.  Has some symptoms of neurogenic claudication, but did get great relief of her main back pain with a L5/S1 facet injection   Plan: -Explained that initially conservative treatment is tried as a significant number of patients may experience relief with these treatment modalities. Discussed that the conservative treatments include:  -activity modification  -physical therapy  -over the counter pain medications  -medrol dosepak  -steroid injections -Patient has tried Physical therapy, NSAIDs, Tylenol, oral steroids, steroid injections  -Recommended she try another injection of the L5/S1.  If she continues to get good relief with that, she could keep doing those as long as they are not too frequent.  She also may be a candidate for RFA.  I provided her with a referral to Dr. Romona Curls office -I explained that her leg pain may be due to the stenosis, but her back pain seems more to be due to the L5-S1 facet arthropathy since she did get great relief with an injection to that area -Patient should return to office on an as-needed basis   Patient expressed understanding of the plan and all questions were answered to the patient's satisfaction.   ___________________________________________________________________________   History:  Patient is a 77 y.o. female who presents today for lumbar spine.  Patient has a multiyear history of low back pain, but has had acute worsening since November 2023.  She states there was no trauma or injury that preceded this acute worsening of the pain.  She feels it in her low back.  She states is when she is walking or standing for a while she does get pain into her groin bilaterally and into the lateral hips.  The leg pain is not nearly as severe as the back pain.  She states that she can live with the leg pain because it is tolerable.  She  states that she had a facet injection at L5/S1 with [facility] imaging that did provide her significant relief for 2 weeks.  She has not tried any other treatments to give her lasting or as good of relief.   Weakness: Yes, low back feels weak at times Symptoms of imbalance: Denies Paresthesias and numbness: Denies Bowel or bladder incontinence: Yes, chronic urinary incontinence and has had urethral sling for this problem.  No bowel incontinence Saddle anesthesia: Denies  Treatments tried: Physical therapy, NSAIDs, Tylenol, oral steroids, steroid injections  Review of systems: Denies fevers and chills, night sweats, unexplained weight loss, history of cancer. Has had pain that wakes her at night  Past medical history: Hyperlipidemia Hypertension GERD Diabetes B12 deficiency Hyperthyroidism  Allergies: Angiotensin I, Flagyl, codeine, nalbuphine, ACE inhibitors, ARBs, nickel  Past surgical history:  Bilateral THA Revision THA Urethral sling Right tennis elbow surgery Hysterectomy  Social history: Denies use of nicotine product (smoking, vaping, patches, smokeless) Alcohol use: yes, 1 drink per week Denies recreational drug use   Physical Exam:  General: no acute distress, appears stated age Neurologic: alert, answering questions appropriately, following commands Respiratory: unlabored breathing on room air, symmetric chest rise Psychiatric: appropriate affect, normal cadence to speech   MSK (spine):  -Strength exam      Left  Right EHL    5/5  5/5 TA    5/5  5/5 GSC    5/5  5/5 Knee extension  5/5  5/5 Hip flexion   5/5  5/5  -Sensory exam  Sensation intact to light touch in L3-S1 nerve distributions of bilateral lower extremities  -Achilles DTR: 2/4 on the left, 2/4 on the right -Patellar tendon DTR: 2/4 on the left, 2/4 on the right  -Straight leg raise: Negative bilaterally -Clonus: no beats bilaterally  -Left hip exam: No pain to range of motion,  negative Stinchfield, negative Faber -Right hip exam: No pain to range of motion, negative Stinchfield, negative FABER  Imaging: XR of the lumbar spine from 01/15/2023 and 11/15/2022 was independently reviewed and interpreted, 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  Disc height loss at L1/2, L2/3, L3/4.  No evidence of instability on flexion/extension views.  No fracture or dislocation.  MRI of the lumbar spine from 12/03/2022 was independently reviewed and interpreted, showing DDD at L1/2, L2/3, L3/4, L4/5.  Modic changes at L1/2 and L2/3.  Facet arthropathy at L3/4, L4/5, L5/S1.  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  Foraminal stenosis at L1/2 and L2/3.   Patient name: Shelby Robinson Patient MRN: [id number] Date of visit: 01/15/23

## 2023-01-22 ENCOUNTER — Ambulatory Visit (INDEPENDENT_AMBULATORY_CARE_PROVIDER_SITE_OTHER): Payer: PPO | Admitting: Physical Medicine and Rehabilitation

## 2023-01-22 ENCOUNTER — Encounter: Payer: Self-pay | Admitting: Physical Medicine and Rehabilitation

## 2023-01-22 VITALS — BP 139/78 | HR 64

## 2023-01-22 DIAGNOSIS — M47816 Spondylosis without myelopathy or radiculopathy, lumbar region: Secondary | ICD-10-CM

## 2023-01-22 DIAGNOSIS — M545 Low back pain, unspecified: Secondary | ICD-10-CM

## 2023-01-22 DIAGNOSIS — M79645 Pain in left finger(s): Secondary | ICD-10-CM

## 2023-01-22 DIAGNOSIS — M542 Cervicalgia: Secondary | ICD-10-CM | POA: Diagnosis not present

## 2023-01-22 DIAGNOSIS — G8929 Other chronic pain: Secondary | ICD-10-CM

## 2023-01-22 NOTE — Progress Notes (Signed)
   01/22/23 [place] in the past year? 0  Number of falls in past year 0  Was there an injury with Fall? 0  Fall Risk Category Calculator 0  Fall Risk  Patient at Risk for Falls Due to Impaired balance/gait;Impaired mobility  Fall risk Follow up Falls prevention discussed

## 2023-01-22 NOTE — Progress Notes (Signed)
Shelby Robinson - 77 y.o. female MRN [id number]  Date of birth: [birth year]  Office Visit Note: Visit Date: 01/22/2023 PCP: Robyne Peers, MD Referred by: Robyne Peers, MD  Subjective: Chief Complaint  Patient presents with   Lower Back - Pain   HPI: Shelby Robinson is a 77 y.o. female who comes in today per the request of Dr. Ileene Rubens for evaluation of chronic, worsening and severe bilateral lower back pain. Pain ongoing for several months and worsens with movement, activity and standing. Pain when moving from sitting to standing position. She describes pain as sore and aching, currently rates as 8 out of 10. Some relief of pain with home exercise regimen, heating pad, rest and medications. Dr. Lilia Argue did prescribe 60 tablets of 50 mg Tramadol in December, she takes this intermittently as needed. History of formal physical therapy at [facility] in 2023, minimal relief of pain with these treatments. Recent lumbar MRI imaging exhibits moderate to severe spinal canal stenosis at the level of L2-L3, moderate canal stenosis at L3-L4 and severe bilateral facet degenerative change at L5-S1. She recently underwent bilateral L5-S1 facet joint injections at [facility] with significant relief of pain, greater than 80% for several weeks. She was recently evaluated by our spine surgeon Dr. Ileene Rubens whom recommended radiofrequency ablation. She does ambulate with cane. Patient denies focal weakness, numbness and tingling. No recent trauma or falls.   Of note, patient also reports chronic issues with neck pain and left thumb pain. States she feels her pain is moving up her back to bilateral neck. Also states popping and cracking noises with movement of left thumb. To her knowledge no previous testing for rheumatological issues. States daughter does have rheumatoid arthritis.    Review of Systems  Musculoskeletal:  Positive for back pain.  Neurological:   Negative for tingling, sensory change, focal weakness and weakness.  All other systems reviewed and are negative.  Otherwise per HPI.  Assessment & Plan: Visit Diagnoses:    ICD-10-CM   1. Chronic bilateral low back pain without sciatica  M54.50 Ambulatory referral to Physical Medicine Rehab   G89.29     2. Spondylosis without myelopathy or radiculopathy, lumbar region  M47.816 Ambulatory referral to Physical Medicine Rehab    3. Facet arthropathy, lumbar  M47.816 Ambulatory referral to Physical Medicine Rehab    4. Bilateral neck pain  M54.2     5. Pain of left thumb  M79.645        Plan: Findings:  1. Chronic, worsening and severe bilateral lower back pain. No radicular symptoms at this time. Patient continues to have severe pain despite good conservative therapies such as formal physical therapy, home exercise regimen, rest and use of medications. Patients clinical presentation and exam are consistent with facet medicated pain. She does have pain with lumbar extension upon exam today. There is severe degenerative bilateral facet changes at L5-S1. Significant relief of recent facet joint injection at [facility]. Next step is to perform diagnostic bilateral L5-S1 facet joint injections under fluoroscopic guidance. If good relief of pain with second set of facet blocks we did discuss possible longer sustained pain relief with radiofrequency ablation procedure. If her pain proves to be more radicular in nature, we would consider performing lumbar epidural steroid injection. There is moderate to severe severe spinal canal stenosis the level of L2-L3 and moderate at L3-L4. Patient denies focal weakness, numbness and tingling. No recent trauma or falls.  2. Chronic bilateral neck pain. Brief examination through interview today. Seems more like osteoarthritic changes. Would encourage patient to follow up with Dr. Micheline Chapman if pain persists. We would be happy to see her back if he feels injection is  appropriate.  3. Chronic left thumb pain. Likely [facility] arthritis. Would also follow up with Dr. Micheline Chapman, could benefit from seeing orthopedic specialist, could also consider rheumatological work up.   Dr. Ernestina Patches participated with direct patient care including clinical review, exam when needed and significant portion of diagnostic and treatment plan.     Meds & Orders: No orders of the defined types were placed in this encounter.   Orders Placed This Encounter  Procedures   Ambulatory referral to Physical Medicine Rehab    Follow-up: Return for Bilateral L5-S1 facet joint injections.   Procedures: No procedures performed      Clinical History: EXAM: MRI LUMBAR SPINE WITHOUT CONTRAST   TECHNIQUE: Multiplanar, multisequence MR imaging of the lumbar spine was performed. No intravenous contrast was administered.   COMPARISON:  MRI L Spine 02/05/18   FINDINGS: Segmentation:  Standard.   Alignment: There is mild stepwise retrolisthesis of L1 on L2 and L2 on L3.   Vertebrae: There are severe reactive degenerative endplate marrow changes at L1-L2 on the left. Chronic pars defects at L5.   Conus medullaris and cauda equina: Conus extends to the L1-L2 disc space level. Conus and cauda equina appear normal. 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   Paraspinal and other soft tissues: T2 hyperintense lesion along the interpolar left kidney favored to represent a renal cyst. Atrophy of the psoas musculature bilaterally.   Disc levels:   T11-T12: Only imaged in the sagittal plane. There is a small disc bulge. There is no spinal canal stenosis. No neural foraminal stenosis.   T12-L1: No significant disc bulge. Mild bilateral facet degenerative change. No spinal canal stenosis. Neural foraminal stenosis.   L1-L2: Moderate disc loss. Circumferential disc bulge with a superimposed left paracentral disc protrusion. There is mild-to-moderate spinal canal narrowing with narrowing of the  left lateral recess. Mild right and moderate left neuroforaminal stenosis. Mild to moderate bilateral facet degenerative change. These findings have progressed compared to 2019.   L2-L3: Moderate disc space loss. Moderate bilateral facet degenerative change. Ligamentum flavum hypertrophy. Moderate to severe spinal canal narrowing. Mild right and mild-to-moderate left neural foraminal narrowing.These findings are unchnaged compared to 2019.   L3-L4: Moderate spinal canal stenosis. Moderate bilateral facet degenerative change. Eccentric right disc bulge. Mild-to-moderate right and mild left neural foraminal narrowing. These findings are unchanged compared to 2019.   L4-L5: Circumferential disc bulge with a superimposed right paracentral disc protrusion fissure. There is narrowing of the right lateral recess. Mild overall spinal canal narrowing. Mild-to-moderate right and mild left neural foraminal narrowing.There is a 5 mm intraspinal synovial cyst associated with the left facet joint, which is new from prior. Compared to prior exam there is interval increase in the degree of interspinous degenerative change.   L5-S1: Severe bilateral facet degenerative change. No significant disc bulge. Unchanged perineural cyst in the right neuroforamen. Persistent mass effect on the exiting right L5 nerve root from an eccentric right disc bulge. This is unchanged from prior exam.   IMPRESSION: 1. 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 There is associated mild-to-moderate spinal canal stenosis and moderate left neural foraminal stenosis at this level. 2. Compared to prior exam there is interval  increase in the degree of interspinous degenerative change at L4-L5. 3. Otherwise there is no significant interval change compared to 2019 with persistent moderate to severe spinal canal narrowing at L2-L3  and moderate spinal canal narrowing at L3-L4     Electronically Signed   By: Marin Roberts M.D.   On: 12/04/2022 08:27   She reports that she has never smoked. She has never used smokeless tobacco. No results for input(s): "HGBA1C", "LABURIC" in the last 8760 hours.  Objective:  VS:  HT:    WT:   BMI:     BP:139/78  HR:64bpm  TEMP: ( )  RESP:  Physical Exam Vitals and nursing note reviewed.  HENT:     Head: Normocephalic and atraumatic.     Right Ear: External ear normal.     Left Ear: External ear normal.     Nose: Nose normal.     Mouth/Throat:     Mouth: Mucous membranes are moist.  Eyes:     Pupils: Pupils are equal, round, and reactive to light.  Cardiovascular:     Rate and Rhythm: Normal rate.     Pulses: Normal pulses.  Pulmonary:     Effort: Pulmonary effort is normal.  Abdominal:     General: Abdomen is flat. There is no distension.  Musculoskeletal:        General: Tenderness present.     Cervical back: Normal range of motion.     Comments: Pt is slow to rise from seated position to standing. Concordant low back pain with facet loading, lumbar spine extension and rotation. Strong distal strength without clonus, no pain upon palpation of greater trochanters. Sensation intact bilaterally. Ambulates with cane, gait slow and unsteady.  Skin:    General: Skin is warm and dry.     Capillary Refill: Capillary refill takes less than 2 seconds.  Neurological:     Mental Status: She is alert and oriented to person, place, and time.     Gait: Gait abnormal.  Psychiatric:        Mood and Affect: Mood normal.        Behavior: Behavior normal.     Ortho Exam  Imaging: No results found.  Past Medical/Family/Surgical/Social History: Medications & Allergies reviewed per EMR, new medications updated. 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    Constipation    Diabetes mellitus without complication (HCC)    GERD (gastroesophageal reflux disease)    Hypertension    Hyperthyroidism    Lumbar spondylosis    Osteoarthritis of lumbar spine    Synovitis of hip    Tarlov cyst    Family History  Problem Relation Age of Onset   Alzheimer's disease Mother    Stroke Father    CAD Father 26       CABG   Coarctation of the aorta Daughter    Past Surgical History:  Procedure Laterality Date   cataract  ELBOW SURGERY Right    HIP SURGERY     Bilateral THR   TOTAL HIP ARTHROPLASTY     VAGINAL HYSTERECTOMY     Social History   Occupational History   Not on file  Tobacco Use   Smoking status: Never   Smokeless tobacco: Never  Vaping Use   Vaping Use: Never used  Substance and Sexual Activity   Alcohol use: Yes    Comment: rare   Drug use: No   Sexual activity: Not on file

## 2023-01-22 NOTE — Progress Notes (Signed)
LBP since thanksgiving. Pain across back into right groin. Sometimes unable to walk. No numbness but tingling down bilateral legs. Said inj in January helped. Shelby Robinson it is still helping some.

## 2023-02-06 ENCOUNTER — Ambulatory Visit: Payer: Self-pay

## 2023-02-06 ENCOUNTER — Ambulatory Visit (INDEPENDENT_AMBULATORY_CARE_PROVIDER_SITE_OTHER): Payer: PPO | Admitting: Physical Medicine and Rehabilitation

## 2023-02-06 VITALS — BP 125/80 | HR 88

## 2023-02-06 DIAGNOSIS — M47816 Spondylosis without myelopathy or radiculopathy, lumbar region: Secondary | ICD-10-CM

## 2023-02-06 MED ORDER — BUPIVACAINE HCL 0.5 % IJ SOLN
3.0000 mL | Freq: Once | INTRAMUSCULAR | Status: AC
  Administered 2023-02-06: 3 mL

## 2023-02-06 NOTE — Patient Instructions (Signed)

## 2023-02-06 NOTE — Progress Notes (Signed)
Functional Pain Scale - descriptive words and definitions  Distressing (6)    Pain is present/unable to complete most ADLs limited by pain/sleep is difficult and active distraction is only marginal. Moderate range order  Average Pain 6-9   +Driver, -BT, -Dye Allergies.  Lower back pain on both sides with radiation into the hip and lower left abdomen

## 2023-02-13 NOTE — Procedures (Signed)
Lumbar Diagnostic Facet Joint Nerve Block with Fluoroscopic Guidance   Patient: Shelby Robinson      Date of Birth: 1946-03-04 MRN: [id number] PCP: Robyne Peers, MD      Visit Date: 02/06/2023   Universal Protocol:    Date/Time: 02/28/245:21 AM  Consent Given By: the patient  Position: PRONE  Additional Comments: Vital signs were monitored before and after the procedure. Patient was prepped and draped in the usual sterile fashion. The correct patient, procedure, and site was verified.   Injection Procedure Details:   Procedure diagnoses:  1. Spondylosis without myelopathy or radiculopathy, lumbar region      Meds Administered:  Meds ordered this encounter  Medications   bupivacaine (MARCAINE) 0.5 % (with pres) injection 3 mL     Laterality: Bilateral  Location/Site: L5-S1, L4 medial branch and L5 dorsal ramus  Needle: 5.0 in., 25 ga.  Short bevel or Quincke spinal needle  Needle Placement: Oblique pedical  Findings:   -Comments: There was excellent flow of contrast along the articular pillars without intravascular flow.  Procedure Details: The fluoroscope beam is vertically oriented in AP and then obliqued 15 to 20 degrees to the ipsilateral side of the desired nerve to achieve the "Scotty dog" appearance.  The skin over the target area of the junction of the superior articulating process and the transverse process (sacral ala if blocking the L5 dorsal rami) was locally anesthetized with a 1 ml volume of 1% Lidocaine without Epinephrine.  The spinal needle was inserted and advanced in a trajectory view down to the target.   After contact with periosteum and negative aspirate for blood and CSF, correct placement without intravascular or epidural spread was confirmed by injecting 0.5 ml. of Isovue-250.  A spot radiograph was obtained of this image.    Next, a 0.5 ml. volume of the injectate described above was injected. The needle was then redirected to the other  facet joint nerves mentioned above if needed.  Prior to the procedure, the patient was given a Pain Diary which was completed for baseline measurements.  After the procedure, the patient rated their pain every 30 minutes and will continue rating at this frequency for a total of 5 hours.  The patient has been asked to complete the Diary and return to [place] by mail, fax or hand delivered as soon as possible.   Additional Comments:  No complications occurred Dressing: 2 x 2 sterile gauze and Band-Aid    Post-procedure details: Patient was observed during the procedure. Post-procedure instructions were reviewed.  Patient left the clinic in stable condition.

## 2023-02-13 NOTE — Progress Notes (Signed)
Shelby Robinson - 77 y.o. female MRN [id number]  Date of birth: 08/19/1946  Office Visit Note: Visit Date: 02/06/2023 PCP: Robyne Peers, MD Referred by: Robyne Peers, MD  Subjective: Chief Complaint  Patient presents with   Lower Back - Pain   HPI:  Shelby Robinson is a 77 y.o. female who comes in today at the request of Barnet Pall, FNP and Dr. Ileene Rubens for planned Bilateral  L5-S1 Lumbar facet/medial branch block with fluoroscopic guidance.  The patient has failed conservative care including home exercise, medications, time and activity modification.  This injection will be diagnostic and hopefully therapeutic.  Please see requesting physician notes for further details and justification.  Exam has shown concordant pain with facet joint loading.   ROS Otherwise per HPI.  Assessment & Plan: Visit Diagnoses:    ICD-10-CM   1. Spondylosis without myelopathy or radiculopathy, lumbar region  M47.816 XR C-ARM NO REPORT    Facet Injection    bupivacaine (MARCAINE) 0.5 % (with pres) injection 3 mL      Plan: No additional findings.   Meds & Orders:  Meds ordered this encounter  Medications   bupivacaine (MARCAINE) 0.5 % (with pres) injection 3 mL    Orders Placed This Encounter  Procedures   Facet Injection   XR C-ARM NO REPORT    Follow-up: Return for visit to requesting provider as needed.   Procedures: No procedures performed  Lumbar Diagnostic Facet Joint Nerve Block with Fluoroscopic Guidance   Patient: Shelby Robinson      Date of Birth: 11/29/1946 MRN: [id number] PCP: Robyne Peers, MD      Visit Date: 02/06/2023   Universal Protocol:    Date/Time: 02/28/245:21 AM  Consent Given By: the patient  Position: PRONE  Additional Comments: Vital signs were monitored before and after the procedure. Patient was prepped and draped in the usual sterile fashion. The correct patient, procedure, and site was verified.   Injection Procedure Details:    Procedure diagnoses:  1. Spondylosis without myelopathy or radiculopathy, lumbar region      Meds Administered:  Meds ordered this encounter  Medications   bupivacaine (MARCAINE) 0.5 % (with pres) injection 3 mL     Laterality: Bilateral  Location/Site: L5-S1, L4 medial branch and L5 dorsal ramus  Needle: 5.0 in., 25 ga.  Short bevel or Quincke spinal needle  Needle Placement: Oblique pedical  Findings:   -Comments: There was excellent flow of contrast along the articular pillars without intravascular flow.  Procedure Details: The fluoroscope beam is vertically oriented in AP and then obliqued 15 to 20 degrees to the ipsilateral side of the desired nerve to achieve the "Scotty dog" appearance.  The skin over the target area of the junction of the superior articulating process and the transverse process (sacral ala if blocking the L5 dorsal rami) was locally anesthetized with a 1 ml volume of 1% Lidocaine without Epinephrine.  The spinal needle was inserted and advanced in a trajectory view down to the target.   After contact with periosteum and negative aspirate for blood and CSF, correct placement without intravascular or epidural spread was confirmed by injecting 0.5 ml. of Isovue-250.  A spot radiograph was obtained of this image.    Next, a 0.5 ml. volume of the injectate described above was injected. The needle was then redirected to the other facet joint nerves mentioned above if needed.  Prior to the procedure, the patient was given a Pain Diary which was  completed for baseline measurements.  After the procedure, the patient rated their pain every 30 minutes and will continue rating at this frequency for a total of 5 hours.  The patient has been asked to complete the Diary and return to [place] by mail, fax or hand delivered as soon as possible.   Additional Comments:  No complications occurred Dressing: 2 x 2 sterile gauze and Band-Aid    Post-procedure details: Patient  was observed during the procedure. Post-procedure instructions were reviewed.  Patient left the clinic in stable condition.   Clinical History: EXAM: MRI LUMBAR SPINE WITHOUT CONTRAST   TECHNIQUE: Multiplanar, multisequence MR imaging of the lumbar spine was performed. No intravenous contrast was administered.   COMPARISON:  MRI L Spine 02/05/18   FINDINGS: Segmentation:  Standard.   Alignment: There is mild stepwise retrolisthesis of L1 on L2 and L2 on L3.   Vertebrae: There are severe reactive degenerative endplate marrow changes at L1-L2 on the left. Chronic pars defects at L5.   Conus medullaris and cauda equina: Conus extends to the L1-L2 disc space level. Conus and cauda equina appear normal. 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   Paraspinal and other soft tissues: T2 hyperintense lesion along the interpolar left kidney favored to represent a renal cyst. Atrophy of the psoas musculature bilaterally.   Disc levels:   T11-T12: Only imaged in the sagittal plane. There is a small disc bulge. There is no spinal canal stenosis. No neural foraminal stenosis.   T12-L1: No significant disc bulge. Mild bilateral facet degenerative change. No spinal canal stenosis. Neural foraminal stenosis.   L1-L2: Moderate disc loss. Circumferential disc bulge with a superimposed left paracentral disc protrusion. There is mild-to-moderate spinal canal narrowing with narrowing of the left lateral recess. Mild right and moderate left neuroforaminal stenosis. Mild to moderate bilateral facet degenerative change. These findings have progressed compared to 2019.   L2-L3: Moderate disc space loss. Moderate bilateral facet degenerative change. Ligamentum flavum hypertrophy. Moderate to severe spinal canal narrowing. Mild right and mild-to-moderate left neural foraminal narrowing.These findings are unchnaged compared to 2019.   L3-L4: Moderate spinal canal stenosis. Moderate bilateral  facet degenerative change. Eccentric right disc bulge. Mild-to-moderate right and mild left neural foraminal narrowing. These findings are unchanged compared to 2019.   L4-L5: Circumferential disc bulge with a superimposed right paracentral disc protrusion fissure. There is narrowing of the right lateral recess. Mild overall spinal canal narrowing. Mild-to-moderate right and mild left neural foraminal narrowing.There is a 5 mm intraspinal synovial cyst associated with the left facet joint, which is new from prior. Compared to prior exam there is interval increase in the degree of interspinous degenerative change.   L5-S1: Severe bilateral facet degenerative change. No significant disc bulge. Unchanged perineural cyst in the right neuroforamen. Persistent mass effect on the exiting right L5 nerve root from an eccentric right disc bulge. This is unchanged from prior exam.   IMPRESSION: 1. 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 There is associated mild-to-moderate spinal canal stenosis and moderate left neural foraminal stenosis at this level. 2. Compared to prior exam there is interval increase in the degree of interspinous degenerative change at L4-L5. 3. Otherwise there is no significant interval change compared to 2019 with persistent moderate to severe spinal canal narrowing at L2-L3 and moderate spinal canal narrowing at L3-L4     Electronically Signed   By: Marin Roberts M.D.   On:  12/04/2022 08:27     Objective:  VS:  HT:    WT:   BMI:     BP:125/80  HR:88bpm  TEMP: ( )  RESP:  Physical Exam Vitals and nursing note reviewed.  Constitutional:      General: She is not in acute distress.    Appearance: Normal appearance. She is not ill-appearing.  HENT:     Head: Normocephalic and atraumatic.     Right Ear: External ear normal.     Left Ear: External ear normal.   Eyes:     Extraocular Movements: Extraocular movements intact.  Cardiovascular:     Rate and Rhythm: Normal rate.     Pulses: Normal pulses.  Pulmonary:     Effort: Pulmonary effort is normal. No respiratory distress.  Abdominal:     General: There is no distension.     Palpations: Abdomen is soft.  Musculoskeletal:        General: Tenderness present.     Cervical back: Neck supple.     Right lower leg: No edema.     Left lower leg: No edema.     Comments: Patient has good distal strength with no pain over the greater trochanters.  No clonus or focal weakness.  Skin:    Findings: No erythema, lesion or rash.  Neurological:     General: No focal deficit present.     Mental Status: She is alert and oriented to person, place, and time.     Sensory: No sensory deficit.     Motor: No weakness or abnormal muscle tone.     Coordination: Coordination normal.  Psychiatric:        Mood and Affect: Mood normal.        Behavior: Behavior normal.      Imaging: No results found.

## 2023-03-26 ENCOUNTER — Telehealth: Payer: Self-pay | Admitting: Orthopedic Surgery

## 2023-03-26 ENCOUNTER — Ambulatory Visit (INDEPENDENT_AMBULATORY_CARE_PROVIDER_SITE_OTHER): Payer: PPO | Admitting: Sports Medicine

## 2023-03-26 VITALS — BP 124/82 | Ht 64.0 in | Wt 150.0 lb

## 2023-03-26 DIAGNOSIS — M47816 Spondylosis without myelopathy or radiculopathy, lumbar region: Secondary | ICD-10-CM

## 2023-03-26 NOTE — Telephone Encounter (Signed)
Patient called in with question for Dr Christell Constant Ask if it's ok if Dr. Margaretha Sheffield orders repeat L5-S1 facet injections at [facility]. Please advise

## 2023-03-26 NOTE — Patient Instructions (Addendum)
Dr. Brain [facility] [address] [place], [place] [address] [phone]  -Ask him if it's ok if Dr. Margaretha Sheffield orders repeat L5-S1 facet injections at [facility].

## 2023-03-26 NOTE — Progress Notes (Signed)
Patient ID: Shelby Robinson, female   DOB: 27-Oct-1946, 77 y.o.   MRN: [id number]  Shelby Robinson presents today with her husband to discuss ongoing low back pain.  An MRI of her lumbar spine done in December of last year showed some severe facet arthropathy at L4-L5 and L5-S1.  She was then referred for diagnostic/therapeutic facet injections at [facility] imaging.  She notes a positive response to these injections.  However, when her symptoms returned, she was evaluated by [facility]ell [facility]  He had mentioned repeat facet injections as an option or consultation with Dr. Alvester Morin to discuss possible ablation.  She then saw Dr. Alvester Morin on February 21 of this year.  She notes that the injections in his office were not nearly as helpful as the steroid injections administered at [facility] imaging.  Review of his office notes shows that he did bilateral nerve blocks of the L4 and L5 medial branches and dorsal rami.  Shelby Robinson was supposed to keep a pain journal for the following 5 hours but she claimed that she never received that journal.  She does note that her pain did improve immediately postprocedure but she was confused as to why the injections did not work as well as the ones at [facility] imaging until I explained to her that Dr. [place] Blas procedure was not meant to be for long-lasting relief.  Physical exam was not repeated today.  We simply talked about her ongoing chronic back pain.  Given her positive response to previous facet injections, I would like to order repeat injections at [facility] imaging.  However, I would like for her to clear this with Dr. Kathi Der office first.  I also explained to her that she may also eventually be a candidate for ablation but she may need to return to Dr. [place] Blas office for repeat nerve blocks since she did not record a pain journal as requested by him postprocedure.  Of note, her grandson is getting married at the beginning of June.  Assuming that we proceed with repeat facet  injections at [facility] imaging, we could also consider an IM steroid injection and a Sterapred Dosepak just prior to her leaving in June for the wedding.  She had a good response to this previously.  Total time spent with the patient face-to-face today in the office was 30 minutes.

## 2023-03-27 ENCOUNTER — Encounter: Payer: Self-pay | Admitting: Sports Medicine

## 2023-03-27 NOTE — Telephone Encounter (Signed)
I called to advise patient of Dr. Kathi Der message, and she was glad to hear it, however she doesn't remember coming to see Dr. Christell Constant and she wasn't sure how she got here to see him, I advised that Dr. Margaretha Sheffield referred her on 01/08/23 and that she saw Dr. Christell Constant on 01/15/23. She seemed confused about this and doesn't remember ever coming her to be seen.

## 2023-03-28 ENCOUNTER — Other Ambulatory Visit: Payer: Self-pay | Admitting: Sports Medicine

## 2023-03-28 ENCOUNTER — Other Ambulatory Visit: Payer: Self-pay

## 2023-03-28 DIAGNOSIS — M47816 Spondylosis without myelopathy or radiculopathy, lumbar region: Secondary | ICD-10-CM

## 2023-04-10 ENCOUNTER — Ambulatory Visit
Admission: RE | Admit: 2023-04-10 | Discharge: 2023-04-10 | Disposition: A | Discharge status: Home / Self Care | Payer: PPO | Source: Ambulatory Visit | Attending: Sports Medicine | Admitting: Sports Medicine

## 2023-04-10 DIAGNOSIS — M47816 Spondylosis without myelopathy or radiculopathy, lumbar region: Secondary | ICD-10-CM

## 2023-04-10 MED ORDER — IOPAMIDOL (ISOVUE-M 200) INJECTION 41%
1.0000 mL | Freq: Once | INTRAMUSCULAR | Status: AC
  Administered 2023-04-10: 1 mL via INTRA_ARTICULAR

## 2023-04-10 MED ORDER — METHYLPREDNISOLONE ACETATE 40 MG/ML INJ SUSP (RADIOLOG
80.0000 mg | Freq: Once | INTRAMUSCULAR | Status: AC
  Administered 2023-04-10: 80 mg via INTRA_ARTICULAR

## 2023-04-10 NOTE — Discharge Instructions (Signed)

## 2023-06-23 ENCOUNTER — Encounter: Payer: Self-pay | Admitting: Sports Medicine

## 2023-07-01 ENCOUNTER — Encounter: Payer: Self-pay | Admitting: Cardiovascular Disease

## 2023-07-04 ENCOUNTER — Ambulatory Visit: Payer: PPO | Admitting: Orthopedic Surgery

## 2023-07-04 DIAGNOSIS — M533 Sacrococcygeal disorders, not elsewhere classified: Secondary | ICD-10-CM | POA: Diagnosis not present

## 2023-07-04 DIAGNOSIS — G8929 Other chronic pain: Secondary | ICD-10-CM | POA: Diagnosis not present

## 2023-07-04 MED ORDER — MELOXICAM 15 MG PO TABS: 15.0000 mg | ORAL_TABLET | Freq: Every day | ORAL | 0 refills | Status: DC

## 2023-07-04 NOTE — Progress Notes (Signed)
Orthopedic Spine Surgery Office Note   Assessment: Patient is a 77 y.o. female with chronic low back pain with progressive worsening.  Based on her exam and symptoms, SI joint is the possible etiology     Plan: -Patient has tried PT, NSAIDs, Tylenol, oral steroids, facet steroid injections  -Recommended diagnostic/therapeutic injection to the left SI joint with Dr. Shon Baton.  Explained to her that if she gets good relief with that injection then that points to the SI joint as her origin of pain -Told her that she can get repeat injection at the [facility] joint if it works up to every 3 months.  If they stop working, then pain management or SI fusion would be options -Patient has noticed relief with Mobic in the past, so that was prescribed today -Patient should return to office in 1 month, x-rays at next visit: None     Patient expressed understanding of the plan and all questions were answered to the patient's satisfaction.    ___________________________________________________________________________     History:   Patient is a 77 y.o. female who presents today for follow up on her low back pain.  Patient has had low back pain since November 2023.  There is no trauma or injury that preceded the onset of pain.  She has tried now 3 L5/S1 facet injections.  She noted relief of her lower right back pain but has had persistent left lower back pain.  She points to the area of the SI joint.  She is not having any pain radiating into her lower extremities.  She does have right hip pain but has had hip surgery including a revision on that right side and has had pain since those surgeries.  She feels the pain in the left lower back with any weightbearing activity but notes it is worse with rotation activities such as making the bed.  The pain is gotten progressively worse since I saw her last and she notes that sometimes the pain brings her to her knees and forces her to stop doing even household light chore type  activities.  Denies paresthesias and numbness.     Treatments tried: PT, NSAIDs, Tylenol, oral steroids, steroid injections    Physical Exam:   General: no acute distress, appears stated age Neurologic: alert, answering questions appropriately, following commands Respiratory: unlabored breathing on room air, symmetric chest rise Psychiatric: appropriate affect, normal cadence to speech     MSK (spine):   -Strength exam                                                   Left                  Right EHL                              5/5                  5/5 TA                                 5/5                  5/5 GSC  5/5                  5/5 Knee extension            5/5                  5/5 Hip flexion                    5/5                  5/5   -Sensory exam                           Sensation intact to light touch in L3-S1 nerve distributions of bilateral lower extremities   -Straight leg raise: Negative bilaterally -Clonus: no beats bilaterally   -Left hip exam: No pain to range of motion, negative Stinchfield, positive FABER, positive SI joint compression test, positive Fortin finger test, TTP over the SI joint   Imaging: XR of the lumbar spine from 01/15/2023 and 11/15/2022 was previously independently reviewed and interpreted, 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  Disc height loss at L1/2, L2/3, L3/4.  No evidence of instability on flexion/extension views.  No fracture or dislocation.   MRI of the lumbar spine from 12/03/2022 was previously independently reviewed and interpreted, showing DDD at L1/2, L2/3, L3/4, L4/5.  Modic changes at L1/2 and L2/3.  Facet arthropathy at L3/4, L4/5, L5/S1.  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  Foraminal stenosis at L1/2 and L2/3.     Patient name: Shelby Robinson Patient MRN: [id number] Date of visit: 07/04/23

## 2023-07-12 ENCOUNTER — Other Ambulatory Visit (HOSPITAL_COMMUNITY): Payer: Self-pay | Admitting: Orthopedic Surgery

## 2023-07-12 DIAGNOSIS — Z96641 Presence of right artificial hip joint: Secondary | ICD-10-CM

## 2023-07-18 ENCOUNTER — Ambulatory Visit: Payer: PPO | Admitting: Sports Medicine

## 2023-07-18 ENCOUNTER — Other Ambulatory Visit: Payer: Self-pay

## 2023-07-18 ENCOUNTER — Encounter: Payer: Self-pay | Admitting: Sports Medicine

## 2023-07-18 DIAGNOSIS — G8929 Other chronic pain: Secondary | ICD-10-CM

## 2023-07-18 DIAGNOSIS — M47816 Spondylosis without myelopathy or radiculopathy, lumbar region: Secondary | ICD-10-CM

## 2023-07-18 DIAGNOSIS — M533 Sacrococcygeal disorders, not elsewhere classified: Secondary | ICD-10-CM | POA: Diagnosis not present

## 2023-07-18 NOTE — Progress Notes (Signed)
Office & Procedure Note  Patient: Shelby Robinson             Date of Birth: 1946/03/27           MRN: [id number]             Visit Date: 07/18/2023  HPI: Shelby Robinson is a pleasant 77 year old female with acute on chronic low back pain.  She does have significant lumbar degenerative disc disease but he recently saw Dr. Gwenevere Abbot for some of her pain was emanating from the SI joint region.  Here today to discuss SI joint injection.  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  Possible screw that is loose.  Has tried therapy in the past but this worsened her back pain.  PE:   -There is limited endrange flexion and extension of the lumbar spine.  Positive TTP over the left SI joint, positive Fortin's point test as well as FABER testing.   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  Disc height loss at L1/2, L2/3, L3/4.  No evidence of instability on flexion/extension views.  No fracture or dislocation.   MRI of the lumbar spine from 12/03/2022 was previously independently reviewed and interpreted, showing DDD at L1/2, L2/3, L3/4, L4/5.  Modic changes at L1/2 and L2/3.  Facet arthropathy at L3/4, L4/5, L5/S1.  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  Foraminal stenosis at L1/2 and L2/3.  Visit Diagnoses:  1. Chronic left SI joint pain   2. Spondylosis without myelopathy or radiculopathy, lumbar region    Procedures:  U/S-guided SI-joint injection, left   After discussion of risk/benefits/indications, informed verbal consent was obtained. A timeout was then performed. The patient was positioned in a prone position on exam room table with a pillow placed under the pelvis for mild hip flexion. The SI joint area was cleaned and prepped with betadine and  alcohol swabs. Sterile ultrasound gel was applied and the ultrasound transducer was placed in an anatomic axial plane over the PSIS, then moved distally over the SI-joint. Using ultrasound guidance, a 22-gauge, 3.5" needle was inserted from a medial to lateral approach utilizing an in-plane approach and directed into the SI-joint. The SI-joint was then injected with a mixture of 4:2 lidocaine:depomedrol with visualization of the injectate flow into the SI-joint under ultrasound visualization. The patient tolerated the procedure well without immediate complications.  Plan:  -Shelby Robinson has low back pain with multiple conditions contributing to her low back pain, through shared decision making did proceed with diagnostic and therapeutic SI joint injection under ultrasound guidance.  She had good relief of her pain even over the next 5-10 minutes from the anesthetic portion.  She will follow-up with Dr. Christell Constant in 1 month to evaluate and reassess this benefit. -Discussed therapy or home exercises that she can do, she tried therapy in the past that worsened her pain.  Will hold off on this further for the time being. -May use ice, Tylenol for any postinjection pain - f/u with Dr. Christell Constant; I am happy to see her as needed  Madelyn Brunner, DO Primary Care Sports Medicine Physician  [facility] - Orthopedics  This note was dictated using Dragon naturally speaking software and may contain errors in syntax, spelling, or content which have not been identified prior to signing  this note.

## 2023-07-24 ENCOUNTER — Encounter (HOSPITAL_COMMUNITY)
Admission: RE | Admit: 2023-07-24 | Discharge: 2023-07-24 | Disposition: A | Discharge status: Home / Self Care | Payer: PPO | Source: Ambulatory Visit | Attending: Orthopedic Surgery | Admitting: Orthopedic Surgery

## 2023-07-24 DIAGNOSIS — Z96641 Presence of right artificial hip joint: Secondary | ICD-10-CM | POA: Insufficient documentation

## 2023-07-24 MED ORDER — TECHNETIUM TC 99M MEDRONATE IV KIT
20.0000 | PACK | Freq: Once | INTRAVENOUS | Status: AC | PRN
  Administered 2023-07-24: 20 via INTRAVENOUS

## 2023-07-29 ENCOUNTER — Ambulatory Visit: Payer: PPO | Admitting: Cardiovascular Disease

## 2023-08-01 ENCOUNTER — Ambulatory Visit: Payer: PPO | Admitting: Orthopedic Surgery

## 2023-08-01 DIAGNOSIS — M48061 Spinal stenosis, lumbar region without neurogenic claudication: Secondary | ICD-10-CM

## 2023-08-01 MED ORDER — CYCLOBENZAPRINE HCL 10 MG PO TABS: 10.0000 mg | ORAL_TABLET | Freq: Three times a day (TID) | ORAL | 0 refills | Status: AC | PRN

## 2023-08-01 NOTE — Progress Notes (Signed)
Orthopedic Spine Surgery Office Note   Assessment: Patient is a 77 y.o. female with chronic low back pain that radiates into her left groin     Plan: -Patient has tried PT, NSAIDs, Tylenol, oral steroids, facet steroid injections  -She did not get lasting relief with prior injections. Her pain has now changed and is radiating more towards the hip. It could be coming from L1 nerve. Recommended diagnostic/therapeutic injection  -Prescribed flexeril for additional pain relief -Patient should return to office on an as needed basis     Patient expressed understanding of the plan and all questions were answered to the patient's satisfaction.    ___________________________________________________________________________     History:   Patient is a 77 y.o. female who presents today for follow up on her low back pain.  Patient has now had 9 months of low back pain.  She feels it in the lower back especially on the left side.  Since I last saw her, her pain is gotten worse and has changed.  She now has pain radiating into the groin.  She feels it along the lower abdominal wall/upper portion of the groin going towards the midline.  It does not go all the way to midline.  She states when her back pain flares up she feels this pain radiating as well.  She does not have the same pain on the right side.  No pain radiating into her buttock or the left lower extremity.  No pain radiating into the right lower extremity.  She has now gotten SI injections and facet injections neither of which provided her with significant relief.  She states that the pain will be severe and sometimes feel like a spasm.  It can get to the point that it causes her to go to the ground.  She now uses a cane in the event that 1 of these bad spasms occurs. Pain is still worse with any kind of twisting or bending activity. She notices sitting in a chair with a heated massage is helpful.    Treatments tried: PT, NSAIDs, Tylenol, oral  steroids, steroid injections     Physical Exam:   General: no acute distress, appears stated age Neurologic: alert, answering questions appropriately, following commands Respiratory: unlabored breathing on room air, symmetric chest rise Psychiatric: appropriate affect, normal cadence to speech     MSK (spine):   -Strength exam                                                   Left                  Right EHL                              5/5                  5/5 TA                                 5/5                  5/5 GSC  5/5                  5/5 Knee extension            5/5                  5/5 Hip flexion                    5/5                  5/5   -Sensory exam                           Sensation intact to light touch in L3-S1 nerve distributions of bilateral lower extremities   -Straight leg raise: Negative bilaterally -Clonus: no beats bilaterally   -Left hip exam: No pain to range of motion, negative Stinchfield, positive FABER, negative SI joint compression test, negative Fortin finger test, TTP over the lumbar paraspinal muscles on the left only   Imaging: XR of the lumbar spine from 01/15/2023 and 11/15/2022 was previously independently reviewed and interpreted, 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  Disc height loss at L1/2, L2/3, L3/4.  No evidence of instability on flexion/extension views.  No fracture or dislocation.   MRI of the lumbar spine from 12/03/2022 was previously independently reviewed and interpreted, showing DDD at L1/2, L2/3, L3/4, L4/5.  Modic changes at L1/2 and L2/3.  Facet arthropathy at L3/4, L4/5, L5/S1.  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  Foraminal stenosis at L1/2 and L2/3.     Patient name: Shelby Robinson Patient MRN: [id number] Date of visit: 08/01/23

## 2023-08-07 ENCOUNTER — Ambulatory Visit: Payer: PPO | Admitting: Cardiovascular Disease

## 2023-08-07 ENCOUNTER — Encounter: Payer: Self-pay | Admitting: Cardiovascular Disease

## 2023-08-07 VITALS — BP 110/80 | HR 57 | Ht 63.0 in | Wt 165.6 lb

## 2023-08-07 DIAGNOSIS — I351 Nonrheumatic aortic (valve) insufficiency: Secondary | ICD-10-CM

## 2023-08-07 DIAGNOSIS — I10 Essential (primary) hypertension: Secondary | ICD-10-CM | POA: Diagnosis not present

## 2023-08-07 DIAGNOSIS — Z79899 Other long term (current) drug therapy: Secondary | ICD-10-CM | POA: Diagnosis not present

## 2023-08-07 MED ORDER — SPIRONOLACTONE 25 MG PO TABS
25.0000 mg | ORAL_TABLET | Freq: Every day | ORAL | 3 refills | Status: DC
Start: 2023-08-07 — End: 2024-07-22

## 2023-08-07 NOTE — Patient Instructions (Signed)
Medication Instructions:  INCREASE Spironolactone to 25mg  daily *If you need a refill on your cardiac medications before your next appointment, please call your pharmacy*   Lab Work: BMET in 2-3 weeks If you have labs (blood work) drawn today and your tests are completely normal, you will receive your results only by: MyChart Message (if you have MyChart) OR A paper copy in the mail If you have any lab test that is abnormal or we need to change your treatment, we will call you to review the results.   Testing/Procedures: ECHO Your physician has requested that you have an echocardiogram. Echocardiography is a painless test that uses sound waves to create images of your heart. It provides your doctor with information about the size and shape of your heart and how well your heart's chambers and valves are working. This procedure takes approximately one hour. There are no restrictions for this procedure. Please do NOT wear cologne, perfume, aftershave, or lotions (deodorant is allowed). Please arrive 15 minutes prior to your appointment time.  Follow-Up: At [facility], you and your health needs are our priority.  As part of our continuing mission to provide you with exceptional heart care, we have created designated Provider Care Teams.  These Care Teams include your primary Cardiologist (physician) and Advanced Practice Providers (APPs -  Physician Assistants and Nurse Practitioners) who all work together to provide you with the care you need, when you need it.  Your next appointment:   1 year(s)  Provider:   Tonny Bollman, MD

## 2023-08-07 NOTE — Progress Notes (Signed)
Cardiology Office Note:    Date:  08/07/2023   ID:  Shelby Robinson, DOB Oct 29, 1946, MRN [id number]  PCP:  Carolin Coy   [facility] Providers Cardiologist:  Rollene Rotunda, MD     Referring MD: [name], MD   Chief Complaint  Patient presents with   Shortness of Breath    History of Present Illness:    Shelby Robinson is a 77 y.o. female presenting for evaluation of hypertension and aortic insufficiency.  The patient has been previously seen by Dr. Antoine Poche, last in December 2022.  She had been noted to have mild aortic valve insufficiency with plans for clinical follow-up.  Her most recent echocardiogram in 2017 demonstrated normal LVEF of 60 to 65%, no regional wall motion abnormalities, and grade 1 diastolic dysfunction.  There was mild aortic valve insufficiency and trivial mitral insufficiency noted.  RV function was normal.  Discussed the use of AI scribe software for clinical note transcription with the patient, who gave verbal consent to proceed.  The patient, with a history of heart issues and high blood pressure, presents with concerns about her blood pressure, which sometimes spikes causing dizziness and headache. She also expresses concern about potential issues with her aorta and leaky valves, based on previous medical evaluations. She reports experiencing hip problems and issues with mesh in her abdomen. She's limited by hip and leg discomfort and doesn't do much walking anymore. The patient is not very physically active and experiences shortness of breath with activity.  No orthopnea or PND. She also reports occasional chest discomfort, which she attributes to heartburn and is managing with Prilosec as needed. The patient also reports swelling in her feet, which has been managed in the past with medication. The patient also mentions having a persistent UTI since November and the appearance of skin spots, which are of unknown origin.       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   atorvastatin (LIPITOR) 10 MG tablet Take 10 mg by mouth daily.   cephALEXin (KEFLEX) 250 MG capsule Take 250 mg by mouth daily.   cyclobenzaprine (FLEXERIL) 10 MG tablet Take 1 tablet (10 mg total) by mouth 3 (three) times daily as needed for muscle spasms.   fluticasone (FLONASE) 50 MCG/ACT nasal spray Place 1 spray into both nostrils daily as needed for allergies or rhinitis.   glipiZIDE (GLUCOTROL XL) 5 MG 24 hr tablet Take 1 tablet by mouth daily.   metoprolol (TOPROL-XL) 200 MG 24 hr tablet Take 200 mg by mouth daily.   nitrofurantoin, macrocrystal-monohydrate, (MACROBID) 100 MG capsule Take 100 mg by mouth 2 (two) times daily.   spironolactone (ALDACTONE) 25 MG tablet Take 1 tablet (25  mg total) by mouth daily.   [DISCONTINUED] spironolactone (ALDACTONE) 25 MG tablet TAKE 1/2 TABLET BY MOUTH DAILY     Allergies:   Angiotensin i, human [angiotensin]; Flagyl [metronidazole]; Codeine; Nalbuphine; Ace inhibitors; Angiotensin ii; Angiotensin receptor blockers; Chicken-peas-carrots [alitraq]; Other; Tree extract; Cottonseed oil; and Nickel   Social History   Socioeconomic History   Marital status: Married    Spouse name: Not on file   Number of children: 3   Years of education: Not on file   Highest education level: Not on file   Occupational History   Not on file  Tobacco Use   Smoking status: Never   Smokeless tobacco: Never  Vaping Use   Vaping status: Never Used  Substance and Sexual Activity   Alcohol use: Yes    Comment: rare   Drug use: No   Sexual activity: Not on file  Other Topics Concern   Not on file  Social History Narrative   Lives at home with husband.  Two grandsons. Right handed    Social Determinants of Health   Financial Resource Strain: Not on file  Food Insecurity: Not on file  Transportation Needs: Not on file  Physical Activity: Not on file  Stress: Not on file  Social Connections: Not on file     Family History: The patient's family history includes Alzheimer's disease in her mother; CAD (age of onset: 13) in her father; Coarctation of the aorta in her daughter; Stroke in her father.  ROS:   Please see the history of present illness.    All other systems reviewed and are negative.  EKGs/Labs/Other Studies Reviewed:    The following studies were reviewed today: Echo 10/18/2016: Left ventricle: The cavity size was normal. Wall thickness was    normal. Systolic function was normal. The estimated ejection    fraction was in the range of 60% to 65%. Wall motion was normal;    there were no regional wall motion abnormalities. Doppler    parameters are consistent with abnormal left ventricular    relaxation (grade 1 diastolic dysfunction).  - Aortic valve: There was no stenosis. There was mild    regurgitation.  - Mitral valve: There was trivial regurgitation.  - Right ventricle: The cavity size was normal. Systolic function    was normal.  - Tricuspid valve: Peak RV-RA gradient (S): 21 mm Hg.  - Pulmonary arteries: PA peak pressure: 24 mm Hg (S).  - Inferior vena cava: The vessel was normal in size. The    respirophasic diameter changes were in the normal range (>= 50%),    consistent with normal central venous pressure.   EKG Interpretation Date/Time:  Wednesday  August 07 2023 09:11:18 EDT Ventricular Rate:  57 PR Interval:  172 QRS Duration:  70 QT Interval:  424 QTC Calculation: 412 R Axis:   -14  Text Interpretation: Sinus bradycardia Possible Inferior infarct , age undetermined Non-specific ST-t changes Confirmed by Tonny Bollman [phone]) on 08/07/2023 9:27:58 AM   Event Monitor 10/24/2021: NSR with no significant arrhythmia  Abdominal US 09/27/2021: Summary:  Abdominal Aorta: No evidence of an abdominal aortic aneurysm was  visualized. The largest aortic measurement is 2.2 cm.  Stenosis:  Mild scattered atherosclerosis without evidence of focal stenosis   Recent Labs: 11/07/2022: ALT 13; BUN 16; Creatinine, Ser 1.05; Hemoglobin 12.9; Platelets 216; Potassium 3.3; Sodium 140  Recent Lipid Panel No results found for: "CHOL", "TRIG", "HDL", "CHOLHDL", "VLDL", "LDLCALC", "LDLDIRECT"   Risk Assessment/Calculations:  Physical Exam:    VS:  BP 110/80   Pulse (!) 57   Ht 5\' 3"  (1.6 m)   Wt 165 lb 9.6 oz (75.1 kg)   SpO2 97%   BMI 29.33 kg/m     Wt Readings from Last 3 Encounters:  08/07/23 165 lb 9.6 oz (75.1 kg)  03/26/23 150 lb (68 kg)  01/15/23 150 lb (68 kg)     GEN:  Well nourished, well developed in no acute distress HEENT: Normal NECK: No JVD; No carotid bruits LYMPHATICS: No lymphadenopathy CARDIAC: RRR, no murmurs, rubs, gallops RESPIRATORY:  Clear to auscultation without rales, wheezing or rhonchi  ABDOMEN: Soft, non-tender, non-distended MUSCULOSKELETAL:  trace BL pretibial edema; No deformity  SKIN: Warm and dry NEUROLOGIC:  Alert and oriented x 3 PSYCHIATRIC:  Normal affect   ASSESSMENT:    1. Nonrheumatic aortic valve insufficiency   2. Essential hypertension   3. Medication management    PLAN:    In order of problems listed above:  Assessment and Plan    Hypertension Blood pressure readings are borderline with about 50% of readings in range and 50% higher than desired. Patient  is currently on Amlodipine 5mg  and Metoprolol. -Increase Spironolactone from 12.5mg  to 25mg  daily. -Check metabolic panel in 2 weeks to monitor potassium levels.  Cardiac Evaluation History of mild aortic insufficiency and shortness of breath with activity. Last echocardiogram was in 2017. -Order echocardiogram to evaluate heart function and valves.  Hyperlipidemia Patient is currently on Atorvastatin and tolerating it well. Last LDL was 86 in May. -Continue Atorvastatin.  Abdominal Pain Patient reports pain in the abdominal region and back, suspected to be muscle spasms. Concerns about aortic involvement due to past imaging findings. -Reassure patient that past imaging showed no significant aortic aneurysms. -Recent CTA chest/abd/pelvis reviewed  Skin Lesions Patient reports new skin lesions. -Recommend follow-up with Dermatology for evaluation.  Follow-up Plan for an annual visit and will call patient with echocardiogram results.                 Medication Adjustments/Labs and Tests Ordered: Current medicines are reviewed at length with the patient today.  Concerns regarding medicines are outlined above.  Orders Placed This Encounter  Procedures   Basic metabolic panel   EKG 12-Lead   EKG 12-Lead   ECHOCARDIOGRAM COMPLETE   Meds ordered this encounter  Medications   spironolactone (ALDACTONE) 25 MG tablet    Sig: Take 1 tablet (25 mg total) by mouth daily.    Dispense:  90 tablet    Refill:  3    Dose INCREASE    Patient Instructions  Medication Instructions:  INCREASE Spironolactone to 25mg  daily *If you need a refill on your cardiac medications before your next appointment, please call your pharmacy*   Lab Work: BMET in 2-3 weeks If you have labs (blood work) drawn today and your tests are completely normal, you will receive your results only by: MyChart Message (if you have MyChart) OR A paper copy in the mail If you have any lab test that is abnormal  or we need to change your treatment, we will call you to review the results.   Testing/Procedures: ECHO Your physician has requested that you have an echocardiogram. Echocardiography is a painless test that uses sound waves to create images of your heart. It provides your doctor with information about the size and shape of your heart and how well your heart's chambers and valves are working. This procedure takes approximately  one hour. There are no restrictions for this procedure. Please do NOT wear cologne, perfume, aftershave, or lotions (deodorant is allowed). Please arrive 15 minutes prior to your appointment time.  Follow-Up: At [facility], you and your health needs are our priority.  As part of our continuing mission to provide you with exceptional heart care, we have created designated Provider Care Teams.  These Care Teams include your primary Cardiologist (physician) and Advanced Practice Providers (APPs -  Physician Assistants and Nurse Practitioners) who all work together to provide you with the care you need, when you need it.  Your next appointment:   1 year(s)  Provider:   Tonny Bollman, MD     Signed, Tonny Bollman, MD  08/07/2023 4:53 PM    [facility]

## 2023-08-20 ENCOUNTER — Other Ambulatory Visit: Payer: Self-pay

## 2023-08-20 ENCOUNTER — Encounter: Payer: Self-pay | Admitting: Cardiovascular Disease

## 2023-08-20 ENCOUNTER — Ambulatory Visit: Payer: PPO | Admitting: Physical Medicine and Rehabilitation

## 2023-08-20 VITALS — BP 131/78 | HR 75

## 2023-08-20 DIAGNOSIS — M5416 Radiculopathy, lumbar region: Secondary | ICD-10-CM

## 2023-08-20 MED ORDER — METHYLPREDNISOLONE ACETATE 80 MG/ML IJ SUSP
80.0000 mg | Freq: Once | INTRAMUSCULAR | Status: AC
Start: 2023-08-20 — End: 2023-08-20
  Administered 2023-08-20: 80 mg

## 2023-08-20 NOTE — Progress Notes (Signed)
Shelby Robinson - 77 y.o. female MRN [id number]  Date of birth: Jun 14, 1946  Office Visit Note: Visit Date: 08/20/2023 PCP: Lavonda Jumbo, PA-C Referred by: London Sheer, MD  Subjective: Chief Complaint  Patient presents with   Lower Back - Pain   HPI:  Shelby Robinson is a 78 y.o. female who comes in today at the request of Dr. Willia Craze for planned Left L2-3 Lumbar Interlaminar epidural steroid injection with fluoroscopic guidance.  The patient has failed conservative care including home exercise, medications, time and activity modification.  This injection will be diagnostic and hopefully therapeutic.  Please see requesting physician notes for further details and justification.  Interestingly she continues to have this left-sided low back pain some referral into the abdomen and upper thigh which could be L1 related.  She also has some on the right as well.  She is having balance difficulties and she also comments on a rash that she is having all over her body that seems to be migrating.  She has had workup through her primary care physician and dermatology and really still no resolution on this rash.  She does have 12 allergies and multiple pain complaints not sure if history of fibromyalgia or central sensitization pain syndrome.   ROS Otherwise per HPI.  Assessment & Plan: Visit Diagnoses:    ICD-10-CM   1. Lumbar radiculopathy  M54.16 XR C-ARM NO REPORT    Epidural Steroid injection    methylPREDNISolone acetate (DEPO-MEDROL) injection 80 mg      Plan: No additional findings.   Meds & Orders:  Meds ordered this encounter  Medications   methylPREDNISolone acetate (DEPO-MEDROL) injection 80 mg    Orders Placed This Encounter  Procedures   XR C-ARM NO REPORT   Epidural Steroid injection    Follow-up: Return for visit to requesting provider as needed.   Procedures: No procedures performed  Lumbar Epidural Steroid Injection - Interlaminar Approach with Fluoroscopic  Guidance  Patient: Shelby Robinson      Date of Birth: [birth year] MRN: [id number] PCP: Lavonda Jumbo, PA-C      Visit Date: 08/20/2023   Universal Protocol:     Consent Given By: the patient  Position: PRONE  Additional Comments: Vital signs were monitored before and after the procedure. Patient was prepped and draped in the usual sterile fashion. The correct patient, procedure, and site was verified.   Injection Procedure Details:   Procedure diagnoses: Lumbar radiculopathy [M54.16]   Meds Administered:  Meds ordered this encounter  Medications   methylPREDNISolone acetate (DEPO-MEDROL) injection 80 mg     Laterality: Left  Location/Site:  L2-3  Needle: 3.5 in., 20 ga. Tuohy  Needle Placement: Paramedian epidural  Findings:   -Comments: Excellent flow of contrast into the epidural space.  Procedure Details: Using a paramedian approach from the side mentioned above, the region overlying the inferior lamina was localized under fluoroscopic visualization and the soft tissues overlying this structure were infiltrated with 4 ml. of 1% Lidocaine without Epinephrine. The Tuohy needle was inserted into the epidural space using a paramedian approach.   The epidural space was localized using loss of resistance along with counter oblique bi-planar fluoroscopic views.  After negative aspirate for air, blood, and CSF, a 2 ml. volume of Isovue-250 was injected into the epidural space and the flow of contrast was observed. Radiographs were obtained for documentation purposes.    The injectate was administered into the level noted above.   Additional Comments:  No  complications occurred Dressing: 2 x 2 sterile gauze and Band-Aid    Post-procedure details: Patient was observed during the procedure. Post-procedure instructions were reviewed.  Patient left the clinic in stable condition.   Clinical History: EXAM: MRI LUMBAR SPINE WITHOUT CONTRAST    TECHNIQUE: Multiplanar, multisequence MR imaging of the lumbar spine was performed. No intravenous contrast was administered.   COMPARISON:  MRI L Spine 02/05/18   FINDINGS: Segmentation:  Standard.   Alignment: There is mild stepwise retrolisthesis of L1 on L2 and L2 on L3.   Vertebrae: There are severe reactive degenerative endplate marrow changes at L1-L2 on the left. Chronic pars defects at L5.   Conus medullaris and cauda equina: Conus extends to the L1-L2 disc space level. Conus and cauda equina appear normal. 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   Paraspinal and other soft tissues: T2 hyperintense lesion along the interpolar left kidney favored to represent a renal cyst. Atrophy of the psoas musculature bilaterally.   Disc levels:   T11-T12: Only imaged in the sagittal plane. There is a small disc bulge. There is no spinal canal stenosis. No neural foraminal stenosis.   T12-L1: No significant disc bulge. Mild bilateral facet degenerative change. No spinal canal stenosis. Neural foraminal stenosis.   L1-L2: Moderate disc loss. Circumferential disc bulge with a superimposed left paracentral disc protrusion. There is mild-to-moderate spinal canal narrowing with narrowing of the left lateral recess. Mild right and moderate left neuroforaminal stenosis. Mild to moderate bilateral facet degenerative change. These findings have progressed compared to 2019.   L2-L3: Moderate disc space loss. Moderate bilateral facet degenerative change. Ligamentum flavum hypertrophy. Moderate to severe spinal canal narrowing. Mild right and mild-to-moderate left neural foraminal narrowing.These findings are unchnaged compared to 2019.   L3-L4: Moderate spinal canal stenosis. Moderate bilateral facet degenerative change. Eccentric right disc bulge. Mild-to-moderate right and mild left neural foraminal narrowing. These findings are unchanged compared to 2019.   L4-L5: Circumferential disc  bulge with a superimposed right paracentral disc protrusion fissure. There is narrowing of the right lateral recess. Mild overall spinal canal narrowing. Mild-to-moderate right and mild left neural foraminal narrowing.There is a 5 mm intraspinal synovial cyst associated with the left facet joint, which is new from prior. Compared to prior exam there is interval increase in the degree of interspinous degenerative change.   L5-S1: Severe bilateral facet degenerative change. No significant disc bulge. Unchanged perineural cyst in the right neuroforamen. Persistent mass effect on the exiting right L5 nerve root from an eccentric right disc bulge. This is unchanged from prior exam.   IMPRESSION: 1. 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 There is associated mild-to-moderate spinal canal stenosis and moderate left neural foraminal stenosis at this level. 2. Compared to prior exam there is interval increase in the degree of interspinous degenerative change at L4-L5. 3. Otherwise there is no significant interval change compared to 2019 with persistent moderate to severe spinal canal narrowing at L2-L3 and moderate spinal canal narrowing at L3-L4     Electronically Signed   By: Lorenza Cambridge M.D.   On: 12/04/2022 08:27     Objective:  VS:  HT:    WT:   BMI:     BP:131/78  HR:75bpm  TEMP: ( )  RESP:  Physical Exam Vitals and nursing note reviewed.  Constitutional:      General: She is not in acute distress.    Appearance: Normal appearance.  She is not ill-appearing.  HENT:     Head: Normocephalic and atraumatic.     Right Ear: External ear normal.     Left Ear: External ear normal.  Eyes:     Extraocular Movements: Extraocular movements intact.  Cardiovascular:     Rate and Rhythm: Normal rate.     Pulses: Normal pulses.  Pulmonary:     Effort: Pulmonary effort is normal. No  respiratory distress.  Abdominal:     General: There is no distension.     Palpations: Abdomen is soft.  Musculoskeletal:        General: Tenderness present.     Cervical back: Neck supple.     Right lower leg: No edema.     Left lower leg: No edema.     Comments: Patient has good distal strength with no pain over the greater trochanters.  No clonus or focal weakness.  Ambulates with a cane.  Skin:    Findings: No erythema, lesion or rash.  Neurological:     General: No focal deficit present.     Mental Status: She is alert and oriented to person, place, and time.     Cranial Nerves: No cranial nerve deficit.     Sensory: No sensory deficit.     Motor: Weakness present. No abnormal muscle tone.     Coordination: Coordination normal.     Gait: Gait abnormal.  Psychiatric:        Mood and Affect: Mood normal.        Behavior: Behavior normal.      Imaging: No results found.

## 2023-08-20 NOTE — Patient Instructions (Signed)

## 2023-08-20 NOTE — Progress Notes (Signed)
Functional Pain Scale - descriptive words and definitions  Intense (8)    Cannot complete any ADLs without much assistance/cannot concentrate/conversation is difficult/unable to sleep and unable to use distraction. Severe range order  Average Pain 9-10   +Driver, -BT, -Dye Allergies.  Lower back pain on left side that radiates into the left abdomen,like a "jabbing" pain. Balance has been off.

## 2023-08-20 NOTE — Procedures (Signed)
Lumbar Epidural Steroid Injection - Interlaminar Approach with Fluoroscopic Guidance  Patient: Shelby Robinson      Date of Birth: 1946-09-05 MRN: [id number] PCP: Lavonda Jumbo, PA-C      Visit Date: 08/20/2023   Universal Protocol:     Consent Given By: the patient  Position: PRONE  Additional Comments: Vital signs were monitored before and after the procedure. Patient was prepped and draped in the usual sterile fashion. The correct patient, procedure, and site was verified.   Injection Procedure Details:   Procedure diagnoses: Lumbar radiculopathy [M54.16]   Meds Administered:  Meds ordered this encounter  Medications   methylPREDNISolone acetate (DEPO-MEDROL) injection 80 mg     Laterality: Left  Location/Site:  L2-3  Needle: 3.5 in., 20 ga. Tuohy  Needle Placement: Paramedian epidural  Findings:   -Comments: Excellent flow of contrast into the epidural space.  Procedure Details: Using a paramedian approach from the side mentioned above, the region overlying the inferior lamina was localized under fluoroscopic visualization and the soft tissues overlying this structure were infiltrated with 4 ml. of 1% Lidocaine without Epinephrine. The Tuohy needle was inserted into the epidural space using a paramedian approach.   The epidural space was localized using loss of resistance along with counter oblique bi-planar fluoroscopic views.  After negative aspirate for air, blood, and CSF, a 2 ml. volume of Isovue-250 was injected into the epidural space and the flow of contrast was observed. Radiographs were obtained for documentation purposes.    The injectate was administered into the level noted above.   Additional Comments:  No complications occurred Dressing: 2 x 2 sterile gauze and Band-Aid    Post-procedure details: Patient was observed during the procedure. Post-procedure instructions were reviewed.  Patient left the clinic in stable condition.

## 2023-08-21 ENCOUNTER — Ambulatory Visit: Payer: PPO | Attending: Cardiovascular Disease

## 2023-08-21 DIAGNOSIS — I351 Nonrheumatic aortic (valve) insufficiency: Secondary | ICD-10-CM

## 2023-08-21 DIAGNOSIS — I10 Essential (primary) hypertension: Secondary | ICD-10-CM

## 2023-08-21 DIAGNOSIS — Z79899 Other long term (current) drug therapy: Secondary | ICD-10-CM

## 2023-08-21 LAB — BASIC METABOLIC PANEL
BUN/Creatinine Ratio: 17 (ref 12–28)
BUN: 19 mg/dL (ref 8–27)
CO2: 21 mmol/L (ref 20–29)
Calcium: 9.7 mg/dL (ref 8.7–10.3)
Chloride: 103 mmol/L (ref 96–106)
Creatinine, Ser: 1.14 mg/dL — ABNORMAL HIGH (ref 0.57–1.00)
Glucose: 141 mg/dL — ABNORMAL HIGH (ref 70–99)
Potassium: 4.4 mmol/L (ref 3.5–5.2)
Sodium: 137 mmol/L (ref 134–144)
eGFR: 49 mL/min/{1.73_m2} — ABNORMAL LOW (ref >59)

## 2023-08-23 ENCOUNTER — Ambulatory Visit (HOSPITAL_COMMUNITY): Payer: PPO | Attending: Cardiology

## 2023-08-23 DIAGNOSIS — Z79899 Other long term (current) drug therapy: Secondary | ICD-10-CM | POA: Insufficient documentation

## 2023-08-23 DIAGNOSIS — I351 Nonrheumatic aortic (valve) insufficiency: Secondary | ICD-10-CM | POA: Insufficient documentation

## 2023-08-23 DIAGNOSIS — I10 Essential (primary) hypertension: Secondary | ICD-10-CM | POA: Insufficient documentation

## 2023-08-23 LAB — ECHOCARDIOGRAM COMPLETE
Area-P 1/2: 3.48 cm2
MV M vel: 5.34 m/s
MV Peak grad: 114.1 mmHg
P 1/2 time: 635 msec
Radius: 0.3 cm
S' Lateral: 2.7 cm

## 2023-11-05 ENCOUNTER — Telehealth (HOSPITAL_COMMUNITY): Payer: Self-pay

## 2023-11-05 NOTE — Telephone Encounter (Signed)
Ok per Dr. Corliss Skains for sacroplasty. Called pt and she isn't sure if she is supposed to come here or to a different specialist bc of her Tarlov cysts. I have reached out to [facility] for clarification. I will call pt back after I speak to [facility]. She agreed with this plan. AB

## 2023-11-06 ENCOUNTER — Other Ambulatory Visit (HOSPITAL_COMMUNITY): Payer: Self-pay | Admitting: Interventional Radiology

## 2023-11-06 DIAGNOSIS — M8448XA Pathological fracture, other site, initial encounter for fracture: Secondary | ICD-10-CM

## 2023-11-07 ENCOUNTER — Encounter (HOSPITAL_COMMUNITY): Payer: Self-pay | Admitting: Interventional Radiology

## 2023-11-07 ENCOUNTER — Other Ambulatory Visit (HOSPITAL_COMMUNITY): Payer: Self-pay | Admitting: Interventional Radiology

## 2023-11-07 ENCOUNTER — Ambulatory Visit (HOSPITAL_COMMUNITY)
Admission: RE | Admit: 2023-11-07 | Discharge: 2023-11-07 | Disposition: A | Discharge status: Home / Self Care | Payer: PPO | Source: Ambulatory Visit | Attending: Interventional Radiology | Admitting: Interventional Radiology

## 2023-11-07 DIAGNOSIS — M8448XA Pathological fracture, other site, initial encounter for fracture: Secondary | ICD-10-CM

## 2023-11-07 DIAGNOSIS — S22080A Wedge compression fracture of T11-T12 vertebra, initial encounter for closed fracture: Secondary | ICD-10-CM

## 2023-11-08 HISTORY — PX: IR RADIOLOGIST EVAL & MGMT: IMG5224

## 2023-11-12 ENCOUNTER — Other Ambulatory Visit (HOSPITAL_COMMUNITY): Payer: Self-pay | Admitting: Interventional Radiology

## 2023-11-12 DIAGNOSIS — M8448XA Pathological fracture, other site, initial encounter for fracture: Secondary | ICD-10-CM

## 2023-11-12 DIAGNOSIS — S22080A Wedge compression fracture of T11-T12 vertebra, initial encounter for closed fracture: Secondary | ICD-10-CM

## 2023-11-18 ENCOUNTER — Telehealth (HOSPITAL_COMMUNITY): Payer: Self-pay

## 2023-11-18 NOTE — Telephone Encounter (Signed)
Returned pt's call, no answer, left vm. AB  

## 2023-11-21 ENCOUNTER — Other Ambulatory Visit: Payer: Self-pay | Admitting: Radiology

## 2023-11-21 DIAGNOSIS — Z8781 Personal history of (healed) traumatic fracture: Secondary | ICD-10-CM

## 2023-11-25 ENCOUNTER — Other Ambulatory Visit: Payer: Self-pay | Admitting: Student

## 2023-11-26 ENCOUNTER — Ambulatory Visit (HOSPITAL_COMMUNITY)
Admission: RE | Admit: 2023-11-26 | Discharge: 2023-11-26 | Disposition: A | Discharge status: Home / Self Care | Payer: PPO | Source: Ambulatory Visit | Attending: Interventional Radiology | Admitting: Interventional Radiology

## 2023-11-26 ENCOUNTER — Other Ambulatory Visit (HOSPITAL_COMMUNITY): Payer: Self-pay | Admitting: Interventional Radiology

## 2023-11-26 ENCOUNTER — Other Ambulatory Visit: Payer: Self-pay

## 2023-11-26 ENCOUNTER — Encounter (HOSPITAL_COMMUNITY): Payer: Self-pay

## 2023-11-26 DIAGNOSIS — M8008XA Age-related osteoporosis with current pathological fracture, vertebra(e), initial encounter for fracture: Secondary | ICD-10-CM | POA: Insufficient documentation

## 2023-11-26 DIAGNOSIS — M8448XA Pathological fracture, other site, initial encounter for fracture: Secondary | ICD-10-CM

## 2023-11-26 DIAGNOSIS — S22080A Wedge compression fracture of T11-T12 vertebra, initial encounter for closed fracture: Secondary | ICD-10-CM

## 2023-11-26 DIAGNOSIS — I10 Essential (primary) hypertension: Secondary | ICD-10-CM | POA: Diagnosis not present

## 2023-11-26 DIAGNOSIS — Z8781 Personal history of (healed) traumatic fracture: Secondary | ICD-10-CM

## 2023-11-26 DIAGNOSIS — X58XXXA Exposure to other specified factors, initial encounter: Secondary | ICD-10-CM | POA: Insufficient documentation

## 2023-11-26 HISTORY — PX: IR KYPHO THORACIC WITH BONE BIOPSY: IMG5518

## 2023-11-26 HISTORY — PX: IR SACROPLASTY BILATERAL: IMG5561

## 2023-11-26 LAB — BASIC METABOLIC PANEL
Anion gap: 10 (ref 5–15)
BUN: 19 mg/dL (ref 8–23)
CO2: 21 mmol/L — ABNORMAL LOW (ref 22–32)
Calcium: 9 mg/dL (ref 8.9–10.3)
Chloride: 109 mmol/L (ref 98–111)
Creatinine, Ser: 1.12 mg/dL — ABNORMAL HIGH (ref 0.44–1.00)
GFR, Estimated: 50 mL/min — ABNORMAL LOW (ref >60)
Glucose, Bld: 105 mg/dL — ABNORMAL HIGH (ref 70–99)
Potassium: 3.8 mmol/L (ref 3.5–5.1)
Sodium: 140 mmol/L (ref 135–145)

## 2023-11-26 LAB — PROTIME-INR
INR: 1.1 (ref 0.8–1.2)
Prothrombin Time: 14.4 s (ref 11.4–15.2)

## 2023-11-26 LAB — CBC
HCT: 40.3 % (ref 36.0–46.0)
Hemoglobin: 13.3 g/dL (ref 12.0–15.0)
MCH: 31.3 pg (ref 26.0–34.0)
MCHC: 33 g/dL (ref 30.0–36.0)
MCV: 94.8 fL (ref 80.0–100.0)
Platelets: 259 10*3/uL (ref 150–400)
RBC: 4.25 MIL/uL (ref 3.87–5.11)
RDW: 13.2 % (ref 11.5–15.5)
WBC: 8.3 10*3/uL (ref 4.0–10.5)
nRBC: 0 % (ref 0.0–0.2)

## 2023-11-26 LAB — GLUCOSE, CAPILLARY: Glucose-Capillary: 98 mg/dL (ref 70–99)

## 2023-11-26 MED ORDER — BUPIVACAINE HCL (PF) 0.25 % IJ SOLN
INTRAMUSCULAR | Status: AC
  Filled 2023-11-26: qty 30

## 2023-11-26 MED ORDER — MIDAZOLAM HCL 2 MG/2ML IJ SOLN
INTRAMUSCULAR | Status: AC
  Filled 2023-11-26: qty 4

## 2023-11-26 MED ORDER — ONDANSETRON HCL 4 MG PO TABS
4.0000 mg | ORAL_TABLET | ORAL | Status: AC
  Administered 2023-11-26: 4 mg via ORAL
  Filled 2023-11-26 (×2): qty 1

## 2023-11-26 MED ORDER — BUPIVACAINE HCL (PF) 0.5 % IJ SOLN
INTRAMUSCULAR | Status: AC
  Filled 2023-11-26: qty 30

## 2023-11-26 MED ORDER — TOBRAMYCIN SULFATE 1.2 G IJ SOLR: 1.2000 g | INTRAMUSCULAR | Status: DC

## 2023-11-26 MED ORDER — CEFAZOLIN SODIUM-DEXTROSE 2-4 GM/100ML-% IV SOLN
INTRAVENOUS | Status: AC
  Filled 2023-11-26: qty 100

## 2023-11-26 MED ORDER — FENTANYL CITRATE (PF) 100 MCG/2ML IJ SOLN
INTRAMUSCULAR | Status: AC | PRN
  Administered 2023-11-26 (×3): 25 ug via INTRAVENOUS

## 2023-11-26 MED ORDER — KETOROLAC TROMETHAMINE 30 MG/ML IJ SOLN
30.0000 mg | Freq: Once | INTRAMUSCULAR | Status: AC
  Administered 2023-11-26: 30 mg via INTRAVENOUS

## 2023-11-26 MED ORDER — HYDROMORPHONE HCL 1 MG/ML IJ SOLN
INTRAMUSCULAR | Status: AC | PRN
  Administered 2023-11-26: .5 mg via INTRAVENOUS

## 2023-11-26 MED ORDER — KETOROLAC TROMETHAMINE 30 MG/ML IJ SOLN
INTRAMUSCULAR | Status: AC
  Filled 2023-11-26: qty 1

## 2023-11-26 MED ORDER — CEFAZOLIN SODIUM-DEXTROSE 2-4 GM/100ML-% IV SOLN
2.0000 g | INTRAVENOUS | Status: AC
  Administered 2023-11-26: 2 g via INTRAVENOUS

## 2023-11-26 MED ORDER — TOBRAMYCIN SULFATE 1.2 G IJ SOLR
INTRAMUSCULAR | Status: AC
  Filled 2023-11-26: qty 1.2

## 2023-11-26 MED ORDER — BUPIVACAINE HCL (PF) 0.25 % IJ SOLN
30.0000 mL | Freq: Once | INTRAMUSCULAR | Status: AC
  Administered 2023-11-26: 30 mL

## 2023-11-26 MED ORDER — SODIUM CHLORIDE 0.9 % IV SOLN: INTRAVENOUS | Status: AC

## 2023-11-26 MED ORDER — IOHEXOL 300 MG/ML  SOLN: 50.0000 mL | Freq: Once | INTRAMUSCULAR | Status: DC | PRN

## 2023-11-26 MED ORDER — MIDAZOLAM HCL 2 MG/2ML IJ SOLN
INTRAMUSCULAR | Status: AC | PRN
Start: 2023-11-26 — End: 2023-11-26
  Administered 2023-11-26: .5 mg via INTRAVENOUS
  Administered 2023-11-26 (×2): 1 mg via INTRAVENOUS
  Administered 2023-11-26 (×2): .5 mg via INTRAVENOUS

## 2023-11-26 MED ORDER — BUPIVACAINE HCL (PF) 0.25 % IJ SOLN
30.0000 mL | Freq: Once | INTRAMUSCULAR | Status: AC
  Administered 2023-11-26: 10 mL

## 2023-11-26 MED ORDER — FENTANYL CITRATE (PF) 100 MCG/2ML IJ SOLN
INTRAMUSCULAR | Status: AC
  Filled 2023-11-26: qty 4

## 2023-11-26 MED ORDER — SODIUM CHLORIDE 0.9% FLUSH: 10.0000 mL | Freq: Two times a day (BID) | INTRAVENOUS | Status: DC

## 2023-11-26 MED ORDER — HYDROMORPHONE HCL 1 MG/ML IJ SOLN
INTRAMUSCULAR | Status: AC
  Filled 2023-11-26: qty 1

## 2023-11-26 NOTE — Discharge Instructions (Addendum)
1.  No stooping, bending or lifting weights above 10 pounds for 2 weeks.  2.  Use a walker to ambulate for 2 weeks.  3.  No  driving for 2 weeks.  4.  Follow-up with referring MD in 2 weeks.

## 2023-11-26 NOTE — Progress Notes (Cosign Needed Addendum)
   IR Note  Status post T12 balloon kyphoplasty. Status post sacroplasty bilateral approach. Focal extrusion of methylmethacrylate through the anterior fracture cleft of T12 compression into the adjacent prevertebral space  which remained contained.  Pt had done well after procedure Did complain of back pain that she wanted meds for Allergic to Codeine  Ordered Toradol 30 mg IV Seemed to help some Pt was ready for DC - but developed Nausea No vomit--- but dry heave  I asked RN to get pt back to bed and would keep another hour of observation VS 114/69; P 73; O2 sat 96 % RA  Stopped in to see pt just now Still nauseated but minimally better IV was removed in preparation for DC Ordered Zofran 4 mg po asap  Discussed with Dr Corliss Skains No change in plan for now  Will check with RN after another 30- 40 minutes

## 2023-11-26 NOTE — Procedures (Addendum)
INR.  Status post T12 balloon kyphoplasty.  Status post sacroplasty bilateral approach.  Focal extrusion of methylmethacrylate through the anterior fracture cleft of T12 compression into the adjacent prevertebral space  which remained contained.  The patient tolerated the procedure well. Moving both legs equally.  Fatima Sanger MD

## 2023-11-26 NOTE — Progress Notes (Signed)
Patient was about to get in wheelchair and became sick and vomitted. Beckey Downing came to see patient and stated we should just keep patient 1 additional hour .

## 2023-11-26 NOTE — H&P (Signed)
Chief Complaint: Patient was seen in consultation today for Thoracic 12 and Sacral 1 vertebroplasty/kyphoplasty at the request of Dr Bevely Palmer   Supervising Physician: Julieanne Cotton  Patient Status: [facility] - Out-pt  History of Present Illness: Shelby Robinson is a 77 y.o. female   FULL Code status per pt [name] at home 5 weeks ago Has had persistent back pain Meds - no real relief Still with 8/10 pain  Was seen in consultation with Dr Corliss Skains 11/07/23 ASSESSMENT AND PLAN: Findings of the recent MRI of the sacrum and coccyx October 28, 2023, and MRI of the lumbosacral spine of October 14, 2023 were reviewed with patient and also the patient's daughter. Brought to their attention was the presence of a superior endplate compression fracture at T12 with mild loss of vertebral body height with mild retropulsion. Also discussed was the abnormal signal in the sacrum more so on the right side most consistent with sacral insufficiency fractures and probably responsible for her severe right hip pain sitting and standing. The option of vertebral body augmentation at T12, and also at S1 to alleviate pain, and also to prevent further worsening of the compression fracture at T12 was discussed in detail.  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    Constipation    Diabetes mellitus without complication (HCC)    GERD (gastroesophageal reflux disease)    Hypertension    Hyperthyroidism    Lumbar spondylosis    Osteoarthritis of lumbar spine    Synovitis of hip    Tarlov cyst     Past Surgical History:  Procedure Laterality Date   cataract     ELBOW SURGERY Right    HIP SURGERY     Bilateral THR   IR RADIOLOGIST EVAL & MGMT  11/08/2023   TOTAL HIP ARTHROPLASTY     VAGINAL HYSTERECTOMY       Allergies: Angiotensin i, human [angiotensin]; Flagyl [metronidazole]; Codeine; Nalbuphine; Ace inhibitors; Angiotensin ii; Angiotensin receptor blockers; Chicken-peas-carrots [alitraq]; Other; Tree extract; Cottonseed oil; and Nickel  Medications: Prior to Admission medications   Medication Sig Start Date End Date Taking? Authorizing Provider  amLODipine (NORVASC) 5 MG tablet Take 1 tablet by mouth daily. 01/30/23  Yes [provider]  atorvastatin (LIPITOR) 10 MG tablet Take 10 mg by mouth daily.   Yes [provider]  cephALEXin (KEFLEX) 250 MG capsule Take 250 mg by mouth daily.   Yes [provider]  cetirizine (ZYRTEC) 10 MG tablet Take by mouth.   Yes [provider]  glipiZIDE (GLUCOTROL XL) 5 MG 24 hr tablet Take 1 tablet by mouth daily. 01/30/23  Yes [provider]  metoprolol (TOPROL-XL) 200 MG 24 hr tablet Take 200 mg by mouth daily.   Yes [provider]  nitrofurantoin, macrocrystal-monohydrate, (MACROBID) 100 MG capsule Take 100 mg by mouth 2 (two) times daily. 01/30/23  Yes [provider]  spironolactone (ALDACTONE) 25 MG tablet Take 1 tablet (25 mg total) by mouth daily. 08/07/23  Yes Tonny Bollman, MD     Family History  Problem Relation Age of Onset   Alzheimer's disease Mother    Stroke Father    CAD Father 15       CABG   Coarctation of the aorta Daughter     Social History  Socioeconomic History   Marital status: Married    Spouse name: Not on file   Number of children: 3   Years of education: Not on file   Highest education level: Not on file  Occupational History   Not on file  Tobacco Use   Smoking status: Never   Smokeless tobacco: Never  Vaping Use   Vaping status: Never Used  Substance and Sexual Activity   Alcohol use: Yes    Comment: rare   Drug use: No   Sexual activity: Not on file  Other Topics Concern   Not on file  Social History Narrative   Lives at home with  husband.  Two grandsons. Right handed    Social Determinants of Health   Financial Resource Strain: Not on file  Food Insecurity: Unknown (10/22/2023)   Received from [facility]   Hunger Vital Sign    Worried About Running Out of Food in the Last Year: Patient declined to answer    Ran Out of Food in the Last Year: Patient declined to answer  Transportation Needs: Not on file (10/22/2023)  Physical Activity: Not on file  Stress: Not on file  Social Connections: Not on file   Review of Systems: A 12 point ROS discussed and pertinent positives are indicated in the HPI above.  All other systems are negative.  Review of Systems  Constitutional:  Positive for activity change. Negative for fatigue and fever.  Respiratory:  Negative for cough and shortness of breath.   Cardiovascular:  Negative for chest pain.  Gastrointestinal:  Negative for abdominal pain.  Musculoskeletal:  Positive for back pain and gait problem.  Neurological:  Negative for weakness.  Psychiatric/Behavioral:  Negative for behavioral problems and confusion.     Vital Signs: BP 124/83   Pulse 69   Temp 97.8 F (36.6 C)   Resp 16   Ht 5\' 3"  (1.6 m)   Wt 163 lb (73.9 kg)   SpO2 98%   BMI 28.87 kg/m   Advance Care Plan: The advanced care plan/surrogate decision maker was discussed at the time of visit and documented in the medical record.    Physical Exam Vitals reviewed.  HENT:     Mouth/Throat:     Mouth: Mucous membranes are moist.  Cardiovascular:     Rate and Rhythm: Normal rate and regular rhythm.     Heart sounds: Normal heart sounds.  Pulmonary:     Effort: Pulmonary effort is normal.     Breath sounds: Normal breath sounds. No wheezing.  Abdominal:     Palpations: Abdomen is soft.     Tenderness: There is no abdominal tenderness.  Musculoskeletal:        General: Normal range of motion.     Comments: Low back pain  Skin:    General: Skin is warm.  Neurological:     Mental Status: She  is alert and oriented to person, place, and time.  Psychiatric:        Behavior: Behavior normal.     Imaging: IR Radiologist Eval & Mgmt  Result Date: 11/08/2023 EXAM: NEW PATIENT OFFICE VISIT CHIEF COMPLAINT: Severe lower thoracic and sacral pain. Current Pain Level: 1-10 HISTORY OF PRESENT ILLNESS: 77 year old right handed lady who has been referred for evaluation and management of lower thoracic and sacral pain. The patient is accompanied by her daughter. The patient gives a history of a fall 3 weeks ago while lying down during which she fell backwards and to the  right landing on hip and also her buttocks. Thereafter, the patient started experiencing severe pain sharp and gnawing in nature in her right hip, and also in the thoracolumbar region. She describes this pain as being severe gradually worsening to the point where she is able to ambulate only with a cane. The pain is exacerbated with patient turning or moving or standing or stooping forward. There is radiation of the sharp intermittent pain into her right upper thigh to the level of her knee anterolaterally. There is no associated autonomic dysfunction of her bowel or bladder. The patient is relieved with the patient being still with mild amelioration with over-the-counter Tylenol. She reports modest improvement with muscle relaxants as well. Presently she describes her pain to be a 8/10 almost constant. The patient's appetite is unchanged. Patient also sleeps reasonably well during the night. The patient denies any chest pains, shortness of breath, or of palpitations. She reports her breathing to be normal. Denies any wheezing, coughing or sputum production. Patient has no abdominal pain, constipation, diarrhea or melena. Denies any dysuria or frequency or polyuria. She does have intermittent stress incontinence which predates her fall. Diagnosis * : Date . * : B12 deficiency * : . * : Chronic hip pain after total replacement of right hip  joint * : . * : Constipation * : . * : Diabetes mellitus without complication (HCC) * : . * : GERD (gastroesophageal reflux disease) * : . * : Hypertension * : . * : Hyperthyroidism * : . * : Lumbar spondylosis * : . * : Osteoarthritis of lumbar spine * : . * : Synovitis of hip * : . * : Tarlov cyst * : : Past Surgical History: Procedure * : Laterality * : Date . * : cataract * : * : . * : ELBOW SURGERY * : Right * : . * : HIP SURGERY * : * : * : Bilateral THR . * : TOTAL HIP ARTHROPLASTY * : * : . * : VAGINAL HYSTERECTOMY * : * : Current Medications: Current Meds MedicationSig.amLODipine (NORVASC) 5 MG tablet take 1 tablet by mouth daily.atorvastatin (LIPITOR) 10 MG tablet take 10 mg by mouth daily.cephALEXin (KEFLEX) 250 MG capsule take 250 mg by mouth daily.cyclobenzaprine (FLEXERIL) 10 MG tablet take 1 tablet (10 mg total) by mouth 3 (three) times daily as needed for muscle spasms.fluticasone (FLONASE) 50 MCG/ACT nasal spray place 1 spray into both nostrils daily as needed for allergies or rhinitis.glipiZIDE (GLUCOTROL XL) 5 MG 24 hr tablet take 1 tablet by mouth daily.metoprolol (TOPROL-XL) 200 MG 24 hr tablet take 200 mg by mouth daily.nitrofurantoin, macrocrystal-monohydrate, (MACROBID) 100 MG capsule take 100 mg by mouth 2 (two) times daily.spironolactone (ALDACTONE) 25 MG tablet take 1 tablet (25 mg total) by mouth daily.DISCONTINUED spironolactone (ALDACTONE) 25 MG tablet TAKE 1/2 TABLET BY MOUTH DAILY Allergies: Angiotensin i, human angiotensin; Flagyl metronidazole; Codeine; Nalbuphine; Ace inhibitors; Angiotensin ii; Angiotensin receptor blockers; Chicken-peas-carrots alitraq; Other; Tree extract; Cottonseed oil; and Nickel. Socioeconomic History . * : Marital status: * : Married * : * : Spouse name: * : Not on file . * : Number of children: * : 3 . * : Years of education: * : Not on file . * : Highest education level: * : Not on file Occupational History . * : Not on file Tobacco Use . * : Smoking  status: * : Never . * : Smokeless tobacco: * : Never Vaping Use . * : Vaping  status: * : Never Used Substance and Sexual Activity . * : Alcohol use: * : Yes * : * : Comment: rare . * : Drug use: * : No . * : Sexual activity: * : Not on file Other Topics * : Concern . * : Not on file Social History Narrative * : Lives at home with husband.  Two grandsons. Right handed Food Insecurity: Not on file Transportation Needs: Not on file Physical Activity: Not on file Stress: Not on file Social Connections: Not on file Family History: The patient's family history includes Alzheimer's disease in her mother; CAD (age of onset: 56) in her father; Coarctation of the aorta in her daughter; Stroke in her father. REVIEW OF SYSTEMS: Negative unless as mentioned above. PHYSICAL EXAMINATION: The patient is alert, awake, oriented to time, place, space. She appears in moderate distress on account of the pain while sitting down in a chair, and also upon turning. Speech and comprehension intact. Neurologically, the patient has no lateralizing symptoms or signs. On palpation of the entire back, the patient demonstrates severe focal tenderness in the lower thoracic T12 region. She demonstrates significant tenderness over her right hip anterolaterally. ASSESSMENT AND PLAN: Findings of the recent MRI of the sacrum and coccyx October 28, 2023, and MRI of the lumbosacral spine of October 14, 2023 were reviewed with patient and also the patient's daughter. Brought to their attention was the presence of a superior endplate compression fracture at T12 with mild loss of vertebral body height with mild retropulsion. Also discussed was the abnormal signal in the sacrum more so on the right side most consistent with sacral insufficiency fractures and probably responsible for her severe right hip pain sitting and standing. The option of vertebral body augmentation at T12, and also at S1 to alleviate pain, and also to prevent further worsening of the  compression fracture at T12 was discussed in detail. The procedure, the reasons, and alternatives were reviewed in detail. The patient would have the procedure with conscious sedation followed by a 3 hour short-stay recovery prior to being discharged home with specific instructions. The remote risk of infection and bleeding were also reviewed. The remote risk of methylmethacrylate leakage and possible embolization were also reviewed and ways to mitigate that from happening also. Questions were answered to their satisfaction. The patient and the daughter will discuss further prior to finalizing their decision in the next 24-48 hours. Patient advised to continue using a cane and/or walker, avoid stooping, bending or lifting and turning quickly. They both leave with good understanding and agreement with the above management plan. Electronically Signed   By: Julieanne Cotton M.D.   On: 11/08/2023 11:30    Labs:  CBC: No results for input(s): "WBC", "HGB", "HCT", "PLT" in the last 8760 hours.  COAGS: No results for input(s): "INR", "APTT" in the last 8760 hours.  BMP: Recent Labs    08/21/23 0743  NA 137  K 4.4  CL 103  CO2 21  GLUCOSE 141*  BUN 19  CALCIUM 9.7  CREATININE 1.14*    LIVER FUNCTION TESTS: No results for input(s): "BILITOT", "AST", "ALT", "ALKPHOS", "PROT", "ALBUMIN" in the last 8760 hours.  TUMOR MARKERS: No results for input(s): "AFPTM", "CEA", "CA199", "CHROMGRNA" in the last 8760 hours.  Assessment and Plan:  Scheduled today for T12 VP/KP and Sacral  SP Risks and benefits of Thoracic 12 Vertebroplasty/kyphoplasty and Sacral 1 Sacroplasty were discussed with the patient including, but not limited to education regarding the natural  healing process of compression fractures without intervention, bleeding, infection, cement migration which may cause spinal cord damage, paralysis, pulmonary embolism or even death.  This interventional procedure involves the use of X-rays  and because of the nature of the planned procedure, it is possible that we will have prolonged use of X-ray fluoroscopy.  Potential radiation risks to you include (but are not limited to) the following: - A slightly elevated risk for cancer  several years later in life. This risk is typically less than 0.5% percent. This risk is low in comparison to the normal incidence of human cancer, which is 33% for women and 50% for men according to the American Cancer Society. - Radiation induced injury can include skin redness, resembling a rash, tissue breakdown / ulcers and hair loss (which can be temporary or permanent).   The likelihood of either of these occurring depends on the difficulty of the procedure and whether you are sensitive to radiation due to previous procedures, disease, or genetic conditions.   IF your procedure requires a prolonged use of radiation, you will be notified and given written instructions for further action.  It is your responsibility to monitor the irradiated area for the 2 weeks following the procedure and to notify your physician if you are concerned that you have suffered a radiation induced injury.    All of the patient's questions were answered, patient is agreeable to proceed.  Consent signed and in chart.   Thank you for this interesting consult.  I greatly enjoyed meeting Caral Johnson and look forward to participating in their care.  A copy of this report was sent to the requesting provider on this date.  Electronically Signed: Robet Leu, PA-C 11/26/2023, 7:11 AM   I spent a total of  30 Minutes   in face to face in clinical consultation, greater than 50% of which was counseling/coordinating care for T12 KP/VP and Sacral 1 SP

## 2023-12-05 ENCOUNTER — Telehealth (HOSPITAL_COMMUNITY): Payer: Self-pay | Admitting: Radiology

## 2023-12-05 NOTE — Telephone Encounter (Signed)
Pt left a VM for me to call her about her recent procedure for compression fractures in her back. She is doing better and continues to improve. She was notified to call us should she have the same pain she had before Deveshwar treated her. She agrees with this plan of care. JM

## 2023-12-15 ENCOUNTER — Other Ambulatory Visit: Payer: Self-pay | Admitting: Orthopedic Surgery

## 2023-12-17 ENCOUNTER — Emergency Department (HOSPITAL_BASED_OUTPATIENT_CLINIC_OR_DEPARTMENT_OTHER)
Admission: EM | Admit: 2023-12-17 | Discharge: 2023-12-17 | Discharge status: Against Medical Advice | Payer: PPO | Attending: Emergency Medicine | Admitting: Emergency Medicine

## 2023-12-17 ENCOUNTER — Other Ambulatory Visit: Payer: Self-pay

## 2023-12-17 ENCOUNTER — Encounter (HOSPITAL_BASED_OUTPATIENT_CLINIC_OR_DEPARTMENT_OTHER): Payer: Self-pay

## 2023-12-17 DIAGNOSIS — K5900 Constipation, unspecified: Secondary | ICD-10-CM | POA: Diagnosis not present

## 2023-12-17 DIAGNOSIS — R109 Unspecified abdominal pain: Secondary | ICD-10-CM | POA: Diagnosis present

## 2023-12-17 DIAGNOSIS — Z5321 Procedure and treatment not carried out due to patient leaving prior to being seen by health care provider: Secondary | ICD-10-CM | POA: Insufficient documentation

## 2023-12-17 LAB — COMPREHENSIVE METABOLIC PANEL
ALT: 20 U/L (ref 0–44)
AST: 22 U/L (ref 15–41)
Albumin: 4.7 g/dL (ref 3.5–5.0)
Alkaline Phosphatase: 109 U/L (ref 38–126)
Anion gap: 13 (ref 5–15)
BUN: 16 mg/dL (ref 8–23)
CO2: 20 mmol/L — ABNORMAL LOW (ref 22–32)
Calcium: 9.9 mg/dL (ref 8.9–10.3)
Chloride: 103 mmol/L (ref 98–111)
Creatinine, Ser: 1.26 mg/dL — ABNORMAL HIGH (ref 0.44–1.00)
GFR, Estimated: 43 mL/min — ABNORMAL LOW (ref >60)
Glucose, Bld: 175 mg/dL — ABNORMAL HIGH (ref 70–99)
Potassium: 4.2 mmol/L (ref 3.5–5.1)
Sodium: 136 mmol/L (ref 135–145)
Total Bilirubin: 1.2 mg/dL (ref 0.0–1.2)
Total Protein: 7.8 g/dL (ref 6.5–8.1)

## 2023-12-17 LAB — CBC WITH DIFFERENTIAL/PLATELET
Abs Immature Granulocytes: 0.06 10*3/uL (ref 0.00–0.07)
Basophils Absolute: 0.1 10*3/uL (ref 0.0–0.1)
Basophils Relative: 0 %
Eosinophils Absolute: 0.1 10*3/uL (ref 0.0–0.5)
Eosinophils Relative: 1 %
HCT: 42.3 % (ref 36.0–46.0)
Hemoglobin: 14.3 g/dL (ref 12.0–15.0)
Immature Granulocytes: 0 %
Lymphocytes Relative: 9 %
Lymphs Abs: 1.4 10*3/uL (ref 0.7–4.0)
MCH: 30.9 pg (ref 26.0–34.0)
MCHC: 33.8 g/dL (ref 30.0–36.0)
MCV: 91.4 fL (ref 80.0–100.0)
Monocytes Absolute: 1 10*3/uL (ref 0.1–1.0)
Monocytes Relative: 6 %
Neutro Abs: 13.5 10*3/uL — ABNORMAL HIGH (ref 1.7–7.7)
Neutrophils Relative %: 84 %
Platelets: 257 10*3/uL (ref 150–400)
RBC: 4.63 MIL/uL (ref 3.87–5.11)
RDW: 12.6 % (ref 11.5–15.5)
WBC: 16 10*3/uL — ABNORMAL HIGH (ref 4.0–10.5)
nRBC: 0 % (ref 0.0–0.2)

## 2023-12-17 NOTE — ED Triage Notes (Signed)
 Pt states she has been constipated and unable to urinate. Constipation started first and now the urination, pt having pain in her abdomen with it.

## 2024-06-13 ENCOUNTER — Encounter (HOSPITAL_COMMUNITY): Payer: Self-pay | Admitting: Interventional Radiology

## 2024-07-21 ENCOUNTER — Other Ambulatory Visit: Payer: Self-pay | Admitting: Cardiovascular Disease

## 2024-07-21 DIAGNOSIS — Z79899 Other long term (current) drug therapy: Secondary | ICD-10-CM

## 2024-07-21 DIAGNOSIS — I351 Nonrheumatic aortic (valve) insufficiency: Secondary | ICD-10-CM

## 2024-07-21 DIAGNOSIS — I10 Essential (primary) hypertension: Secondary | ICD-10-CM

## 2024-08-11 ENCOUNTER — Encounter: Payer: Self-pay | Admitting: Cardiovascular Disease

## 2024-08-11 ENCOUNTER — Ambulatory Visit: Attending: Cardiovascular Disease | Admitting: Cardiovascular Disease

## 2024-08-11 VITALS — BP 112/60 | HR 60 | Ht 64.0 in | Wt 155.6 lb

## 2024-08-11 DIAGNOSIS — R079 Chest pain, unspecified: Secondary | ICD-10-CM | POA: Diagnosis not present

## 2024-08-11 DIAGNOSIS — Z79899 Other long term (current) drug therapy: Secondary | ICD-10-CM

## 2024-08-11 DIAGNOSIS — I10 Essential (primary) hypertension: Secondary | ICD-10-CM | POA: Diagnosis not present

## 2024-08-11 DIAGNOSIS — I351 Nonrheumatic aortic (valve) insufficiency: Secondary | ICD-10-CM

## 2024-08-11 DIAGNOSIS — E782 Mixed hyperlipidemia: Secondary | ICD-10-CM

## 2024-08-11 MED ORDER — AMLODIPINE BESYLATE 5 MG PO TABS: 5.0000 mg | ORAL_TABLET | Freq: Every day | ORAL | 3 refills | Status: AC

## 2024-08-11 MED ORDER — SPIRONOLACTONE 25 MG PO TABS: 25.0000 mg | ORAL_TABLET | Freq: Every day | ORAL | 0 refills | Status: AC

## 2024-08-11 MED ORDER — ATORVASTATIN CALCIUM 10 MG PO TABS: 10.0000 mg | ORAL_TABLET | Freq: Every day | ORAL | 3 refills | Status: AC

## 2024-08-11 MED ORDER — METOPROLOL SUCCINATE ER 200 MG PO TB24: 200.0000 mg | ORAL_TABLET | Freq: Every day | ORAL | 3 refills | Status: AC

## 2024-08-11 NOTE — Patient Instructions (Addendum)
 Medication Instructions:  Refill for cardiac medication has been sent to [facility].  *If you need a refill on your cardiac medications before your next appointment, please call your pharmacy*  Lab Work: None ordered today. If you have labs (blood work) drawn today and your tests are completely normal, you will receive your results only by: MyChart Message (if you have MyChart) OR A paper copy in the mail If you have any lab test that is abnormal or we need to change your treatment, we will call you to review the results.  Testing/Procedures: Your physician has requested that you have a lexiscan myoview. For further information please visit [email or web address] Please follow instruction sheet, as given.   Follow-Up: At [facility], you and your health needs are our priority.  As part of our continuing mission to provide you with exceptional heart care, our providers are all part of one team.  This team includes your primary Cardiologist (physician) and Advanced Practice Providers or APPs (Physician Assistants and Nurse Practitioners) who all work together to provide you with the care you need, when you need it.  Your next appointment:   1 year(s)  Provider:   Ozell Fell, MD    We recommend signing up for the patient portal called MyChart.  Sign up information is provided on this After Visit Summary.  MyChart is used to connect with patients for Virtual Visits (Telemedicine).  Patients are able to view lab/test results, encounter notes, upcoming appointments, etc.  Non-urgent messages can be sent to your provider as well.   To learn more about what you can do with MyChart, go to [email or web address].   Other Instructions Lexiscan Myoview (Stress Test) Instructions  Please arrive 15 minutes prior to your appointment time for registration and insurance purposes.   The test will take approximately 3 to 4 hours to complete; you may bring reading material.   If someone comes with you to your appointment, they will need to remain in the main lobby due to limited space in the testing area. **If you are pregnant or breastfeeding, please notify the nuclear lab prior to your appointment**   How to prepare for your Myocardial Perfusion Test: Do not eat or drink 3 hours prior to your test, except you may have water. Do not consume products containing caffeine (regular or decaffeinated) 12 hours prior to your test. (ex: coffee, chocolate, sodas, tea). Do bring a list of your current medications with you.  If not listed below, you may take your medications as normal. Do wear comfortable clothes (no dresses or overalls) and walking shoes, tennis shoes preferred (No heels or open toe shoes are allowed). Do NOT wear cologne, perfume, aftershave, or lotions (deodorant is allowed). If these instructions are not followed, your test will have to be rescheduled.   Please report to [address], [place], [place] [address] for your test.  If you have questions or concerns about your appointment, you can call the Nuclear Lab at [phone].   If you cannot keep your appointment, please provide 24 hours notification to the Nuclear Lab, to avoid a possible $50 charge to your account.

## 2024-08-11 NOTE — Progress Notes (Signed)
 Cardiology Office Note:    Date:  08/11/2024   ID:  Shelby Robinson, DOB 25-Jan-1946, MRN [id number]  PCP:  Shelby Robinson   [facility] Providers Cardiologist:  Shelby Fell, MD     Referring MD: Shelby Tully CHRISTELLA, PA-C   Chief Complaint  Patient presents with   Follow-up    Aortic valve disease    History of Present Illness:    Shelby Robinson is a 78 y.o. female with a hx of hypertension and aortic insufficiency. She had been noted to have mild aortic valve insufficiency with plans for clinical follow-up.   When I saw her 1 year ago, spironolactone  was increased to help with blood pressure control.  An updated echo showed normal LVEF of 55 to 60%, normal RV function, mild mitral regurgitation, and mild aortic regurgitation.  The patient is here with her husband today. She is complaining of back pain and problems. She had vertebroplasty in December 2024 and hasn't done well since that time.  The patient brings in case reports of some complications related to vertebroplasty and had concerns about cardiac adverse events.  I reviewed this data and discussed my clinical experience with her that these complications are extremely rare.  The patient has a history of chest discomfort/angina, and has been taking amlodipine  with some improvement. She describes this as a 'heartburn' and occurs episodically, not consistently related to physical exertion.  No dyspnea, orthopnea, or PND.  She reports mild leg swelling.  Current Medications: Current Meds  Medication Sig   amoxicillin-clavulanate (AUGMENTIN) 875-125 MG tablet Take 1 tablet by mouth 2 (two) times daily.   cetirizine (ZYRTEC) 10 MG tablet Take by mouth.   cholecalciferol (VITAMIN D3) 25 MCG (1000 UNIT) tablet Take 1,000 Units by mouth.   fluticasone (FLONASE) 50 MCG/ACT nasal spray Place 1 spray into the nose.   GEMTESA 75 MG TABS Take 1 tablet by mouth daily.   glipiZIDE (GLUCOTROL XL) 5 MG 24 hr tablet Take 1 tablet  by mouth daily.   nitrofurantoin, macrocrystal-monohydrate, (MACROBID) 100 MG capsule Take 100 mg by mouth 2 (two) times daily.   [DISCONTINUED] amLODipine  (NORVASC ) 5 MG tablet Take 1 tablet by mouth daily.   [DISCONTINUED] atorvastatin  (LIPITOR) 10 MG tablet Take 10 mg by mouth daily.   [DISCONTINUED] metoprolol  (TOPROL -XL) 200 MG 24 hr tablet Take 200 mg by mouth daily.   [DISCONTINUED] spironolactone  (ALDACTONE ) 25 MG tablet TAKE 1 TABLET BY MOUTH DAILY     Allergies:   Angiotensin i, human [angiotensin]; Flagyl [metronidazole]; Codeine; Nalbuphine; Ace inhibitors; Angiotensin ii; Angiotensin receptor blockers; Chicken-peas-carrots [alitraq]; Other; Tree extract; Cottonseed oil; and Nickel   ROS:   Please see the history of present illness.    All other systems reviewed and are negative.  EKGs/Labs/Other Studies Reviewed:    The following studies were reviewed today: Cardiac Studies & Procedures   ______________________________________________________________________________________________     ECHOCARDIOGRAM  ECHOCARDIOGRAM COMPLETE 08/23/2023  Narrative ECHOCARDIOGRAM REPORT    Patient Name:   Shelby Robinson Date of Exam: 08/23/2023 Medical Rec #:  [id number]     Height:       63.0 in Accession #:    [id number]    Weight:       165.6 lb Date of Birth:  04-25-1946     BSA:          1.785 m Patient Age:    77 years      BP:  110/80 mmHg Patient Gender: F             HR:           61 bpm. Exam Location:  [address]  Procedure: 2D Echo, Cardiac Doppler, Color Doppler and 3D Echo  Indications:    I35.1 Nonrheumatic aortic (valve) insufficiency; R06.02 SOB  History:        Patient has prior history of Echocardiogram examinations, most recent 10/18/2016. Risk Factors:Hypertension, Diabetes and Dyslipidemia. Hyperthyroidism. Bruit.  Sonographer:    Jon Hacker RCS Referring Phys: [phone] Shelby Robinson  IMPRESSIONS   1. Left ventricular ejection fraction, by  estimation, is 55 to 60%. Left ventricular ejection fraction by 3D volume is 57 %. The left ventricle has normal function. The left ventricle has no regional wall motion abnormalities. Left ventricular diastolic parameters are consistent with Grade I diastolic dysfunction (impaired relaxation). 2. Right ventricular systolic function is normal. The right ventricular size is normal. 3. The mitral valve is normal in structure. Mild mitral valve regurgitation. No evidence of mitral stenosis. 4. The aortic valve is normal in structure. Aortic valve regurgitation is mild. No aortic stenosis is present. 5. The inferior vena cava is normal in size with greater than 50% respiratory variability, suggesting right atrial pressure of 3 mmHg.  FINDINGS Left Ventricle: Left ventricular ejection fraction, by estimation, is 55 to 60%. Left ventricular ejection fraction by 3D volume is 57 %. The left ventricle has normal function. The left ventricle has no regional wall motion abnormalities. The left ventricular internal cavity size was normal in size. There is no left ventricular hypertrophy. Left ventricular diastolic parameters are consistent with Grade I diastolic dysfunction (impaired relaxation). Normal left ventricular filling pressure.  Right Ventricle: The right ventricular size is normal. No increase in right ventricular wall thickness. Right ventricular systolic function is normal.  Left Atrium: Left atrial size was normal in size.  Right Atrium: Right atrial size was normal in size.  Pericardium: There is no evidence of pericardial effusion.  Mitral Valve: The mitral valve is normal in structure. Mild mitral valve regurgitation. No evidence of mitral valve stenosis.  Tricuspid Valve: The tricuspid valve is normal in structure. Tricuspid valve regurgitation is trivial. No evidence of tricuspid stenosis.  Aortic Valve: The aortic valve is normal in structure. Aortic valve regurgitation is mild. Aortic  regurgitation PHT measures 635 msec. No aortic stenosis is present.  Pulmonic Valve: The pulmonic valve was normal in structure. Pulmonic valve regurgitation is not visualized. No evidence of pulmonic stenosis.  Aorta: The aortic root is normal in size and structure.  Venous: The inferior vena cava is normal in size with greater than 50% respiratory variability, suggesting right atrial pressure of 3 mmHg.  IAS/Shunts: No atrial level shunt detected by color flow Doppler.   LEFT VENTRICLE PLAX 2D LVIDd:         3.80 cm         Diastology LVIDs:         2.70 cm         LV e' medial:    5.33 cm/s LV PW:         1.10 cm         LV E/e' medial:  11.8 LV IVS:        0.80 cm         LV e' lateral:   8.16 cm/s LVOT diam:     2.00 cm         LV E/e'  lateral: 7.7 LV SV:         79 LV SV Index:   44 LVOT Area:     3.14 cm        3D Volume EF LV 3D EF:    Left ventricul ar ejection fraction by 3D volume is 57 %.  3D Volume EF: 3D EF:        57 % LV EDV:       121 ml LV ESV:       52 ml LV SV:        69 ml  RIGHT VENTRICLE RV Basal diam:  3.20 cm RV S prime:     8.81 cm/s TAPSE (M-mode): 1.6 cm RVSP:           15.1 mmHg  LEFT ATRIUM             Index        RIGHT ATRIUM           Index LA diam:        2.90 cm 1.63 cm/m   RA Pressure: 3.00 mmHg LA Vol (A2C):   25.7 ml 14.40 ml/m  RA Area:     13.40 cm LA Vol (A4C):   22.2 ml 12.44 ml/m  RA Volume:   29.70 ml  16.64 ml/m LA Biplane Vol: 24.6 ml 13.78 ml/m AORTIC VALVE LVOT Vmax:   107.00 cm/s LVOT Vmean:  67.800 cm/s LVOT VTI:    0.250 m AI PHT:      635 msec  AORTA Ao Root diam: 3.10 cm Ao Asc diam:  3.30 cm  MITRAL VALVE                  TRICUSPID VALVE MV Area (PHT): 3.48 cm       TR Peak grad:   12.1 mmHg MV Decel Time: 218 msec       TR Vmax:        174.00 cm/s MR Peak grad:    114.1 mmHg   Estimated RAP:  3.00 mmHg MR Mean grad:    75.0 mmHg    RVSP:           15.1 mmHg MR Vmax:         534.00 cm/s MR  Vmean:        399.0 cm/s   SHUNTS MR PISA:         0.57 cm     Systemic VTI:  0.25 m MR PISA Eff ROA: 4 mm        Systemic Diam: 2.00 cm MR PISA Radius:  0.30 cm MV E velocity: 62.90 cm/s MV A velocity: 125.00 cm/s MV E/A ratio:  0.50  Wilbert Bihari MD Electronically signed by Wilbert Bihari MD Signature Date/Time: 08/23/2023/4:08:32 PM    Final    MONITORS  CARDIAC EVENT MONITOR 10/24/2021  Narrative Normal sinus rhythm No sustained arrhythmias       ______________________________________________________________________________________________      EKG:   EKG Interpretation Date/Time:  Tuesday August 11 2024 14:38:50 EDT Ventricular Rate:  63 PR Interval:  178 QRS Duration:  70 QT Interval:  426 QTC Calculation: 435 R Axis:   -12  Text Interpretation: Normal sinus rhythm Minimal voltage criteria for LVH, may be normal variant ( R in aVL ) Anterior infarct , age undetermined When compared with ECG of 07-Aug-2023 09:11, No significant change was found Confirmed by Wonda Sharper [phone]) on 08/11/2024 2:51:36 PM    Recent Labs: 12/17/2023: ALT  20; BUN 16; Creatinine, Ser 1.26; Hemoglobin 14.3; Platelets 257; Potassium 4.2; Sodium 136  Recent Lipid Panel No results found for: CHOL, TRIG, HDL, CHOLHDL, VLDL, LDLCALC, LDLDIRECT   Risk Assessment/Calculations:                Physical Exam:    VS:  BP 112/60   Pulse 60   Ht 5' 4 (1.626 m)   Wt 155 lb 9.6 oz (70.6 kg)   SpO2 98%   BMI 26.71 kg/m     Wt Readings from Last 3 Encounters:  08/11/24 155 lb 9.6 oz (70.6 kg)  11/26/23 163 lb (73.9 kg)  08/07/23 165 lb 9.6 oz (75.1 kg)     GEN:  Well nourished, well developed in no acute distress HEENT: Normal NECK: No JVD; No carotid bruits LYMPHATICS: No lymphadenopathy CARDIAC: RRR, no murmurs, rubs, gallops RESPIRATORY:  Clear to auscultation without rales, wheezing or rhonchi  ABDOMEN: Soft, non-tender, non-distended MUSCULOSKELETAL:  Trace bilateral pretibial edema; No deformity  SKIN: Warm and dry NEUROLOGIC:  Alert and oriented x 3 PSYCHIATRIC:  Normal affect   Assessment & Plan Nonrheumatic aortic valve insufficiency Most recent echo reviewed with normal LVEF, normal LV dimensions, and mild aortic valve insufficiency.  Recommend clinical follow-up. Essential hypertension Blood pressure is very well-controlled on amlodipine , metoprolol  succinate, and spironolactone .  Continue current management.  Most recent labs reviewed with a creatinine of 1.26 and potassium 4.2. Chest pain of uncertain etiology Patient with some typical and atypical features.  She does describe substernal burning.  Recommend a Lexiscan Myoview stress test in this patient who is unable to exercise on the treadmill with her back problems.  Continue current management. Medication management We will refill her cardiac medications including amlodipine , atorvastatin , metoprolol  succinate, and spironolactone . Mixed hyperlipidemia Recent lipids reviewed with a cholesterol 137, HDL 49, LDL 62.  Continue current management.  Treated with atorvastatin  10 mg daily.       Informed Consent   Shared Decision Making/Informed Consent The risks [chest pain, shortness of breath, cardiac arrhythmias, dizziness, blood pressure fluctuations, myocardial infarction, stroke/transient ischemic attack, nausea, vomiting, allergic reaction, radiation exposure, metallic taste sensation and life-threatening complications (estimated to be 1 in 10,000)], benefits (risk stratification, diagnosing coronary artery disease, treatment guidance) and alternatives of a nuclear stress test were discussed in detail with Ms. Clontz and she agrees to proceed.       Medication Adjustments/Labs and Tests Ordered: Current medicines are reviewed at length with the patient today.  Concerns regarding medicines are outlined above.  Orders Placed This Encounter  Procedures   MYOCARDIAL PERFUSION  IMAGING   EKG 12-Lead   Meds ordered this encounter  Medications   amLODipine  (NORVASC ) 5 MG tablet    Sig: Take 1 tablet (5 mg total) by mouth daily.    Dispense:  90 tablet    Refill:  3   metoprolol  (TOPROL -XL) 200 MG 24 hr tablet    Sig: Take 1 tablet (200 mg total) by mouth daily.    Dispense:  90 tablet    Refill:  3   spironolactone  (ALDACTONE ) 25 MG tablet    Sig: Take 1 tablet (25 mg total) by mouth daily.    Dispense:  90 tablet    Refill:  0   atorvastatin  (LIPITOR) 10 MG tablet    Sig: Take 1 tablet (10 mg total) by mouth daily.    Dispense:  90 tablet    Refill:  3    Patient Instructions  Medication Instructions:  Refill for cardiac medication has been sent to [facility].  *If you need a refill on your cardiac medications before your next appointment, please call your pharmacy*  Lab Work: None ordered today. If you have labs (blood work) drawn today and your tests are completely normal, you will receive your results only by: MyChart Message (if you have MyChart) OR A paper copy in the mail If you have any lab test that is abnormal or we need to change your treatment, we will call you to review the results.  Testing/Procedures: Your physician has requested that you have a lexiscan myoview. For further information please visit [email or web address] Please follow instruction sheet, as given.   Follow-Up: At [facility], you and your health needs are our priority.  As part of our continuing mission to provide you with exceptional heart care, our providers are all part of one team.  This team includes your primary Cardiologist (physician) and Advanced Practice Providers or APPs (Physician Assistants and Nurse Practitioners) who all work together to provide you with the care you need, when you need it.  Your next appointment:   1 year(s)  Provider:   Ozell Fell, MD    We recommend signing up for the patient portal called MyChart.   Sign up information is provided on this After Visit Summary.  MyChart is used to connect with patients for Virtual Visits (Telemedicine).  Patients are able to view lab/test results, encounter notes, upcoming appointments, etc.  Non-urgent messages can be sent to your provider as well.   To learn more about what you can do with MyChart, go to [email or web address].   Other Instructions Lexiscan Myoview (Stress Test) Instructions  Please arrive 15 minutes prior to your appointment time for registration and insurance purposes.   The test will take approximately 3 to 4 hours to complete; you may bring reading material.  If someone comes with you to your appointment, they will need to remain in the main lobby due to limited space in the testing area. **If you are pregnant or breastfeeding, please notify the nuclear lab prior to your appointment**   How to prepare for your Myocardial Perfusion Test: Do not eat or drink 3 hours prior to your test, except you may have water. Do not consume products containing caffeine (regular or decaffeinated) 12 hours prior to your test. (ex: coffee, chocolate, sodas, tea). Do bring a list of your current medications with you.  If not listed below, you may take your medications as normal. Do wear comfortable clothes (no dresses or overalls) and walking shoes, tennis shoes preferred (No heels or open toe shoes are allowed). Do NOT wear cologne, perfume, aftershave, or lotions (deodorant is allowed). If these instructions are not followed, your test will have to be rescheduled.   Please report to [address] [place], [place] [address] for your test.  If you have questions or concerns about your appointment, you can call the Nuclear Lab at [phone].   If you cannot keep your appointment, please provide 24 hours notification to the Nuclear Lab, to avoid a possible $50 charge to your account.     Signed, Shelby Fell, MD  08/11/2024 5:25 PM    [facility]

## 2024-08-11 NOTE — Assessment & Plan Note (Signed)
 Most recent echo reviewed with normal LVEF, normal LV dimensions, and mild aortic valve insufficiency.  Recommend clinical follow-up.

## 2024-08-14 ENCOUNTER — Encounter (HOSPITAL_COMMUNITY): Payer: Self-pay | Admitting: *Deleted

## 2024-08-25 ENCOUNTER — Other Ambulatory Visit: Payer: Self-pay | Admitting: Cardiovascular Disease

## 2024-08-25 DIAGNOSIS — R079 Chest pain, unspecified: Secondary | ICD-10-CM

## 2024-08-26 ENCOUNTER — Ambulatory Visit (HOSPITAL_COMMUNITY)
Admission: RE | Admit: 2024-08-26 | Discharge: 2024-08-26 | Disposition: A | Discharge status: Home / Self Care | Source: Ambulatory Visit | Attending: Cardiovascular Disease | Admitting: Cardiovascular Disease

## 2024-08-26 DIAGNOSIS — R079 Chest pain, unspecified: Secondary | ICD-10-CM | POA: Insufficient documentation

## 2024-08-26 LAB — MYOCARDIAL PERFUSION IMAGING
LV dias vol: 61 mL (ref 46–106)
LV sys vol: 10 mL (ref 3.8–5.2)
Nuc Stress EF: 84 %
Peak HR: 110 {beats}/min
Rest HR: 72 {beats}/min
Rest Nuclear Isotope Dose: 11 mCi
SDS: 1
SRS: 0
SSS: 1
ST Depression (mm): 0 mm
Stress Nuclear Isotope Dose: 32.8 mCi
TID: 1.04

## 2024-08-26 MED ORDER — TECHNETIUM TC 99M TETROFOSMIN IV KIT
32.8000 | PACK | Freq: Once | INTRAVENOUS | Status: AC | PRN
  Administered 2024-08-26: 32.8 via INTRAVENOUS

## 2024-08-26 MED ORDER — REGADENOSON 0.4 MG/5ML IV SOLN
0.4000 mg | Freq: Once | INTRAVENOUS | Status: AC
  Administered 2024-08-26: 0.4 mg via INTRAVENOUS

## 2024-08-26 MED ORDER — TECHNETIUM TC 99M TETROFOSMIN IV KIT
11.0000 | PACK | Freq: Once | INTRAVENOUS | Status: AC | PRN
  Administered 2024-08-26: 11 via INTRAVENOUS

## 2024-08-26 MED ORDER — REGADENOSON 0.4 MG/5ML IV SOLN
INTRAVENOUS | Status: AC
  Filled 2024-08-26: qty 5

## 2024-08-27 ENCOUNTER — Ambulatory Visit: Payer: Self-pay | Admitting: Cardiovascular Disease

## 2024-09-04 ENCOUNTER — Ambulatory Visit: Payer: Self-pay | Admitting: Cardiovascular Disease

## 2024-10-19 ENCOUNTER — Encounter: Payer: Self-pay | Admitting: Radiology

## 2024-11-19 ENCOUNTER — Encounter: Payer: Self-pay | Admitting: Cardiovascular Disease
